# Patient Record
Sex: Female | Born: 1945 | Race: White | Hispanic: No | Marital: Single | Smoking: Current every day smoker
Health system: Southern US, Community
[De-identification: ages and names within clinical notes are randomized; demographics above are authoritative.]

## PROBLEM LIST (undated history)

## (undated) DIAGNOSIS — C801 Malignant (primary) neoplasm, unspecified: Secondary | ICD-10-CM

## (undated) DIAGNOSIS — M8580 Other specified disorders of bone density and structure, unspecified site: Secondary | ICD-10-CM

## (undated) DIAGNOSIS — R002 Palpitations: Secondary | ICD-10-CM

## (undated) DIAGNOSIS — R319 Hematuria, unspecified: Secondary | ICD-10-CM

## (undated) DIAGNOSIS — I10 Essential (primary) hypertension: Secondary | ICD-10-CM

## (undated) DIAGNOSIS — R51 Headache: Secondary | ICD-10-CM

## (undated) DIAGNOSIS — J4520 Mild intermittent asthma, uncomplicated: Secondary | ICD-10-CM

## (undated) DIAGNOSIS — K219 Gastro-esophageal reflux disease without esophagitis: Secondary | ICD-10-CM

## (undated) DIAGNOSIS — K589 Irritable bowel syndrome without diarrhea: Secondary | ICD-10-CM

## (undated) DIAGNOSIS — R159 Full incontinence of feces: Secondary | ICD-10-CM

## (undated) DIAGNOSIS — G43909 Migraine, unspecified, not intractable, without status migrainosus: Secondary | ICD-10-CM

## (undated) DIAGNOSIS — J381 Polyp of vocal cord and larynx: Secondary | ICD-10-CM

## (undated) DIAGNOSIS — M6289 Other specified disorders of muscle: Secondary | ICD-10-CM

## (undated) DIAGNOSIS — Z789 Other specified health status: Secondary | ICD-10-CM

## (undated) DIAGNOSIS — Z8679 Personal history of other diseases of the circulatory system: Secondary | ICD-10-CM

## (undated) DIAGNOSIS — E785 Hyperlipidemia, unspecified: Secondary | ICD-10-CM

## (undated) DIAGNOSIS — E039 Hypothyroidism, unspecified: Secondary | ICD-10-CM

## (undated) DIAGNOSIS — R29898 Other symptoms and signs involving the musculoskeletal system: Secondary | ICD-10-CM

## (undated) DIAGNOSIS — K2270 Barrett's esophagus without dysplasia: Secondary | ICD-10-CM

## (undated) DIAGNOSIS — Z860100 Personal history of colon polyps, unspecified: Secondary | ICD-10-CM

## (undated) DIAGNOSIS — H8120 Vestibular neuronitis, unspecified ear: Secondary | ICD-10-CM

## (undated) DIAGNOSIS — E7800 Pure hypercholesterolemia, unspecified: Secondary | ICD-10-CM

## (undated) DIAGNOSIS — R42 Dizziness and giddiness: Secondary | ICD-10-CM

## (undated) DIAGNOSIS — N281 Cyst of kidney, acquired: Secondary | ICD-10-CM

## (undated) DIAGNOSIS — I493 Ventricular premature depolarization: Secondary | ICD-10-CM

## (undated) DIAGNOSIS — G8929 Other chronic pain: Secondary | ICD-10-CM

## (undated) DIAGNOSIS — R519 Headache, unspecified: Secondary | ICD-10-CM

## (undated) HISTORY — DX: Hematuria, unspecified: R31.9

## (undated) HISTORY — DX: Hypothyroidism, unspecified: E03.9

## (undated) HISTORY — DX: Barrett's esophagus without dysplasia: K22.70

## (undated) HISTORY — DX: Polyp of vocal cord and larynx: J38.1

## (undated) HISTORY — DX: Other chronic pain: G89.29

## (undated) HISTORY — DX: Personal history of colonic polyps: Z86.010

## (undated) HISTORY — DX: Other symptoms and signs involving the musculoskeletal system: R29.898

## (undated) HISTORY — DX: Essential (primary) hypertension: I10

## (undated) HISTORY — DX: Cyst of kidney, acquired: N28.1

## (undated) HISTORY — DX: Other specified disorders of bone density and structure, unspecified site: M85.80

## (undated) HISTORY — DX: Malignant (primary) neoplasm, unspecified: C80.1

## (undated) HISTORY — DX: Personal history of other diseases of the circulatory system: Z86.79

## (undated) HISTORY — DX: Ventricular premature depolarization: I49.3

## (undated) HISTORY — DX: Full incontinence of feces: R15.9

## (undated) HISTORY — DX: Headache: R51

## (undated) HISTORY — DX: Other specified health status: Z78.9

## (undated) HISTORY — DX: Headache, unspecified: R51.9

## (undated) HISTORY — DX: Hyperlipidemia, unspecified: E78.5

## (undated) HISTORY — DX: Personal history of colon polyps, unspecified: Z86.0100

## (undated) HISTORY — PX: TUBAL LIGATION: SHX77

## (undated) HISTORY — DX: Migraine, unspecified, not intractable, without status migrainosus: G43.909

## (undated) HISTORY — DX: Pure hypercholesterolemia, unspecified: E78.00

## (undated) HISTORY — DX: Palpitations: R00.2

## (undated) HISTORY — DX: Irritable bowel syndrome, unspecified: K58.9

## (undated) HISTORY — DX: Vestibular neuronitis, unspecified ear: H81.20

## (undated) HISTORY — PX: OOPHORECTOMY: SHX86

## (undated) HISTORY — DX: Gastro-esophageal reflux disease without esophagitis: K21.9

## (undated) HISTORY — DX: Other specified disorders of muscle: M62.89

## (undated) HISTORY — DX: Dizziness and giddiness: R42

## (undated) HISTORY — DX: Mild intermittent asthma, uncomplicated: J45.20

---
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
## 2003-01-15 HISTORY — PX: BREAST LUMPECTOMY: SHX2

## 2008-06-28 ENCOUNTER — Other Ambulatory Visit: Admission: RE | Admit: 2008-06-28 | Discharge: 2008-06-28 | Payer: Self-pay | Admitting: Family Medicine

## 2008-07-20 ENCOUNTER — Encounter: Admission: RE | Admit: 2008-07-20 | Discharge: 2008-07-20 | Payer: Self-pay | Admitting: Family Medicine

## 2009-04-14 HISTORY — PX: COLONOSCOPY: SHX174

## 2009-07-21 ENCOUNTER — Encounter: Admission: RE | Admit: 2009-07-21 | Discharge: 2009-07-21 | Payer: Self-pay | Admitting: Family Medicine

## 2010-06-27 ENCOUNTER — Other Ambulatory Visit: Payer: Self-pay | Admitting: Family Medicine

## 2010-06-27 DIAGNOSIS — Z9889 Other specified postprocedural states: Secondary | ICD-10-CM

## 2010-07-10 ENCOUNTER — Other Ambulatory Visit: Payer: Self-pay | Admitting: Family Medicine

## 2010-07-10 DIAGNOSIS — Z780 Asymptomatic menopausal state: Secondary | ICD-10-CM

## 2010-07-23 ENCOUNTER — Other Ambulatory Visit: Payer: Self-pay

## 2010-07-26 ENCOUNTER — Ambulatory Visit
Admission: RE | Admit: 2010-07-26 | Discharge: 2010-07-26 | Disposition: A | Discharge status: Home / Self Care | Payer: Medicare Other | Source: Ambulatory Visit | Attending: Family Medicine | Admitting: Family Medicine

## 2010-07-26 DIAGNOSIS — Z9889 Other specified postprocedural states: Secondary | ICD-10-CM

## 2010-07-26 DIAGNOSIS — Z780 Asymptomatic menopausal state: Secondary | ICD-10-CM

## 2010-08-06 ENCOUNTER — Other Ambulatory Visit (HOSPITAL_COMMUNITY)
Admission: RE | Admit: 2010-08-06 | Discharge: 2010-08-06 | Disposition: A | Discharge status: Home / Self Care | Payer: Medicare Other | Source: Ambulatory Visit | Attending: Family Medicine | Admitting: Family Medicine

## 2010-08-06 ENCOUNTER — Other Ambulatory Visit: Payer: Self-pay | Admitting: Family Medicine

## 2010-08-06 DIAGNOSIS — Z124 Encounter for screening for malignant neoplasm of cervix: Secondary | ICD-10-CM | POA: Insufficient documentation

## 2011-03-13 DIAGNOSIS — M79609 Pain in unspecified limb: Secondary | ICD-10-CM | POA: Diagnosis not present

## 2011-03-13 DIAGNOSIS — M7150 Other bursitis, not elsewhere classified, unspecified site: Secondary | ICD-10-CM | POA: Diagnosis not present

## 2011-03-13 DIAGNOSIS — M7730 Calcaneal spur, unspecified foot: Secondary | ICD-10-CM | POA: Diagnosis not present

## 2011-03-13 DIAGNOSIS — M7660 Achilles tendinitis, unspecified leg: Secondary | ICD-10-CM | POA: Diagnosis not present

## 2011-03-20 DIAGNOSIS — M659 Synovitis and tenosynovitis, unspecified: Secondary | ICD-10-CM | POA: Diagnosis not present

## 2011-03-20 DIAGNOSIS — M7150 Other bursitis, not elsewhere classified, unspecified site: Secondary | ICD-10-CM | POA: Diagnosis not present

## 2011-03-20 DIAGNOSIS — M65979 Unspecified synovitis and tenosynovitis, unspecified ankle and foot: Secondary | ICD-10-CM | POA: Diagnosis not present

## 2011-03-20 DIAGNOSIS — G5750 Tarsal tunnel syndrome, unspecified lower limb: Secondary | ICD-10-CM | POA: Diagnosis not present

## 2011-03-29 DIAGNOSIS — M65979 Unspecified synovitis and tenosynovitis, unspecified ankle and foot: Secondary | ICD-10-CM | POA: Diagnosis not present

## 2011-03-29 DIAGNOSIS — IMO0002 Reserved for concepts with insufficient information to code with codable children: Secondary | ICD-10-CM | POA: Diagnosis not present

## 2011-03-29 DIAGNOSIS — M659 Synovitis and tenosynovitis, unspecified: Secondary | ICD-10-CM | POA: Diagnosis not present

## 2011-06-18 DIAGNOSIS — R002 Palpitations: Secondary | ICD-10-CM | POA: Diagnosis not present

## 2011-07-01 ENCOUNTER — Other Ambulatory Visit: Payer: Self-pay | Admitting: Family Medicine

## 2011-07-01 DIAGNOSIS — Z1231 Encounter for screening mammogram for malignant neoplasm of breast: Secondary | ICD-10-CM

## 2011-07-10 DIAGNOSIS — F17200 Nicotine dependence, unspecified, uncomplicated: Secondary | ICD-10-CM | POA: Diagnosis not present

## 2011-07-10 DIAGNOSIS — R030 Elevated blood-pressure reading, without diagnosis of hypertension: Secondary | ICD-10-CM | POA: Diagnosis not present

## 2011-07-10 DIAGNOSIS — R079 Chest pain, unspecified: Secondary | ICD-10-CM | POA: Diagnosis not present

## 2011-07-10 DIAGNOSIS — I059 Rheumatic mitral valve disease, unspecified: Secondary | ICD-10-CM | POA: Diagnosis not present

## 2011-07-10 DIAGNOSIS — R002 Palpitations: Secondary | ICD-10-CM | POA: Diagnosis not present

## 2011-07-11 DIAGNOSIS — G5610 Other lesions of median nerve, unspecified upper limb: Secondary | ICD-10-CM | POA: Diagnosis not present

## 2011-07-11 DIAGNOSIS — IMO0002 Reserved for concepts with insufficient information to code with codable children: Secondary | ICD-10-CM | POA: Diagnosis not present

## 2011-07-11 DIAGNOSIS — R634 Abnormal weight loss: Secondary | ICD-10-CM | POA: Diagnosis not present

## 2011-07-11 DIAGNOSIS — G43909 Migraine, unspecified, not intractable, without status migrainosus: Secondary | ICD-10-CM | POA: Diagnosis not present

## 2011-07-11 DIAGNOSIS — G609 Hereditary and idiopathic neuropathy, unspecified: Secondary | ICD-10-CM | POA: Diagnosis not present

## 2011-07-11 DIAGNOSIS — D539 Nutritional anemia, unspecified: Secondary | ICD-10-CM | POA: Diagnosis not present

## 2011-07-11 DIAGNOSIS — R209 Unspecified disturbances of skin sensation: Secondary | ICD-10-CM | POA: Diagnosis not present

## 2011-07-12 DIAGNOSIS — R002 Palpitations: Secondary | ICD-10-CM | POA: Diagnosis not present

## 2011-07-17 DIAGNOSIS — R030 Elevated blood-pressure reading, without diagnosis of hypertension: Secondary | ICD-10-CM | POA: Diagnosis not present

## 2011-07-17 DIAGNOSIS — R079 Chest pain, unspecified: Secondary | ICD-10-CM | POA: Diagnosis not present

## 2011-07-17 DIAGNOSIS — R0602 Shortness of breath: Secondary | ICD-10-CM | POA: Diagnosis not present

## 2011-07-17 DIAGNOSIS — R002 Palpitations: Secondary | ICD-10-CM | POA: Diagnosis not present

## 2011-07-17 DIAGNOSIS — I059 Rheumatic mitral valve disease, unspecified: Secondary | ICD-10-CM | POA: Diagnosis not present

## 2011-07-25 DIAGNOSIS — R002 Palpitations: Secondary | ICD-10-CM | POA: Diagnosis not present

## 2011-07-25 DIAGNOSIS — R030 Elevated blood-pressure reading, without diagnosis of hypertension: Secondary | ICD-10-CM | POA: Diagnosis not present

## 2011-07-29 ENCOUNTER — Ambulatory Visit: Payer: Medicare Other

## 2011-08-12 ENCOUNTER — Ambulatory Visit
Admission: RE | Admit: 2011-08-12 | Discharge: 2011-08-12 | Disposition: A | Discharge status: Home / Self Care | Payer: Medicare Other | Source: Ambulatory Visit | Attending: Family Medicine | Admitting: Family Medicine

## 2011-08-12 DIAGNOSIS — Z1231 Encounter for screening mammogram for malignant neoplasm of breast: Secondary | ICD-10-CM

## 2011-08-12 DIAGNOSIS — R209 Unspecified disturbances of skin sensation: Secondary | ICD-10-CM | POA: Diagnosis not present

## 2011-08-12 DIAGNOSIS — G609 Hereditary and idiopathic neuropathy, unspecified: Secondary | ICD-10-CM | POA: Diagnosis not present

## 2011-08-12 DIAGNOSIS — G43909 Migraine, unspecified, not intractable, without status migrainosus: Secondary | ICD-10-CM | POA: Diagnosis not present

## 2011-08-23 DIAGNOSIS — R002 Palpitations: Secondary | ICD-10-CM | POA: Diagnosis not present

## 2011-08-23 DIAGNOSIS — R05 Cough: Secondary | ICD-10-CM | POA: Diagnosis not present

## 2011-08-23 DIAGNOSIS — R030 Elevated blood-pressure reading, without diagnosis of hypertension: Secondary | ICD-10-CM | POA: Diagnosis not present

## 2011-08-26 DIAGNOSIS — L821 Other seborrheic keratosis: Secondary | ICD-10-CM | POA: Diagnosis not present

## 2011-08-26 DIAGNOSIS — C44711 Basal cell carcinoma of skin of unspecified lower limb, including hip: Secondary | ICD-10-CM | POA: Diagnosis not present

## 2011-08-26 DIAGNOSIS — Z85828 Personal history of other malignant neoplasm of skin: Secondary | ICD-10-CM | POA: Diagnosis not present

## 2011-08-26 DIAGNOSIS — D485 Neoplasm of uncertain behavior of skin: Secondary | ICD-10-CM | POA: Diagnosis not present

## 2011-09-18 ENCOUNTER — Ambulatory Visit (INDEPENDENT_AMBULATORY_CARE_PROVIDER_SITE_OTHER)
Admission: RE | Admit: 2011-09-18 | Discharge: 2011-09-18 | Disposition: A | Discharge status: Home / Self Care | Payer: Medicare Other | Source: Ambulatory Visit | Attending: Internal Medicine | Admitting: Internal Medicine

## 2011-09-18 ENCOUNTER — Ambulatory Visit (INDEPENDENT_AMBULATORY_CARE_PROVIDER_SITE_OTHER): Payer: Medicare Other | Admitting: Internal Medicine

## 2011-09-18 ENCOUNTER — Encounter: Payer: Self-pay | Admitting: Internal Medicine

## 2011-09-18 VITALS — BP 142/70 | HR 92 | Temp 98.1°F | Ht 66.5 in | Wt 174.8 lb

## 2011-09-18 DIAGNOSIS — R059 Cough, unspecified: Secondary | ICD-10-CM

## 2011-09-18 DIAGNOSIS — R05 Cough: Secondary | ICD-10-CM

## 2011-09-18 DIAGNOSIS — F17200 Nicotine dependence, unspecified, uncomplicated: Secondary | ICD-10-CM | POA: Diagnosis not present

## 2011-09-18 DIAGNOSIS — R079 Chest pain, unspecified: Secondary | ICD-10-CM | POA: Diagnosis not present

## 2011-09-18 NOTE — Patient Instructions (Addendum)
Please remember to go to the x-ray department downstairs for your tests - we will call you with the results when they are available.     Prilosec 20 mg (omeprazole) Take 30-60 min before first meal of the day and Pepcid 20 mg one bedtime  GERD (REFLUX)  is an extremely common cause of respiratory symptoms like your cough, many times with no significant heartburn at all.    It can be treated with medication, but also with lifestyle changes including avoidance of late meals, excessive alcohol, smoking cessation, and avoid fatty foods, chocolate, peppermint, colas, red wine, and acidic juices such as orange juice.  NO MINT OR MENTHOL PRODUCTS SO NO COUGH DROPS  USE SUGARLESS CANDY INSTEAD (jolley ranchers or Stover's)  NO OIL BASED VITAMINS - use powdered substitutes.  Mucinex dm 1200 mg twice daily as needed for cough suppression - try not to cough  Please schedule a follow up office visit in 4 weeks, sooner if needed

## 2011-09-18 NOTE — Progress Notes (Signed)
  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   09/18/2011 1st pulmonary cc cough since 2006 mostly at night at onset but x 3 years worst thing in am and all day long > occ plugs of clear mucus assoc with chest discomfort when severe cp that migrates around front / back / bilateral seems worse since Aug 2012 - occ specks of blood.  Other than when coughing she denies being limited by sob  Sleeping ok without nocturnal  or early am exacerbation  of respiratory  c/o's or need for noct saba. Also denies any obvious fluctuation of symptoms with weather or environmental changes or other aggravating or alleviating factors except as outlined above - never treated - has abn cxr dating back decades.     Review of Systems  Constitutional: Positive for diaphoresis. Negative for fever, chills, activity change, appetite change, fatigue and unexpected weight change.  HENT: Negative for nosebleeds, congestion, sore throat, rhinorrhea, sneezing, trouble swallowing, neck pain, postnasal drip, sinus pressure and tinnitus.   Eyes: Negative for visual disturbance.  Respiratory: Positive for cough, chest tightness and shortness of breath. Negative for apnea, choking, wheezing and stridor.   Cardiovascular: Positive for chest pain and leg swelling. Negative for palpitations.  Gastrointestinal: Negative for nausea, vomiting, abdominal pain, constipation and blood in stool.  Genitourinary: Negative for difficulty urinating.  Musculoskeletal: Negative for myalgias, back pain and gait problem.  Skin: Negative for rash.  Neurological: Negative for dizziness, tremors, seizures, syncope, speech difficulty, weakness, light-headedness, numbness and headaches.  Hematological: Does not bruise/bleed easily.  Psychiatric/Behavioral: Negative for confusion, disturbed wake/sleep  cycle and agitation. The patient is not nervous/anxious.        Objective:   Physical Exam  Wt Readings from Last 3 Encounters:  09/18/11 174 lb 12.8 oz (79.289 kg)    amb wf Patient failed to answer a single question asked in a straightforward manner, tending to go off on tangents or answer questions with ambiguous medical terms or diagnoses and seemed aggravated  when asked the same question more than once for clarification.  HEENT: nl dentition, turbinates, and orophanx. Nl external ear canals without cough reflex   NECK :  without JVD/Nodes/TM/ nl carotid upstrokes bilaterally   LUNGS: no acc muscle use, clear to A and P bilaterally without cough on insp or exp maneuvers   CV:  RRR  no s3 or murmur or increase in P2, no edema   ABD:  soft and nontender with nl excursion in the supine position. No bruits or organomegaly, bowel sounds nl  MS:  warm without deformities, calf tenderness, cyanosis or clubbing  SKIN: warm and dry without lesions    NEURO:  alert, approp, no deficits     CXR  09/18/2011 :    Findings: Trachea is midline. Heart size normal. Pulmonary arteries appear somewhat prominent. Added density in the right infrahilar region appears grossly stable when slight differences in technique are considered. Linear scarring at the left lung base. No pleural fluid.  IMPRESSION: No acute findings.      Assessment & Plan:

## 2011-09-19 DIAGNOSIS — F17200 Nicotine dependence, unspecified, uncomplicated: Secondary | ICD-10-CM | POA: Insufficient documentation

## 2011-09-19 NOTE — Assessment & Plan Note (Signed)
The most common causes of chronic cough in immunocompetent adults include the following: upper airway cough syndrome (UACS), previously referred to as postnasal drip syndrome (PNDS), which is caused by variety of rhinosinus conditions; (2) asthma; (3) GERD; (4) chronic bronchitis from cigarette smoking or other inhaled environmental irritants; (5) nonasthmatic eosinophilic bronchitis; and (6) bronchiectasis.   These conditions, singly or in combination, have accounted for up to 94% of the causes of chronic cough in prospective studies.   Other conditions have constituted no >6% of the causes in prospective studies These have included bronchogenic carcinoma, chronic interstitial pneumonia, sarcoidosis, left ventricular failure, ACEI-induced cough, and aspiration from a condition associated with pharyngeal dysfunction.   .Chronic cough is often simultaneously caused by more than one condition. A single cause has been found from 38 to 82% of the time, multiple causes from 18 to 62%. Multiply caused cough has been the result of three diseases up to 42% of the time.     Reviewed with pt: The standardized cough guidelines recently published in Chest by [place] in 2006  are a multiple step process (up to 12!) , not a single office visit,  and are intended  to address this problem logically,  with an alogrithm dependent on response to empiric treatment at  each progressive step  to determine a specific diagnosis with  minimal addtional testing needed. Therefore if compliance is an issue or can't be accurately verified then it's very unlikely the standard evaluation and treatment will be successful here.    Furthermore, response to therapy (other than acute cough suppression, which should only be used short term with avoidance of narcotic containing cough syrups if possible), can be a gradual process for which the patient may not receive immediate benefit.  Unlike going to an eye doctor where the right  rx is almost always the first one and is immediately effective, this is almost never the case in the management of chronic cough syndromes and the patient needs to commit up front to compliance with recommendations and have the patience to wait out a response for up to 6 weeks of therapy directed at the likely underlying problem(s).    For now max rx for gerd consistently along with diet and stop smoking then regroup in one month with pfts

## 2011-09-19 NOTE — Assessment & Plan Note (Signed)

## 2011-09-19 NOTE — Progress Notes (Signed)
Quick Note:  Spoke with pt and notified of results per Dr. Wert. Pt verbalized understanding and denied any questions.  ______ 

## 2011-09-20 DIAGNOSIS — R030 Elevated blood-pressure reading, without diagnosis of hypertension: Secondary | ICD-10-CM | POA: Diagnosis not present

## 2011-09-20 DIAGNOSIS — R002 Palpitations: Secondary | ICD-10-CM | POA: Diagnosis not present

## 2011-10-18 ENCOUNTER — Encounter: Payer: Medicare Other | Admitting: Internal Medicine

## 2011-10-18 NOTE — Progress Notes (Signed)
 This encounter was created in error - please disregard.

## 2011-10-29 ENCOUNTER — Encounter: Payer: Self-pay | Admitting: Internal Medicine

## 2011-10-29 ENCOUNTER — Ambulatory Visit (INDEPENDENT_AMBULATORY_CARE_PROVIDER_SITE_OTHER): Payer: Medicare Other | Admitting: Internal Medicine

## 2011-10-29 VITALS — BP 126/80 | HR 85 | Temp 97.8°F | Ht 66.5 in | Wt 169.8 lb

## 2011-10-29 DIAGNOSIS — R05 Cough: Secondary | ICD-10-CM | POA: Diagnosis not present

## 2011-10-29 DIAGNOSIS — F17200 Nicotine dependence, unspecified, uncomplicated: Secondary | ICD-10-CM | POA: Diagnosis not present

## 2011-10-29 NOTE — Progress Notes (Signed)
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   09/18/2011 1st pulmonary cc cough since 2006 mostly at night at onset but x 3 years worst thing in am and all day long > occ plugs of clear mucus assoc with chest discomfort when severe cp that migrates around front / back / bilateral seems worse since Aug 2012 - occ specks of blood.  Other than when coughing she denies being limited by sob  rec Prilosec 20 mg (omeprazole) Take 30-60 min before first meal of the day and Pepcid 20 mg one bedtime GERD  diet Mucinex dm 1200 mg twice daily as needed for cough suppression - try not to cough Please schedule a follow up office visit in 4 weeks, sooner if needed   10/18/2011 f/u ov/Wert cc missed appt  10/29/2011 f/u ov/Wert active smoker cc cough some better and now tolerable level and not using as much mucinex dm,  No excess mucus or obvious daytime variabilty or assoc   cp or chest tightness, subjective wheeze overt sinus or hb symptoms. No unusual exp hx   Sleeping ok without nocturnal  or early am exacerbation  of respiratory  c/o's or need for noct saba. Also denies any obvious fluctuation of symptoms with weather or environmental changes or other aggravating or alleviating factors except as outlined above - never treated - has abn cxr dating back decades.    ROS  The following are not active complaints unless bolded sore throat, dysphagia, dental problems, itching, sneezing,  nasal congestion or excess/ purulent secretions, ear ache,   fever, chills, sweats, unintended wt loss, pleuritic or exertional cp, hemoptysis,  orthopnea pnd or leg swelling, presyncope, palpitations, heartburn, abdominal pain, anorexia, nausea, vomiting, diarrhea  or change in bowel or urinary habits, change in stools or urine, dysuria,hematuria,  rash, arthralgias, visual  complaints, headache, numbness weakness or ataxia or problems with walking or coordination,  change in mood/affect or memory.             Objective:   Physical Exam Wt 10/29/2011 169 Wt Readings from Last 3 Encounters:  09/18/11 174 lb 12.8 oz (79.289 kg)    amb wf Patient failed to answer a single question asked in a straightforward manner, tending to go off on tangents or answer questions with ambiguous medical terms or diagnoses and seemed aggravated  when asked the same question more than once for clarification.  HEENT: nl dentition, turbinates, and orophanx. Nl external ear canals without cough reflex   NECK :  without JVD/Nodes/TM/ nl carotid upstrokes bilaterally   LUNGS: no acc muscle use, clear to A and P bilaterally without cough on insp or exp maneuvers   CV:  RRR  no s3 or murmur or increase in P2, no edema   ABD:  soft and nontender with nl excursion in the supine position. No bruits or organomegaly, bowel sounds nl  MS:  warm without deformities, calf tenderness, cyanosis or clubbing  SKIN: warm and dry without lesions    NEURO:  alert, approp, no deficits     CXR  09/18/2011 :    Findings: Trachea is midline. Heart size normal. Pulmonary arteries appear somewhat prominent. Added density in the right infrahilar region appears grossly stable when slight differences in technique are considered. Linear scarring at the left lung base. No pleural fluid.  IMPRESSION: No acute findings.      Assessment & Plan:

## 2011-10-29 NOTE — Assessment & Plan Note (Addendum)
Better with rx for gerd typical of upper airway cough syndrome- should continue rx and diet, reviewed.  See instructions for specific recommendations which were reviewed directly with the patient who was given a copy with highlighter outlining the key components.

## 2011-10-29 NOTE — Assessment & Plan Note (Signed)

## 2011-10-29 NOTE — Patient Instructions (Addendum)
No change recommendations except chlortrimeton 4mg  at bedtime to see if helps your nasal drip and helps you sleep  Continue prilosec 20mg  Take 30-60 min before first meal of the day and the pepcid 20 mg one at bedtime  GERD (REFLUX)  is an extremely common cause of respiratory symptoms, many times with no significant heartburn at all.    It can be treated with medication, but also with lifestyle changes including avoidance of late meals, excessive alcohol, smoking cessation, and avoid fatty foods, chocolate, peppermint, colas, red wine, and acidic juices such as orange juice.  NO MINT OR MENTHOL PRODUCTS SO NO COUGH DROPS  USE SUGARLESS CANDY INSTEAD (jolley ranchers or Stover's)  NO OIL BASED VITAMINS - use powdered substitutes.    Continue to use mucinex dm as needed with max dose of 1200 mg every 12 hours   Please schedule a follow up office visit in 6 weeks, call sooner if needed with pft's on return.

## 2011-11-08 DIAGNOSIS — E785 Hyperlipidemia, unspecified: Secondary | ICD-10-CM | POA: Diagnosis not present

## 2011-11-08 DIAGNOSIS — R002 Palpitations: Secondary | ICD-10-CM | POA: Diagnosis not present

## 2011-11-12 DIAGNOSIS — H2510 Age-related nuclear cataract, unspecified eye: Secondary | ICD-10-CM | POA: Diagnosis not present

## 2011-12-16 DIAGNOSIS — N281 Cyst of kidney, acquired: Secondary | ICD-10-CM | POA: Diagnosis not present

## 2011-12-16 DIAGNOSIS — R3129 Other microscopic hematuria: Secondary | ICD-10-CM | POA: Diagnosis not present

## 2011-12-18 ENCOUNTER — Ambulatory Visit: Payer: Medicare Other | Admitting: Internal Medicine

## 2011-12-20 DIAGNOSIS — C44711 Basal cell carcinoma of skin of unspecified lower limb, including hip: Secondary | ICD-10-CM | POA: Diagnosis not present

## 2011-12-20 DIAGNOSIS — C4491 Basal cell carcinoma of skin, unspecified: Secondary | ICD-10-CM | POA: Diagnosis not present

## 2011-12-23 DIAGNOSIS — E785 Hyperlipidemia, unspecified: Secondary | ICD-10-CM | POA: Diagnosis not present

## 2011-12-23 DIAGNOSIS — E039 Hypothyroidism, unspecified: Secondary | ICD-10-CM | POA: Diagnosis not present

## 2012-01-27 ENCOUNTER — Ambulatory Visit (INDEPENDENT_AMBULATORY_CARE_PROVIDER_SITE_OTHER): Payer: Medicare Other | Admitting: Internal Medicine

## 2012-01-27 ENCOUNTER — Encounter: Payer: Self-pay | Admitting: Internal Medicine

## 2012-01-27 VITALS — BP 110/70 | HR 79 | Temp 97.5°F | Ht 67.0 in | Wt 163.0 lb

## 2012-01-27 DIAGNOSIS — J439 Emphysema, unspecified: Secondary | ICD-10-CM | POA: Insufficient documentation

## 2012-01-27 DIAGNOSIS — J449 Chronic obstructive pulmonary disease, unspecified: Secondary | ICD-10-CM | POA: Diagnosis not present

## 2012-01-27 DIAGNOSIS — R05 Cough: Secondary | ICD-10-CM

## 2012-01-27 DIAGNOSIS — F17200 Nicotine dependence, unspecified, uncomplicated: Secondary | ICD-10-CM

## 2012-01-27 LAB — PULMONARY FUNCTION TEST

## 2012-01-27 MED ORDER — VARENICLINE TARTRATE 0.5 MG X 11 & 1 MG X 42 PO MISC: ORAL | Status: DC

## 2012-01-27 NOTE — Progress Notes (Signed)
PFT done today. 

## 2012-01-27 NOTE — Patient Instructions (Addendum)
Set a quit date,  Then start chantix a week before - it is not too late to quit!  Please see patient coordinator before you leave today  to schedule sinus ct  Next step is to see your ENT doctor and in meantime use mucinex dm 1200 mg every hours as you feel it helps  Pulmonary follow up is as needed

## 2012-01-27 NOTE — Assessment & Plan Note (Signed)
>  3 min discussion  I reviewed the Flethcher curve with patient that basically indicates  if you quit smoking when your best day FEV1 is still well preserved (which hers clearly is) it is highly unlikely you will progress to severe disease and informed the patient there was no medication on the market that has proven to change the curve or the likelihood of progression.  Therefore stopping smoking and maintaining abstinence is the most important aspect of care, not choice of inhalers or for that matter, doctors.

## 2012-01-27 NOTE — Assessment & Plan Note (Signed)
-   PFT's 01/27/2012 FEV1  2.03 (85%) ratio 68 no better p B2,  67%  GOLD I and no indication to rx x stop smoking, reviewed separately

## 2012-01-27 NOTE — Progress Notes (Signed)
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   09/18/2011 1st pulmonary cc cough since 2006 mostly at night at onset but x 3 years worst thing in am and all day long > occ plugs of clear mucus assoc with chest discomfort when severe cp that migrates around front / back / bilateral seems worse since Aug 2012 - occ specks of blood.  Other than when coughing she denies being limited by sob  rec Prilosec 20 mg (omeprazole) Take 30-60 min before first meal of the day and Pepcid 20 mg one bedtime GERD  diet Mucinex dm 1200 mg twice daily as needed for cough suppression - try not to cough Please schedule a follow up office visit in 4 weeks, sooner if needed   10/18/2011 f/u ov/Wert cc missed appt  10/29/2011 f/u ov/Wert active smoker cc cough some better and now tolerable level and not using as much mucinex dm,  No excess mucus or obvious daytime variabilty or assoc   cp or chest tightness, subjective wheeze overt sinus or hb symptoms. No unusual exp hx  rec No change recommendations except chlortrimeton 4mg  at bedtime to see if helps your nasal drip and helps you sleep Continue prilosec 20mg  Take 30-60 min before first meal of the day and the pepcid 20 mg one at bedtime GERD   Continue to use mucinex dm as needed with max dose of 1200 mg every 12 hours    01/27/2012 f/u ov/Wert still smoking, cough is worse in am but does not wake her up thick plug around tsp and then at night "croupy" before supper but no sob and sleeping ok without nocturnal exacerbation  of respiratory  c/o's or need for noct saba. Also denies any obvious fluctuation of symptoms with weather or environmental changes or other aggravating or alleviating factors except as outlined above - never treated      ROS  The following are not active complaints unless bolded sore throat,  dysphagia, dental problems, itching, sneezing,  nasal congestion or excess/ purulent secretions, ear ache,   fever, chills, sweats, unintended wt loss, pleuritic or exertional cp, hemoptysis,  orthopnea pnd or leg swelling, presyncope, palpitations, heartburn, abdominal pain, anorexia, nausea, vomiting, diarrhea  or change in bowel or urinary habits, change in stools or urine, dysuria,hematuria,  rash, arthralgias, visual complaints, headache, numbness weakness or ataxia or problems with walking or coordination,  change in mood/affect or memory.             Objective:   Physical Exam Wt 10/29/2011 169> 163 01/27/2012  Wt Readings from Last 3 Encounters:  09/18/11 174 lb 12.8 oz (79.289 kg)    amb wf Patient failed to answer a single question asked in a straightforward manner, tending to go off on tangents or answer questions with ambiguous medical terms or diagnoses and seemed aggravated  when asked the same question more than once for clarification.  HEENT: nl dentition, turbinates, and orophanx. Nl external ear canals without cough reflex   NECK :  without JVD/Nodes/TM/ nl carotid upstrokes bilaterally   LUNGS: no acc muscle use, clear to A and P bilaterally without cough on insp or exp maneuvers   CV:  RRR  no s3 or murmur or increase in P2, no edema   ABD:  soft and nontender with nl excursion in the  supine position. No bruits or organomegaly, bowel sounds nl  MS:  warm without deformities, calf tenderness, cyanosis or clubbing  SKIN: warm and dry without lesions    NEURO:  alert, approp, no deficits     CXR  09/18/2011 :    Findings: Trachea is midline. Heart size normal. Pulmonary arteries appear somewhat prominent. Added density in the right infrahilar region appears grossly stable when slight differences in technique are considered. Linear scarring at the left lung base. No pleural fluid.  IMPRESSION: No acute findings.      Assessment & Plan:

## 2012-01-27 NOTE — Assessment & Plan Note (Addendum)
The most common causes of chronic cough in immunocompetent adults include the following: upper airway cough syndrome (UACS), previously referred to as postnasal drip syndrome (PNDS), which is caused by variety of rhinosinus conditions; (2) asthma; (3) GERD; (4) chronic bronchitis from cigarette smoking or other inhaled environmental irritants; (5) nonasthmatic eosinophilic bronchitis; and (6) bronchiectasis.   These conditions, singly or in combination, have accounted for up to 94% of the causes of chronic cough in prospective studies.   Other conditions have constituted no >6% of the causes in prospective studies These have included bronchogenic carcinoma, chronic interstitial pneumonia, sarcoidosis, left ventricular failure, ACEI-induced cough, and aspiration from a condition associated with pharyngeal dysfunction.    Chronic cough is often simultaneously caused by more than one condition. A single cause has been found from 38 to 82% of the time, multiple causes from 18 to 62%. Multiply caused cough has been the result of three diseases up to 42% of the time.       Most likely this is  Classic Upper airway cough syndrome, so named because it's frequently impossible to sort out how much is  CR/sinusitis with freq throat clearing (which can be related to primary GERD)   vs  causing  secondary (" extra esophageal")  GERD from wide swings in gastric pressure that occur with throat clearing, often  promoting self use of mint and menthol lozenges that reduce the lower esophageal sphincter tone and exacerbate the problem further in a cyclical fashion.   These are the same pts (now being labeled as having "irritable larynx syndrome" by some cough centers) who not infrequently have a history of having failed to tolerate ace inhibitors,  dry powder inhalers or biphosphonates or report having atypical reflux symptoms that don't respond to standard doses of PPI , and are easily confused as having aecopd or asthma  flares by even experienced allergists/ pulmonologists.  No response to 1st gen H1 so next step is sinus ct and ent re-eval

## 2012-01-29 ENCOUNTER — Ambulatory Visit (INDEPENDENT_AMBULATORY_CARE_PROVIDER_SITE_OTHER)
Admission: RE | Admit: 2012-01-29 | Discharge: 2012-01-29 | Disposition: A | Discharge status: Home / Self Care | Payer: Medicare Other | Source: Ambulatory Visit | Attending: Internal Medicine | Admitting: Internal Medicine

## 2012-01-29 DIAGNOSIS — J322 Chronic ethmoidal sinusitis: Secondary | ICD-10-CM | POA: Diagnosis not present

## 2012-01-29 DIAGNOSIS — R05 Cough: Secondary | ICD-10-CM | POA: Diagnosis not present

## 2012-01-29 DIAGNOSIS — J320 Chronic maxillary sinusitis: Secondary | ICD-10-CM | POA: Diagnosis not present

## 2012-01-30 ENCOUNTER — Telehealth: Payer: Self-pay | Admitting: Internal Medicine

## 2012-01-30 ENCOUNTER — Encounter: Payer: Self-pay | Admitting: Internal Medicine

## 2012-01-30 NOTE — Telephone Encounter (Signed)
Pt is aware of results. 

## 2012-02-03 DIAGNOSIS — R490 Dysphonia: Secondary | ICD-10-CM | POA: Diagnosis not present

## 2012-02-03 DIAGNOSIS — H6120 Impacted cerumen, unspecified ear: Secondary | ICD-10-CM | POA: Diagnosis not present

## 2012-02-03 DIAGNOSIS — J322 Chronic ethmoidal sinusitis: Secondary | ICD-10-CM | POA: Diagnosis not present

## 2012-02-06 DIAGNOSIS — IMO0002 Reserved for concepts with insufficient information to code with codable children: Secondary | ICD-10-CM | POA: Diagnosis not present

## 2012-02-06 DIAGNOSIS — G609 Hereditary and idiopathic neuropathy, unspecified: Secondary | ICD-10-CM | POA: Diagnosis not present

## 2012-02-06 DIAGNOSIS — G43909 Migraine, unspecified, not intractable, without status migrainosus: Secondary | ICD-10-CM | POA: Diagnosis not present

## 2012-02-06 DIAGNOSIS — M531 Cervicobrachial syndrome: Secondary | ICD-10-CM | POA: Diagnosis not present

## 2012-02-13 DIAGNOSIS — R209 Unspecified disturbances of skin sensation: Secondary | ICD-10-CM | POA: Diagnosis not present

## 2012-02-13 DIAGNOSIS — G609 Hereditary and idiopathic neuropathy, unspecified: Secondary | ICD-10-CM | POA: Diagnosis not present

## 2012-02-17 ENCOUNTER — Encounter: Payer: Self-pay | Admitting: Internal Medicine

## 2012-02-21 DIAGNOSIS — IMO0002 Reserved for concepts with insufficient information to code with codable children: Secondary | ICD-10-CM | POA: Diagnosis not present

## 2012-02-21 DIAGNOSIS — M999 Biomechanical lesion, unspecified: Secondary | ICD-10-CM | POA: Diagnosis not present

## 2012-02-21 DIAGNOSIS — M5137 Other intervertebral disc degeneration, lumbosacral region: Secondary | ICD-10-CM | POA: Diagnosis not present

## 2012-03-23 DIAGNOSIS — L570 Actinic keratosis: Secondary | ICD-10-CM | POA: Diagnosis not present

## 2012-03-23 DIAGNOSIS — Z85828 Personal history of other malignant neoplasm of skin: Secondary | ICD-10-CM | POA: Diagnosis not present

## 2012-05-28 DIAGNOSIS — R002 Palpitations: Secondary | ICD-10-CM | POA: Diagnosis not present

## 2012-05-28 DIAGNOSIS — E785 Hyperlipidemia, unspecified: Secondary | ICD-10-CM | POA: Diagnosis not present

## 2012-05-28 DIAGNOSIS — R030 Elevated blood-pressure reading, without diagnosis of hypertension: Secondary | ICD-10-CM | POA: Diagnosis not present

## 2012-07-14 ENCOUNTER — Other Ambulatory Visit: Payer: Self-pay | Admitting: Family Medicine

## 2012-07-14 DIAGNOSIS — M899 Disorder of bone, unspecified: Secondary | ICD-10-CM

## 2012-07-15 ENCOUNTER — Other Ambulatory Visit: Payer: Self-pay | Admitting: Family Medicine

## 2012-07-15 ENCOUNTER — Other Ambulatory Visit: Payer: Self-pay

## 2012-07-15 DIAGNOSIS — Z1231 Encounter for screening mammogram for malignant neoplasm of breast: Secondary | ICD-10-CM

## 2012-08-06 DIAGNOSIS — Z85828 Personal history of other malignant neoplasm of skin: Secondary | ICD-10-CM | POA: Diagnosis not present

## 2012-08-06 DIAGNOSIS — C44319 Basal cell carcinoma of skin of other parts of face: Secondary | ICD-10-CM | POA: Diagnosis not present

## 2012-08-06 DIAGNOSIS — D485 Neoplasm of uncertain behavior of skin: Secondary | ICD-10-CM | POA: Diagnosis not present

## 2012-08-06 DIAGNOSIS — L821 Other seborrheic keratosis: Secondary | ICD-10-CM | POA: Diagnosis not present

## 2012-08-12 ENCOUNTER — Ambulatory Visit
Admission: RE | Admit: 2012-08-12 | Discharge: 2012-08-12 | Disposition: A | Discharge status: Home / Self Care | Payer: Medicare Other | Source: Ambulatory Visit | Attending: Family Medicine | Admitting: Family Medicine

## 2012-08-12 ENCOUNTER — Ambulatory Visit
Admission: RE | Admit: 2012-08-12 | Discharge: 2012-08-12 | Disposition: A | Discharge status: Home / Self Care | Payer: Medicare Other | Source: Ambulatory Visit

## 2012-08-12 DIAGNOSIS — Z1231 Encounter for screening mammogram for malignant neoplasm of breast: Secondary | ICD-10-CM | POA: Diagnosis not present

## 2012-08-12 DIAGNOSIS — M899 Disorder of bone, unspecified: Secondary | ICD-10-CM | POA: Diagnosis not present

## 2012-08-12 DIAGNOSIS — M810 Age-related osteoporosis without current pathological fracture: Secondary | ICD-10-CM | POA: Diagnosis not present

## 2012-08-12 DIAGNOSIS — M949 Disorder of cartilage, unspecified: Secondary | ICD-10-CM | POA: Diagnosis not present

## 2012-08-13 ENCOUNTER — Other Ambulatory Visit: Payer: Self-pay | Admitting: Family Medicine

## 2012-08-13 DIAGNOSIS — R928 Other abnormal and inconclusive findings on diagnostic imaging of breast: Secondary | ICD-10-CM

## 2012-08-14 HISTORY — PX: SKIN SURGERY: SHX2413

## 2012-08-25 DIAGNOSIS — Z0000 Encounter for general adult medical examination without abnormal findings: Secondary | ICD-10-CM | POA: Diagnosis not present

## 2012-08-25 DIAGNOSIS — E785 Hyperlipidemia, unspecified: Secondary | ICD-10-CM | POA: Diagnosis not present

## 2012-08-25 DIAGNOSIS — E039 Hypothyroidism, unspecified: Secondary | ICD-10-CM | POA: Diagnosis not present

## 2012-08-25 DIAGNOSIS — Z23 Encounter for immunization: Secondary | ICD-10-CM | POA: Diagnosis not present

## 2012-08-25 DIAGNOSIS — M810 Age-related osteoporosis without current pathological fracture: Secondary | ICD-10-CM | POA: Diagnosis not present

## 2012-08-27 ENCOUNTER — Ambulatory Visit
Admission: RE | Admit: 2012-08-27 | Discharge: 2012-08-27 | Disposition: A | Discharge status: Home / Self Care | Payer: Medicare Other | Source: Ambulatory Visit | Attending: Family Medicine | Admitting: Family Medicine

## 2012-08-27 DIAGNOSIS — R922 Inconclusive mammogram: Secondary | ICD-10-CM | POA: Diagnosis not present

## 2012-08-27 DIAGNOSIS — R928 Other abnormal and inconclusive findings on diagnostic imaging of breast: Secondary | ICD-10-CM

## 2012-09-24 DIAGNOSIS — C44319 Basal cell carcinoma of skin of other parts of face: Secondary | ICD-10-CM | POA: Diagnosis not present

## 2012-09-28 DIAGNOSIS — R159 Full incontinence of feces: Secondary | ICD-10-CM | POA: Diagnosis not present

## 2012-09-28 DIAGNOSIS — K219 Gastro-esophageal reflux disease without esophagitis: Secondary | ICD-10-CM | POA: Diagnosis not present

## 2012-10-01 DIAGNOSIS — M542 Cervicalgia: Secondary | ICD-10-CM | POA: Diagnosis not present

## 2012-10-01 DIAGNOSIS — M5030 Other cervical disc degeneration, unspecified cervical region: Secondary | ICD-10-CM | POA: Diagnosis not present

## 2012-10-01 DIAGNOSIS — M999 Biomechanical lesion, unspecified: Secondary | ICD-10-CM | POA: Diagnosis not present

## 2012-10-01 DIAGNOSIS — M9981 Other biomechanical lesions of cervical region: Secondary | ICD-10-CM | POA: Diagnosis not present

## 2012-10-02 DIAGNOSIS — M5030 Other cervical disc degeneration, unspecified cervical region: Secondary | ICD-10-CM | POA: Diagnosis not present

## 2012-10-02 DIAGNOSIS — M999 Biomechanical lesion, unspecified: Secondary | ICD-10-CM | POA: Diagnosis not present

## 2012-10-02 DIAGNOSIS — M9981 Other biomechanical lesions of cervical region: Secondary | ICD-10-CM | POA: Diagnosis not present

## 2012-10-02 DIAGNOSIS — M542 Cervicalgia: Secondary | ICD-10-CM | POA: Diagnosis not present

## 2012-10-03 DIAGNOSIS — M999 Biomechanical lesion, unspecified: Secondary | ICD-10-CM | POA: Diagnosis not present

## 2012-10-03 DIAGNOSIS — M5030 Other cervical disc degeneration, unspecified cervical region: Secondary | ICD-10-CM | POA: Diagnosis not present

## 2012-10-03 DIAGNOSIS — M542 Cervicalgia: Secondary | ICD-10-CM | POA: Diagnosis not present

## 2012-10-03 DIAGNOSIS — M9981 Other biomechanical lesions of cervical region: Secondary | ICD-10-CM | POA: Diagnosis not present

## 2012-10-05 DIAGNOSIS — M999 Biomechanical lesion, unspecified: Secondary | ICD-10-CM | POA: Diagnosis not present

## 2012-10-05 DIAGNOSIS — M542 Cervicalgia: Secondary | ICD-10-CM | POA: Diagnosis not present

## 2012-10-05 DIAGNOSIS — M9981 Other biomechanical lesions of cervical region: Secondary | ICD-10-CM | POA: Diagnosis not present

## 2012-10-05 DIAGNOSIS — M5030 Other cervical disc degeneration, unspecified cervical region: Secondary | ICD-10-CM | POA: Diagnosis not present

## 2012-10-07 DIAGNOSIS — M542 Cervicalgia: Secondary | ICD-10-CM | POA: Diagnosis not present

## 2012-10-07 DIAGNOSIS — M9981 Other biomechanical lesions of cervical region: Secondary | ICD-10-CM | POA: Diagnosis not present

## 2012-10-07 DIAGNOSIS — M6281 Muscle weakness (generalized): Secondary | ICD-10-CM | POA: Diagnosis not present

## 2012-10-07 DIAGNOSIS — M999 Biomechanical lesion, unspecified: Secondary | ICD-10-CM | POA: Diagnosis not present

## 2012-10-07 DIAGNOSIS — R159 Full incontinence of feces: Secondary | ICD-10-CM | POA: Diagnosis not present

## 2012-10-07 DIAGNOSIS — R279 Unspecified lack of coordination: Secondary | ICD-10-CM | POA: Diagnosis not present

## 2012-10-07 DIAGNOSIS — M5030 Other cervical disc degeneration, unspecified cervical region: Secondary | ICD-10-CM | POA: Diagnosis not present

## 2012-10-09 DIAGNOSIS — M9981 Other biomechanical lesions of cervical region: Secondary | ICD-10-CM | POA: Diagnosis not present

## 2012-10-09 DIAGNOSIS — M5030 Other cervical disc degeneration, unspecified cervical region: Secondary | ICD-10-CM | POA: Diagnosis not present

## 2012-10-09 DIAGNOSIS — M999 Biomechanical lesion, unspecified: Secondary | ICD-10-CM | POA: Diagnosis not present

## 2012-10-09 DIAGNOSIS — M542 Cervicalgia: Secondary | ICD-10-CM | POA: Diagnosis not present

## 2012-10-12 DIAGNOSIS — L570 Actinic keratosis: Secondary | ICD-10-CM | POA: Diagnosis not present

## 2012-10-12 DIAGNOSIS — L821 Other seborrheic keratosis: Secondary | ICD-10-CM | POA: Diagnosis not present

## 2012-10-12 DIAGNOSIS — Z85828 Personal history of other malignant neoplasm of skin: Secondary | ICD-10-CM | POA: Diagnosis not present

## 2012-10-12 DIAGNOSIS — L723 Sebaceous cyst: Secondary | ICD-10-CM | POA: Diagnosis not present

## 2012-10-12 DIAGNOSIS — D485 Neoplasm of uncertain behavior of skin: Secondary | ICD-10-CM | POA: Diagnosis not present

## 2012-10-14 DIAGNOSIS — M542 Cervicalgia: Secondary | ICD-10-CM | POA: Diagnosis not present

## 2012-10-14 DIAGNOSIS — M9981 Other biomechanical lesions of cervical region: Secondary | ICD-10-CM | POA: Diagnosis not present

## 2012-10-14 DIAGNOSIS — M5030 Other cervical disc degeneration, unspecified cervical region: Secondary | ICD-10-CM | POA: Diagnosis not present

## 2012-10-14 DIAGNOSIS — M999 Biomechanical lesion, unspecified: Secondary | ICD-10-CM | POA: Diagnosis not present

## 2012-10-19 DIAGNOSIS — M5030 Other cervical disc degeneration, unspecified cervical region: Secondary | ICD-10-CM | POA: Diagnosis not present

## 2012-10-19 DIAGNOSIS — M9981 Other biomechanical lesions of cervical region: Secondary | ICD-10-CM | POA: Diagnosis not present

## 2012-10-19 DIAGNOSIS — M999 Biomechanical lesion, unspecified: Secondary | ICD-10-CM | POA: Diagnosis not present

## 2012-10-19 DIAGNOSIS — M542 Cervicalgia: Secondary | ICD-10-CM | POA: Diagnosis not present

## 2012-11-18 DIAGNOSIS — R159 Full incontinence of feces: Secondary | ICD-10-CM | POA: Diagnosis not present

## 2012-11-18 DIAGNOSIS — K589 Irritable bowel syndrome without diarrhea: Secondary | ICD-10-CM | POA: Diagnosis not present

## 2012-11-29 ENCOUNTER — Encounter: Payer: Self-pay | Admitting: Cardiology

## 2012-12-01 ENCOUNTER — Encounter (INDEPENDENT_AMBULATORY_CARE_PROVIDER_SITE_OTHER): Payer: Self-pay

## 2012-12-01 ENCOUNTER — Encounter: Payer: Self-pay | Admitting: Cardiology

## 2012-12-01 ENCOUNTER — Ambulatory Visit (INDEPENDENT_AMBULATORY_CARE_PROVIDER_SITE_OTHER): Payer: Medicare Other | Admitting: Cardiology

## 2012-12-01 VITALS — BP 150/62 | HR 79 | Ht 67.0 in | Wt 153.0 lb

## 2012-12-01 DIAGNOSIS — I10 Essential (primary) hypertension: Secondary | ICD-10-CM | POA: Diagnosis not present

## 2012-12-01 DIAGNOSIS — R002 Palpitations: Secondary | ICD-10-CM

## 2012-12-01 DIAGNOSIS — E785 Hyperlipidemia, unspecified: Secondary | ICD-10-CM | POA: Diagnosis not present

## 2012-12-01 HISTORY — DX: Palpitations: R00.2

## 2012-12-01 NOTE — Patient Instructions (Signed)
Your physician recommends that you continue on your current medications as directed. Please refer to the Current Medication list given to you today.  Your physician wants you to follow-up in: 1 year with Dr. Skains. You will receive a reminder letter in the mail two months in advance. If you don't receive a letter, please call our office to schedule the follow-up appointment.  

## 2012-12-01 NOTE — Progress Notes (Signed)
[address] [place], [place]  [address] Phone: [phone] Fax:  [phone]  Date:  12/01/2012   ID:  Bridget Mcdonald, DOB December 26, 1945, MRN [id number]  PCP:  Beverley Fiedler, MD   History of Present Illness: Bridget Mcdonald is a 67 y.o. female with palpitations here for followup. Her cardiac workup includes nuclear stress test negative for ischemia, normal echocardiogram, Holter monitor showing frequent PVCs. She's been taking diltiazem 180 mg once a day. She occasionally will feel palpitations still but these are much improved.  In 2000 when walking to [place], everything stopped. Felt plugged. Had that same feeling recently and now feels better. Blood pressure overall has been good control. Slightly nervous today. Mildly elevated. She also describes Rushing sensation of her left ear that is intermittent. No associated weakness, no seizures, no headaches     Wt Readings from Last 3 Encounters:  12/01/12 153 lb (69.4 kg)  01/27/12 163 lb (73.936 kg)  10/29/11 169 lb 12.8 oz (77.021 kg)     Past Medical History  Diagnosis Date  . History of cardiac arrhythmia   . Hypertension   . High cholesterol   . Cancer     skin, breast (left)  . Chronic headaches   . Allergy history unknown   . Hypothyroidism   . Palpitations 12/01/2012    Echocardiogram - 7/13-normal EF, trace valvular lesions, mildly AI    Past Surgical History  Procedure Laterality Date  . Breast lumpectomy      left  . Tubal ligation    . Oophorectomy    . Tonsilectomy, adenoidectomy, bilateral myringotomy and tubes    . Appendectomy      Current Outpatient Prescriptions  Medication Sig Dispense Refill  . acyclovir (ZOVIRAX) 800 MG tablet Take 800 mg by mouth.       Marland Kitchen alendronate (FOSAMAX) 70 MG tablet Take 70 mg by mouth once a week. Take with a full glass of water on an empty stomach.      Marland Kitchen atorvastatin (LIPITOR) 10 MG tablet Take 10 mg by mouth daily.      .  Calcium-Magnesium-Vitamin D (CALCIUM MAGNESIUM PO) Take by mouth daily.      Marland Kitchen diltiazem (TIAZAC) 360 MG 24 hr capsule Take 360 mg by mouth daily.      Marland Kitchen levothyroxine (SYNTHROID, LEVOTHROID) 200 MCG tablet Take 200 mcg by mouth daily.       No current facility-administered medications for this visit.    Allergies:    Allergies  Allergen Reactions  . Demerol [Meperidine]     hallucinations  . Epinephrine     Super hyper  . Quinine Derivatives     deaf    Social History:  The patient  reports that she has been smoking Cigarettes.  She has a 75 pack-year smoking history. She has never used smokeless tobacco. She reports that she drinks alcohol. She reports that she does not use illicit drugs.   ROS:  Please see the history of present illness.   No CP, no SOB. Rushing in left ear at times.     PHYSICAL EXAM: VS:  BP 150/62  Pulse 79  Ht 5\' 7"  (1.702 m)  Wt 153 lb (69.4 kg)  BMI 23.96 kg/m2 Well nourished, well developed, in no acute distress HEENT: normal Neck: no JVD Cardiac:  normal S1, S2; RRR; no murmur Lungs:  clear to auscultation bilaterally, no wheezing, rhonchi or rales Abd: soft, nontender, no hepatomegaly Ext: no  edema Skin: warm and dry Neuro: no focal abnormalities noted  EKG:  NSR 67    ASSESSMENT AND PLAN:  1. Palpitations - much improved on diltiazem. We will continue. She may have had the sensation of PVC previously. Prior nuclear stress test in 2013 reassuring. Echocardiogram reassuring in 2013. 2. Hypertension-usually her blood pressure is well controlled. No changes made to her regimen today. She was slightly anxious. 3. Rushing sound in left ear-no bruit heard over left carotid. Doing well. Usually benign. No other associated symptoms. Can continue to address with Dr. Barbaraann Barthel. 4. Hyperlipidemia-we discussed fish oil. I'm fine with her taking this. This may help her skin condition is well. She does understand however that this supplement likely will not  affect mortality/MI reduction.  Signed, Donato Schultz, MD [facility]  12/01/2012 10:32 AM

## 2013-02-17 DIAGNOSIS — R3129 Other microscopic hematuria: Secondary | ICD-10-CM | POA: Diagnosis not present

## 2013-02-17 DIAGNOSIS — N281 Cyst of kidney, acquired: Secondary | ICD-10-CM | POA: Diagnosis not present

## 2013-03-08 DIAGNOSIS — K589 Irritable bowel syndrome without diarrhea: Secondary | ICD-10-CM | POA: Diagnosis not present

## 2013-03-08 DIAGNOSIS — R159 Full incontinence of feces: Secondary | ICD-10-CM | POA: Diagnosis not present

## 2013-03-19 ENCOUNTER — Other Ambulatory Visit: Payer: Self-pay

## 2013-03-19 MED ORDER — DILTIAZEM HCL ER BEADS 360 MG PO CP24: 360.0000 mg | ORAL_CAPSULE | Freq: Every day | ORAL | Status: DC

## 2013-03-29 ENCOUNTER — Other Ambulatory Visit: Payer: Self-pay

## 2013-03-29 MED ORDER — DILTIAZEM HCL ER COATED BEADS 240 MG PO CP24: 240.0000 mg | ORAL_CAPSULE | Freq: Every day | ORAL | Status: DC

## 2013-06-18 ENCOUNTER — Other Ambulatory Visit: Payer: Self-pay

## 2013-06-18 DIAGNOSIS — Z1231 Encounter for screening mammogram for malignant neoplasm of breast: Secondary | ICD-10-CM

## 2013-07-08 ENCOUNTER — Encounter: Payer: Self-pay | Admitting: *Deleted

## 2013-07-21 DIAGNOSIS — L723 Sebaceous cyst: Secondary | ICD-10-CM | POA: Diagnosis not present

## 2013-07-21 DIAGNOSIS — Z85828 Personal history of other malignant neoplasm of skin: Secondary | ICD-10-CM | POA: Diagnosis not present

## 2013-07-21 DIAGNOSIS — L570 Actinic keratosis: Secondary | ICD-10-CM | POA: Diagnosis not present

## 2013-08-13 ENCOUNTER — Ambulatory Visit
Admission: RE | Admit: 2013-08-13 | Discharge: 2013-08-13 | Disposition: A | Discharge status: Home / Self Care | Payer: Medicare Other | Source: Ambulatory Visit

## 2013-08-13 ENCOUNTER — Encounter (INDEPENDENT_AMBULATORY_CARE_PROVIDER_SITE_OTHER): Payer: Self-pay

## 2013-08-13 DIAGNOSIS — Z1231 Encounter for screening mammogram for malignant neoplasm of breast: Secondary | ICD-10-CM

## 2013-08-30 DIAGNOSIS — E785 Hyperlipidemia, unspecified: Secondary | ICD-10-CM | POA: Diagnosis not present

## 2013-08-30 DIAGNOSIS — K589 Irritable bowel syndrome without diarrhea: Secondary | ICD-10-CM | POA: Diagnosis not present

## 2013-08-30 DIAGNOSIS — Z23 Encounter for immunization: Secondary | ICD-10-CM | POA: Diagnosis not present

## 2013-08-30 DIAGNOSIS — E559 Vitamin D deficiency, unspecified: Secondary | ICD-10-CM | POA: Diagnosis not present

## 2013-08-30 DIAGNOSIS — Z0000 Encounter for general adult medical examination without abnormal findings: Secondary | ICD-10-CM | POA: Diagnosis not present

## 2013-08-30 DIAGNOSIS — IMO0001 Reserved for inherently not codable concepts without codable children: Secondary | ICD-10-CM | POA: Diagnosis not present

## 2013-08-30 DIAGNOSIS — M810 Age-related osteoporosis without current pathological fracture: Secondary | ICD-10-CM | POA: Diagnosis not present

## 2013-08-30 DIAGNOSIS — F17200 Nicotine dependence, unspecified, uncomplicated: Secondary | ICD-10-CM | POA: Diagnosis not present

## 2013-08-30 DIAGNOSIS — R209 Unspecified disturbances of skin sensation: Secondary | ICD-10-CM | POA: Diagnosis not present

## 2013-08-30 DIAGNOSIS — E039 Hypothyroidism, unspecified: Secondary | ICD-10-CM | POA: Diagnosis not present

## 2013-08-30 DIAGNOSIS — K2270 Barrett's esophagus without dysplasia: Secondary | ICD-10-CM | POA: Diagnosis not present

## 2013-12-01 ENCOUNTER — Ambulatory Visit (INDEPENDENT_AMBULATORY_CARE_PROVIDER_SITE_OTHER): Payer: Medicare Other | Admitting: Cardiology

## 2013-12-01 ENCOUNTER — Encounter: Payer: Self-pay | Admitting: Cardiology

## 2013-12-01 VITALS — BP 132/82 | HR 86 | Ht 67.0 in | Wt 143.0 lb

## 2013-12-01 DIAGNOSIS — R002 Palpitations: Secondary | ICD-10-CM

## 2013-12-01 DIAGNOSIS — H9319 Tinnitus, unspecified ear: Secondary | ICD-10-CM

## 2013-12-01 DIAGNOSIS — I10 Essential (primary) hypertension: Secondary | ICD-10-CM | POA: Insufficient documentation

## 2013-12-01 DIAGNOSIS — E0590 Thyrotoxicosis, unspecified without thyrotoxic crisis or storm: Secondary | ICD-10-CM | POA: Diagnosis not present

## 2013-12-01 DIAGNOSIS — R7309 Other abnormal glucose: Secondary | ICD-10-CM | POA: Diagnosis not present

## 2013-12-01 NOTE — Progress Notes (Signed)
[facility]. [address], [address] [place], [place]  [address] Phone: [phone] Fax:  [phone]  Date:  12/01/2013   ID:  Bridget Mcdonald, DOB 25-Jun-1945, MRN [id number]  PCP:  Milagros Evener, MD   History of Present Illness: Bridget Mcdonald is a 68 y.o. female with palpitations here for followup. Her cardiac workup includes nuclear stress test negative for ischemia, normal echocardiogram, Holter monitor showing frequent PVCs. She's been taking diltiazem 180 mg once a day. She occasionally will feel palpitations still but these are much improved. She did have 2 episodes. Bizarre. Went to reach down to pick something up and it started. Clicks and heavy in chest. Weird. When turning to right. Boom boom.   Blood pressure overall has been good control. Slightly nervous today. Rushing sensation of her left ear that is intermittent. No associated weakness, no seizures, no headaches.  She states that her legs get quite tired when walking up stairs. Her thighs will give out. She does have normal palpable distal pulses. Encouraged walking.    Wt Readings from Last 3 Encounters:  12/01/13 143 lb (64.864 kg)  12/01/12 153 lb (69.4 kg)  01/27/12 163 lb (73.936 kg)     Past Medical History  Diagnosis Date  . History of cardiac arrhythmia   . Hypertension   . High cholesterol   . Chronic headaches   . Allergy history unknown   . Hypothyroidism   . Palpitations 12/01/2012    Echocardiogram - 7/13-normal EF, trace valvular lesions, mildly AI  . Cancer     skin, breast (left)  . Barrett's esophagus     dx'd in [place]  . Acid reflux   . Asthma, mild intermittent   . Migraine headache   . PVC's (premature ventricular contractions)   . 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 Hyperlipidemia   . IBS (irritable bowel syndrome)   . Vestibular neuronitis     herpetic  . Osteopenia   . Cyst of left kidney     mult-locular Dr.Dahlstedt  . Hematuria     Dr. Zannie Cove  .  History of colon polyps     04/2009 [place] unknown histology  . Incontinence, feces     09/2012  . Hypotonia     [name] 09/2012    Past Surgical History  Procedure Laterality Date  . Breast lumpectomy      left  . Tubal ligation    . Oophorectomy    . Tonsilectomy, adenoidectomy, bilateral myringotomy and tubes    . Appendectomy    . Skin surgery  08/2012  . Colonoscopy  04/2009    Current Outpatient Prescriptions  Medication Sig Dispense Refill  . acyclovir (ZOVIRAX) 800 MG tablet Take 800 mg by mouth 5 (five) times daily.     Marland Kitchen alendronate (FOSAMAX) 70 MG tablet Take 70 mg by mouth once a week. Take with a full glass of water on an empty stomach.    Marland Kitchen atorvastatin (LIPITOR) 10 MG tablet Take 10 mg by mouth daily.    . Calcium-Magnesium-Vitamin D (CALCIUM MAGNESIUM PO) Take 200 mg by mouth daily.     . Coenzyme Q10 (CO Q 10 PO) Take by mouth.    . diltiazem (CARDIZEM CD) 240 MG 24 hr capsule Take 1 capsule (240 mg total) by mouth daily. 90 capsule 3  . hyoscyamine (NULEV) 0.125 MG TBDP disintergrating tablet Place 0.125 mg under the tongue every 4 (four) hours as needed.    Marland Kitchen  levothyroxine (SYNTHROID, LEVOTHROID) 175 MCG tablet Take 175 mcg by mouth daily.  0  . Multiple Vitamin (MULTIVITAMIN) tablet Take 1 tablet by mouth daily.    . vitamin E 1000 UNIT capsule Take 1,000 Units by mouth daily.     No current facility-administered medications for this visit.    Allergies:    Allergies  Allergen Reactions  . Demerol [Meperidine]     hallucinations  . Epinephrine     Super hyper  . Quinine Derivatives     deaf    Social History:  The patient  reports that she has been smoking Cigarettes.  She has a 75 pack-year smoking history. She has never used smokeless tobacco. She reports that she drinks alcohol. She reports that she does not use illicit drugs.   ROS:  Please see the history of present illness.   No CP, no SOB. Rushing in left ear at times.     PHYSICAL  EXAM: VS:  BP 132/82 mmHg  Pulse 86  Ht 5\' 7"  (1.702 m)  Wt 143 lb (64.864 kg)  BMI 22.39 kg/m2 Well nourished, well developed, in no acute distress HEENT: normal Neck: no JVD Cardiac:  normal S1, S2; RRR; no murmur Lungs:  clear to auscultation bilaterally, no wheezing, rhonchi or rales Abd: soft, nontender, no hepatomegaly Ext: no edema2+ PT pulses. Varicose veins. Skin: warm and dry Neuro: no focal abnormalities noted  EKG:  12/01/13-sinus rhythm 86 bpm with no other abnormalities prior NSR 67    ASSESSMENT AND PLAN:  1. Palpitations - much improved on diltiazem. We will continue. She may have had the sensation of PVC previously. Prior nuclear stress test in 2013 reassuring. Echocardiogram reassuring in 2013. 2. Hypertension-usually her blood pressure is well controlled. No changes made to her regimen today. She was slightly anxious. 3. Rushing sound in left ear-no bruit heard over left carotid. Doing well. Usually benign. No other associated symptoms.  4. 1 year follow up  Signed, Candee Furbish, MD [facility]  12/01/2013 3:34 PM

## 2013-12-01 NOTE — Patient Instructions (Signed)
The current medical regimen is effective;  continue present plan and medications.  Follow up in 1 year with Dr Skains.  You will receive a letter in the mail 2 months before you are due.  Please call us when you receive this letter to schedule your follow up appointment.  

## 2014-03-08 ENCOUNTER — Other Ambulatory Visit: Payer: Self-pay | Admitting: Family Medicine

## 2014-03-08 DIAGNOSIS — R519 Headache, unspecified: Secondary | ICD-10-CM

## 2014-03-08 DIAGNOSIS — R51 Headache: Principal | ICD-10-CM

## 2014-03-11 ENCOUNTER — Other Ambulatory Visit: Payer: Self-pay | Admitting: Family Medicine

## 2014-03-11 DIAGNOSIS — R51 Headache: Principal | ICD-10-CM

## 2014-03-11 DIAGNOSIS — R519 Headache, unspecified: Secondary | ICD-10-CM

## 2014-03-11 DIAGNOSIS — Z8679 Personal history of other diseases of the circulatory system: Secondary | ICD-10-CM

## 2014-03-14 ENCOUNTER — Ambulatory Visit
Admission: RE | Admit: 2014-03-14 | Discharge: 2014-03-14 | Disposition: A | Discharge status: Home / Self Care | Payer: Medicare Other | Source: Ambulatory Visit | Attending: Family Medicine | Admitting: Family Medicine

## 2014-03-14 DIAGNOSIS — R51 Headache: Secondary | ICD-10-CM | POA: Diagnosis not present

## 2014-03-14 DIAGNOSIS — R519 Headache, unspecified: Secondary | ICD-10-CM

## 2014-03-21 ENCOUNTER — Other Ambulatory Visit: Payer: Self-pay | Admitting: Cardiology

## 2014-04-20 DIAGNOSIS — N281 Cyst of kidney, acquired: Secondary | ICD-10-CM | POA: Diagnosis not present

## 2014-05-04 DIAGNOSIS — R51 Headache: Secondary | ICD-10-CM | POA: Diagnosis not present

## 2014-05-04 DIAGNOSIS — E039 Hypothyroidism, unspecified: Secondary | ICD-10-CM | POA: Diagnosis not present

## 2014-05-23 ENCOUNTER — Encounter: Payer: Self-pay | Admitting: Neurology

## 2014-05-23 ENCOUNTER — Telehealth: Payer: Self-pay | Admitting: *Deleted

## 2014-05-23 ENCOUNTER — Ambulatory Visit (INDEPENDENT_AMBULATORY_CARE_PROVIDER_SITE_OTHER): Payer: Medicare Other | Admitting: Neurology

## 2014-05-23 VITALS — BP 141/74 | HR 69 | Temp 98.0°F | Ht 67.0 in | Wt 143.0 lb

## 2014-05-23 DIAGNOSIS — M5412 Radiculopathy, cervical region: Secondary | ICD-10-CM

## 2014-05-23 DIAGNOSIS — M5481 Occipital neuralgia: Secondary | ICD-10-CM | POA: Diagnosis not present

## 2014-05-23 DIAGNOSIS — G609 Hereditary and idiopathic neuropathy, unspecified: Secondary | ICD-10-CM

## 2014-05-23 DIAGNOSIS — R29898 Other symptoms and signs involving the musculoskeletal system: Secondary | ICD-10-CM | POA: Insufficient documentation

## 2014-05-23 DIAGNOSIS — R05 Cough: Secondary | ICD-10-CM

## 2014-05-23 DIAGNOSIS — R059 Cough, unspecified: Secondary | ICD-10-CM

## 2014-05-23 DIAGNOSIS — M6289 Other specified disorders of muscle: Secondary | ICD-10-CM | POA: Diagnosis not present

## 2014-05-23 MED ORDER — LIDOCAINE 5 % EX OINT: 1.0000 "application " | TOPICAL_OINTMENT | CUTANEOUS | Status: DC | PRN

## 2014-05-23 NOTE — Progress Notes (Addendum)
[facility]    Provider:  Dr Jaynee Eagles Referring Provider: Radene Ou Bill Salinas, MD Primary Care Physician:  Milagros Evener, MD  CC:  headache  HPI:  Bridget Mcdonald is a 69 y.o. female here as a referral from Dr. Radene Ou for headaches. Past medical history of hypertension, high cholesterol, anxiety, depression. Started December 12th. She has a history of trigeminal neuralgia. She has tenderness in the left sided scalp. She has pressure in the head. She has lightning, severe, sharp in the left parietal and occipital areas on the left. Then turns into a pressure headache all over the scalp with soething sitting on on the left frontal/paritel lobe. Headaches are every other day, they last for 5-10 minutes or all day long. Also behind the left ear. She has dizziness. She feels a fluid wave in the brain. She has photopsia. She has bilat tinnitus but no hearing loss and has had hearing checked. She gets occipital headaches. Burning in the occipital area.  Also has numbness in the toes, worsening distally.   Reviewed notes, labs and imaging from outside physicians, which showed: Recently reviewed MRI of the brain and MRA of the head with patient including images. Some mild chronic microvascular ischemic changes nonspecific. Otherwise negative. MRA of the head without significant stenosis in the intracerebral arteries.  Review of Systems: Patient complains of symptoms per HPI as well as the following symptoms: Fatigue, palpitations, murmur, swelling in legs, shortness of breath, cough, wheezing, feeling hot, feeling cold, flushing, memory loss, headache, numbness, weakness, dizziness, tremor, insomnia, sleepiness, restless legs, ringing in ears, spinning sensation, diarrhea, joint pain, cramps, aching muscles, depression, anxiety, not enough sleep, decreased energy, didn't interest in activities, racing thoughts. Pertinent negatives per HPI. All others negative.   History   Social  History  . Marital Status: Single    Spouse Name: N/A  . Number of Children: 1  . Years of Education: Post Grad   Occupational History  . Retired       Secretary/administrator   Social History Main Topics  . Smoking status: Current Every Day Smoker -- 1.50 packs/day for 50 years    Types: Cigarettes  . Smokeless tobacco: Never Used  . Alcohol Use: Yes     Comment: rare  . Drug Use: No  . Sexual Activity: Not on file   Other Topics Concern  . Not on file   Social History Narrative   Lives at home with herself.   Caffeine use: 2 cups per day    Family History  Problem Relation Age of Onset  . Emphysema Mother   . Cancer Brother     prostate  . Cancer Father     prostate  . Heart disease Paternal Grandfather   . Bladder Cancer      Past Medical History  Diagnosis Date  . History of cardiac arrhythmia   . Hypertension   . High cholesterol   . Chronic headaches   . Allergy history unknown   . Hypothyroidism   . Palpitations 12/01/2012    Echocardiogram - 7/13-normal EF, trace valvular lesions, mildly AI  . Cancer     skin, breast (left)  . Barrett's esophagus     dx'd in [place]  . Acid reflux   . Asthma, mild intermittent   . Migraine headache   . PVC's (premature ventricular contractions)   . 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 Hyperlipidemia   . IBS (irritable bowel syndrome)   . Vestibular  neuronitis     herpetic  . Osteopenia   . Cyst of left kidney     mult-locular Dr.Dahlstedt  . Hematuria     Dr. Zannie Cove  . History of colon polyps     04/2009 [place] unknown histology  . Incontinence, feces     09/2012  . Hypotonia     [name] 09/2012    Past Surgical History  Procedure Laterality Date  . Breast lumpectomy  2005?     left  . Tubal ligation    . Oophorectomy  1968?  Marland Kitchen Tonsilectomy, adenoidectomy, bilateral myringotomy and tubes  1961  . Appendectomy  1964  . Skin surgery  08/2012  . Colonoscopy  04/2009    Current Outpatient  Prescriptions  Medication Sig Dispense Refill  . alendronate (FOSAMAX) 70 MG tablet Take 70 mg by mouth once a week. Take with a full glass of water on an empty stomach.    Marland Kitchen atorvastatin (LIPITOR) 10 MG tablet Take 10 mg by mouth daily.    . Coenzyme Q10 (CO Q 10 PO) Take by mouth.    . diltiazem (CARDIZEM CD) 240 MG 24 hr capsule TAKE ONE CAPSULE BY MOUTH EVERY DAY 90 capsule 2  . hyoscyamine (NULEV) 0.125 MG TBDP disintergrating tablet Place 0.125 mg under the tongue every 4 (four) hours as needed.    Marland Kitchen levothyroxine (SYNTHROID, LEVOTHROID) 175 MCG tablet Take 175 mcg by mouth daily.  0  . loperamide (IMODIUM) 1 MG/5ML solution Take 2 mg by mouth as needed for diarrhea or loose stools.    . magnesium oxide (MAG-OX) 400 MG tablet Take 400 mg by mouth daily.    . Multiple Vitamin (MULTIVITAMIN) tablet Take 1 tablet by mouth daily. Nature's way    . Omega-3 Fatty Acids (OMEGA-3 EPA FISH OIL PO) Take 1,400 mg by mouth daily.    . vitamin C (ASCORBIC ACID) 500 MG tablet Take 500 mg by mouth daily.    Marland Kitchen lidocaine (XYLOCAINE) 5 % ointment Apply 1 application topically as needed. 35.44 g 6  . vitamin E 1000 UNIT capsule Take 1,000 Units by mouth daily.     No current facility-administered medications for this visit.    Allergies as of 05/23/2014 - Review Complete 05/23/2014  Allergen Reaction Noted  . Demerol [meperidine]  09/18/2011  . Epinephrine  09/18/2011  . Quinine derivatives  09/18/2011    Vitals: BP 141/74 mmHg  Pulse 69  Temp(Src) 98 F (36.7 C)  Ht 5\' 7"  (1.702 m)  Wt 143 lb (64.864 kg)  BMI 22.39 kg/m2 Last Weight:  Wt Readings from Last 1 Encounters:  05/23/14 143 lb (64.864 kg)   Last Height:   Ht Readings from Last 1 Encounters:  05/23/14 5\' 7"  (1.702 m)   Physical exam: Exam: Gen: NAD, conversant, well nourised, obese, well groomed                     CV: RRR, no MRG. No Carotid Bruits. No peripheral edema, warm, nontender Eyes: Conjunctivae clear without  exudates or hemorrhage  Neuro: Detailed Neurologic Exam  Speech:    Speech is normal; fluent and spontaneous with normal comprehension.  Cognition:    The patient is oriented to person, place, and time;     recent and remote memory intact;     language fluent;     normal attention, concentration,     fund of knowledge Cranial Nerves:    The pupils are equal, round, and  reactive to light. The fundi are normal and spontaneous venous pulsations are present. Visual fields are full to finger confrontation. Extraocular movements are intact. Trigeminal sensation is intact and the muscles of mastication are normal. The face is symmetric. The palate elevates in the midline. Hearing intact. Voice is normal. Shoulder shrug is normal. The tongue has normal motion without fasciculations.   Coordination:    Normal finger to nose and heel to shin. Normal rapid alternating movements.   Gait:    Heel-toe and tandem gait are normal.   Motor Observation:    No asymmetry, no atrophy, and no involuntary movements noted. Tone:    Normal muscle tone.    Posture:    Posture is normal. normal erect    Strength:    Strength is V/V in the upper and lower limbs.      Sensation: intact to LT, pin prick, proprioception and vibration     Reflex Exam:  DTR's:    Deep tendon reflexes in the upper and lower extremities are normal bilaterally.   Toes:    The toes are downgoing bilaterally.   Clonus:    Clonus is absent.       Assessment/Plan:  69 year old female with left-sided headaches since December.  Neuro exam in non focal. Pain is left side and is located in the distribution of the greater, lesser and/or third occipital nerves, paroxysmal and brief, painful, sharp superimposed on a dull headache. Patient is not tender which is unusual. Differential includes pain in the upper cervical joints or disks, suboccipital or upper posterior neck muscles including the traps/scm, spinal and posterior cranial  fossa dura mater, vertebral arteries, structural and infiltrative lesions such as meningioma, schwannoma, myelitis, compressive disk disease and others. MRI and MRA of the brain and head were unremarkable. Will need MRI cervical spine. Provided information to patient on occipital neuralgia and suggested occipital nerve blocks. Also discussed the possibility of treating with medications. Patient will consider get back to me. As far as patient's reported neuropathy goes, sensory exam was intact. We'll request all the labs from [facility] and review.  Addendum: Labs collected November 2015 include hemoglobin A1c of 5.6, normal TSH,   Labs collected August 2015 include normal CBC with differential, normal CMP, sedimentation rate 6, B12 677, LDL [place], MD  [facility] [address] [place], [place] [address]  Phone [phone] Fax [phone]

## 2014-05-23 NOTE — Telephone Encounter (Signed)
Dr Jaynee Eagles requested labs from [facility] Dr Dahlia Bailiff,  faxed release 05-23-14.

## 2014-05-23 NOTE — Patient Instructions (Signed)
Overall you are doing fairly well but I do want to suggest a few things today:   Remember to drink plenty of fluid, eat healthy meals and do not skip any meals. Try to eat protein with a every meal and eat a healthy snack such as fruit or nuts in between meals. Try to keep a regular sleep-wake schedule and try to exercise daily, particularly in the form of walking, 20-30 minutes a day, if you can.   As far as diagnostic testing: MRI of the cervical spine  I would like to see you back in 3 months, sooner if we need to. Please call us with any interim questions, concerns, problems, updates or refill requests.   Please also call us for any test results so we can go over those with you on the phone.  My clinical assistant and will answer any of your questions and relay your messages to me and also relay most of my messages to you.   Our phone number is [phone]. We also have an after hours call service for urgent matters and there is a physician on-call for urgent questions. For any emergencies you know to call 911 or go to the nearest emergency room

## 2014-05-24 NOTE — Telephone Encounter (Signed)
Received lab results from [facility] [address] [place],Dr Jaynee Eagles requested 05/24/14.

## 2014-06-02 ENCOUNTER — Ambulatory Visit
Admission: RE | Admit: 2014-06-02 | Discharge: 2014-06-02 | Disposition: A | Discharge status: Home / Self Care | Payer: Medicare Other | Source: Ambulatory Visit | Attending: Neurology | Admitting: Neurology

## 2014-06-02 DIAGNOSIS — M5481 Occipital neuralgia: Secondary | ICD-10-CM

## 2014-06-02 DIAGNOSIS — M5412 Radiculopathy, cervical region: Secondary | ICD-10-CM | POA: Diagnosis not present

## 2014-06-02 DIAGNOSIS — R29898 Other symptoms and signs involving the musculoskeletal system: Secondary | ICD-10-CM

## 2014-06-02 DIAGNOSIS — M6289 Other specified disorders of muscle: Secondary | ICD-10-CM | POA: Diagnosis not present

## 2014-06-06 ENCOUNTER — Telehealth: Payer: Self-pay | Admitting: *Deleted

## 2014-06-06 NOTE — Telephone Encounter (Signed)
Left message for pt to call back regarding lab results. Gave [facility] phone number and office hours.

## 2014-06-07 ENCOUNTER — Telehealth: Payer: Self-pay | Admitting: Neurology

## 2014-06-07 NOTE — Telephone Encounter (Signed)
Patient called back. Please call and advise.

## 2014-06-07 NOTE — Telephone Encounter (Signed)
Spoke with pt about MRI cervical results to let her know that "although she has some arthritis in her neck, the good news is that there is no indication that she has any nerve pinching or any etiology for her occipital headaches". Pt verbalized understanding.  Also asked pt whether she wanted to try medication or if she wanted to try an occipital nerve block. Pt stated she did some research on the medications and nerve block and is not willing at this time to try these. She stated she has tried gabapentin in the past and this did not work for her. She stated she has previously had cortisone injections in her foot and this made things worse and is afraid the nerve block will do the same thing. Pt stated she is going to keep appt for 06/20/14 at 4:00pm and speak with Dr. Jaynee Eagles about her concerns then. I told her to call back if she needs anything else and I would let Dr. Jaynee Eagles know. Pt verbalized understanding.

## 2014-06-07 NOTE — Telephone Encounter (Signed)
Pt returned call. Please call and advise °

## 2014-06-07 NOTE — Telephone Encounter (Signed)
Left message for pt to return call about results. Gave [facility] phone number and told pt we are open until 5 pm.

## 2014-06-20 ENCOUNTER — Ambulatory Visit (INDEPENDENT_AMBULATORY_CARE_PROVIDER_SITE_OTHER): Payer: Medicare Other | Admitting: Neurology

## 2014-06-20 VITALS — BP 126/70 | HR 83 | Temp 98.0°F | Wt 141.8 lb

## 2014-06-20 DIAGNOSIS — R51 Headache: Secondary | ICD-10-CM

## 2014-06-20 DIAGNOSIS — M542 Cervicalgia: Secondary | ICD-10-CM | POA: Diagnosis not present

## 2014-06-20 DIAGNOSIS — R519 Headache, unspecified: Secondary | ICD-10-CM

## 2014-06-20 NOTE — Patient Instructions (Signed)
Overall you are doing fairly well but I do want to suggest a few things today:   Remember to drink plenty of fluid, eat healthy meals and do not skip any meals. Try to eat protein with a every meal and eat a healthy snack such as fruit or nuts in between meals. Try to keep a regular sleep-wake schedule and try to exercise daily, particularly in the form of walking, 20-30 minutes a day, if you can.   I would like to see you back as needed, sooner if we need to. Please call us with any interim questions, concerns, problems, updates or refill requests.   Please also call us for any test results so we can go over those with you on the phone.  My clinical assistant and will answer any of your questions and relay your messages to me and also relay most of my messages to you.   Our phone number is [phone]. We also have an after hours call service for urgent matters and there is a physician on-call for urgent questions. For any emergencies you know to call 911 or go to the nearest emergency room

## 2014-06-20 NOTE — Progress Notes (Signed)
[facility]    Provider:  Dr Jaynee Eagles Referring Provider: Radene Ou Bill Salinas, MD Primary Care Physician:  Milagros Evener, MD  CC:  headache  HPI:  Bridget Mcdonald is a 69 y.o. female here as a referral from Dr. Radene Ou for headaches. She has a stabbing headache in the left fronto-parietal area. Digging her nails into the skin helps. She is starting to get it on the right as well. Today she reports that she also has facial pain in the face (points to the trigeminal and supraorbital distribution). She has burning in the occipital area on the left. She has done a lot of research, she does not understand why she has these symptoms. She has had trigeminal neuralgia in the past and these symptoms are different. No rhinorrhea, lacrimation or injection with these symptoms. She does not want to try medication.   MRi of the cervical spine: This is an abnormal MRI of the cervical spine showing multilevel degenerative changes as detailed above. There are various combinations of uncovertebral spurring and disc bulging/protrusion at every cervical level. There is no severe foraminal narrowing and there does not appear to be any nerve root compression.   Addendum: Labs collected November 2015 include hemoglobin A1c of 5.6, normal TSH,   Labs collected August 2015 include normal CBC with differential, normal CMP, sedimentation rate 6, B12 677, LDL 87   Initial visit 05/23/2014 Bridget Mcdonald is a 69 y.o. female here as a referral from Dr. Radene Ou for headaches. Past medical history of hypertension, high cholesterol, anxiety, depression. Started December 12th. She has a history of trigeminal neuralgia. She has tenderness in the left sided scalp. She has pressure in the head. She has lightning, severe, sharp in the left parietal and occipital areas on the left. Then turns into a pressure headache all over the scalp with soething sitting on on the left frontal/paritel lobe. Headaches are  every other day, they last for 5-10 minutes or all day long. Also behind the left ear. She has dizziness. She feels a fluid wave in the brain. She has photopsia. She has bilat tinnitus but no hearing loss and has had hearing checked. She gets occipital headaches. Burning in the occipital area.  Also has numbness in the toes, worsening distally.   Reviewed notes, labs and imaging from outside physicians, which showed: Recently reviewed MRI of the brain and MRA of the head with patient including images. Some mild chronic microvascular ischemic changes nonspecific. Otherwise negative. MRA of the head without significant stenosis in the intracerebral arteries.  Review of Systems: Patient complains of symptoms per HPI as well as the following symptoms: no CP, no SOB. Pertinent negatives per HPI. All others negative.   History   Social History  . Marital Status: Single    Spouse Name: N/A  . Number of Children: 1  . Years of Education: Post Grad   Occupational History  . Retired       Secretary/administrator   Social History Main Topics  . Smoking status: Current Every Day Smoker -- 1.50 packs/day for 50 years    Types: Cigarettes  . Smokeless tobacco: Never Used  . Alcohol Use: Yes     Comment: rare  . Drug Use: No  . Sexual Activity: Not on file   Other Topics Concern  . Not on file   Social History Narrative   Lives at home with herself.   Caffeine use: 2 cups per day    Family History  Problem Relation Age of  Onset  . Emphysema Mother   . Cancer Brother     prostate  . Cancer Father     prostate  . Heart disease Paternal Grandfather   . Bladder Cancer      Past Medical History  Diagnosis Date  . History of cardiac arrhythmia   . Hypertension   . High cholesterol   . Chronic headaches   . Allergy history unknown   . Hypothyroidism   . Palpitations 12/01/2012    Echocardiogram - 7/13-normal EF, trace valvular lesions, mildly AI  . Cancer     skin, breast (left)  . Barrett's  esophagus     [place]'d [place]  . Acid reflux   . Asthma, mild intermittent   . Migraine headache   . PVC's (premature ventricular contractions)   . 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 Hyperlipidemia   . IBS (irritable bowel syndrome)   . Vestibular neuronitis     herpetic  . Osteopenia   . Cyst of left kidney     mult-locular Dr.Dahlstedt  . Hematuria     Dr. Zannie Cove  . History of colon polyps     04/2009 [place] unknown histology  . Incontinence, feces     09/2012  . Hypotonia     [name] 09/2012    Past Surgical History  Procedure Laterality Date  . Breast lumpectomy  2005?     left  . Tubal ligation    . Oophorectomy  1968?  Marland Kitchen Tonsilectomy, adenoidectomy, bilateral myringotomy and tubes  1961  . Appendectomy  1964  . Skin surgery  08/2012  . Colonoscopy  04/2009    Current Outpatient Prescriptions  Medication Sig Dispense Refill  . alendronate (FOSAMAX) 70 MG tablet Take 70 mg by mouth once a week. Take with a full glass of water on an empty stomach.    Marland Kitchen atorvastatin (LIPITOR) 10 MG tablet Take 10 mg by mouth daily.    Marland Kitchen diltiazem (CARDIZEM CD) 240 MG 24 hr capsule TAKE ONE CAPSULE BY MOUTH EVERY DAY 90 capsule 2  . hyoscyamine (NULEV) 0.125 MG TBDP disintergrating tablet Place 0.125 mg under the tongue every 4 (four) hours as needed.    Marland Kitchen levothyroxine (SYNTHROID, LEVOTHROID) 175 MCG tablet Take 175 mcg by mouth daily.  0  . loperamide (IMODIUM) 1 MG/5ML solution Take 2 mg by mouth as needed for diarrhea or loose stools.    . magnesium oxide (MAG-OX) 400 MG tablet Take 400 mg by mouth daily.    . Multiple Vitamin (MULTIVITAMIN) tablet Take 1 tablet by mouth daily. Nature's way    . Omega-3 Fatty Acids (OMEGA-3 EPA FISH OIL PO) Take 1,400 mg by mouth daily.    . vitamin C (ASCORBIC ACID) 500 MG tablet Take 500 mg by mouth daily.    Marland Kitchen lidocaine (XYLOCAINE) 5 % ointment Apply 1 application topically as needed. (Patient not taking: Reported on 06/20/2014)  35.44 g 6   No current facility-administered medications for this visit.    Allergies as of 06/20/2014 - Review Complete 05/23/2014  Allergen Reaction Noted  . Demerol [meperidine]  09/18/2011  . Epinephrine  09/18/2011  . Quinine derivatives  09/18/2011    Vitals: BP 126/70 mmHg  Pulse 83  Temp(Src) 98 F (36.7 C)  Wt 141 lb 12.8 oz (64.32 kg) Last Weight:  Wt Readings from Last 1 Encounters:  06/20/14 141 lb 12.8 oz (64.32 kg)   Last Height:   Ht Readings  from Last 1 Encounters:  05/23/14 5\' 7"  (1.702 m)   Physical exam: Exam: Gen: NAD, conversant, well nourised, obese, well groomed                     CV: RRR, no MRG. No Carotid Bruits. No peripheral edema, warm, nontender Eyes: Conjunctivae clear without exudates or hemorrhage  Neuro: Detailed Neurologic Exam  Speech:    Speech is normal; fluent and spontaneous with normal comprehension.  Cognition:    The patient is oriented to person, place, and time;     recent and remote memory intact;     language fluent;     normal attention, concentration,     fund of knowledge Cranial Nerves:    The pupils are equal, round, and reactive to light. Visual fields are full to finger confrontation. Extraocular movements are intact. Trigeminal sensation is intact and the muscles of mastication are normal. The face is symmetric. The palate elevates in the midline. Hearing intact. Voice is normal. Shoulder shrug is normal. The tongue has normal motion without fasciculations.      Assessment/Plan: 69 year old female with left-sided headaches since December. Neuro exam in non focal.She has a stabbing headache in the left fronto-parietal area. "Digging" her nails into the skin helps. She is starting to get it on the right as well. She also has facial pain in the trigeminal and supraorbital distribution. She has burning in the occipital area on the left in the distribution of the lesser and greater occipital nerves. MRI and MRA of  the brain and head were unremarkable. MRI of the cervical spine showed degenerative changes without any nerve root impingement. Patient has pain in multiple nerve distributions including the trigeminal, supraorbital and occipital. 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 Patient is also appears to have associated anxiety which may be contributory. She does not want to be treated for this condition. She can return on an as-needed basis or she changes her mind. She declines occipital and trigeminal nerve blocks.   Sarina Ill, MD  [facility] [address] [place], [place] [address]  Phone [phone] Fax [phone]  A total of 15 minutes was spent face-to-face with this patient. Over half this time was spent on counseling patient on the headache diagnosis and different diagnostic and therapeutic options available.

## 2014-06-25 ENCOUNTER — Encounter: Payer: Self-pay | Admitting: Neurology

## 2014-06-25 DIAGNOSIS — R519 Headache, unspecified: Secondary | ICD-10-CM | POA: Insufficient documentation

## 2014-06-25 DIAGNOSIS — R51 Headache: Secondary | ICD-10-CM

## 2014-07-06 ENCOUNTER — Ambulatory Visit: Payer: Medicare Other | Attending: Neurology

## 2014-07-06 DIAGNOSIS — G44229 Chronic tension-type headache, not intractable: Secondary | ICD-10-CM | POA: Insufficient documentation

## 2014-07-06 DIAGNOSIS — M436 Torticollis: Secondary | ICD-10-CM | POA: Diagnosis not present

## 2014-07-06 NOTE — Therapy (Signed)
[facility] [address] [place], [place], [address] Phone: [phone]   Fax:  [phone]  Physical Therapy Evaluation  Patient Details  Name: Bridget Mcdonald MRN: [id number] Date of Birth: 10/24/1945 Referring Provider:  Melvenia Beam, MD  Encounter Date: 07/06/2014      PT End of Session - 07/06/14 1610    Visit Number 1   Number of Visits 10  medicare   Date for PT Re-Evaluation 08/31/14   PT Start Time 1331   PT Stop Time 1410   PT Time Calculation (min) 39 min   Activity Tolerance Patient tolerated treatment well   Behavior During Therapy [facility] for tasks assessed/performed      Past Medical History  Diagnosis Date  . History of cardiac arrhythmia   . Hypertension   . High cholesterol   . Chronic headaches   . Allergy history unknown   . Hypothyroidism   . Palpitations 12/01/2012    Echocardiogram - 7/13-normal EF, trace valvular lesions, mildly AI  . Cancer     skin, breast (left)  . Barrett's esophagus     [place]'d [place]  . Acid reflux   . Asthma, mild intermittent   . Migraine headache   . PVC's (premature ventricular contractions)   . 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 Hyperlipidemia   . IBS (irritable bowel syndrome)   . Vestibular neuronitis     herpetic  . Osteopenia   . Cyst of left kidney     mult-locular Dr.Dahlstedt  . Hematuria     Dr. Zannie Cove  . History of colon polyps     04/2009 [place] unknown histology  . Incontinence, feces     09/2012  . Hypotonia     [name] 09/2012    Past Surgical History  Procedure Laterality Date  . Breast lumpectomy  2005?     left  . Tubal ligation    . Oophorectomy  1968?  Marland Kitchen Tonsilectomy, adenoidectomy, bilateral myringotomy and tubes  1961  . Appendectomy  1964  . Skin surgery  08/2012  . Colonoscopy  04/2009    There were no vitals filed for this visit.  Visit Diagnosis:  Chronic tension-type headache, not  intractable - Plan: PT plan of care cert/re-cert  Stiffness of neck - Plan: PT plan of care cert/re-cert      Subjective Assessment - 07/06/14 1540    Subjective Pt presents to PT with complaints of headaches that began 12/2013 without cause.  Pt had began to have pain while sitting at the computer.  Pt has headaches 50% of the time.     Diagnostic tests multilevel degenerative changes and narrowing in the cervical spine- see report in imaging   Patient Stated Goals reduce headaches   Currently in Pain? Yes   Pain Score 4    Pain Location Head   Pain Orientation Right;Left   Pain Descriptors / Indicators Aching   Pain Type Chronic pain   Pain Onset More than a month ago   Pain Frequency Intermittent   Aggravating Factors  sitting at the computer   Pain Relieving Factors medication, massage/trigger point release to head and neck muscles.     Effect of Pain on Daily Activities headaches that are nagging            [facility] PT Assessment - 07/06/14 0001    Assessment   Medical Diagnosis musculoskeletal neck pain   Onset Date/Surgical  Date 12/25/13   Hand Dominance Right   Precautions   Precautions Other (comment)  cancer- no ultrasound   Restrictions   Weight Bearing Restrictions No   Balance Screen   Has the patient fallen in the past 6 months No   Has the patient had a decrease in activity level because of a fear of falling?  No   Is the patient reluctant to leave their home because of a fear of falling?  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     PROM   Overall PROM  Deficits   Overall PROM Comments Cervical PROM limited by 20% with stiffness reported at end range   Strength   Overall Strength Deficits   Overall Strength Comments 4+/5 bilateral UE strength bilaterally   Palpation   Spinal mobility pain with PA glides with reduced mobility in cervical and upper thoracic spine     Palpation comment Tension and active trigger points in bilateral suboccipitals and Lt>Rt cervical paraspinals                           PT Education - 07/06/14 1609    Education provided Yes   Education Details HEP: cervical AROM, decompression position, posture education   Person(s) Educated Patient   Methods Explanation;Demonstration;Handout   Comprehension Verbalized understanding;Returned demonstration          PT Short Term Goals - 07/06/14 1635    PT SHORT TERM GOAL #1   Title be independent in initial HEP   Time 4   Period Weeks   Status New   PT SHORT TERM GOAL #2   Title report a 30% reduction in the frequency and intensity of headaches   Time 4   Period Weeks   Status New   PT SHORT TERM GOAL #3   Title demonstate netural seated posture and report compliance with computer use at home at least 50% of the time   Time 8   Period Weeks   Status New           PT Long Term Goals - 07/06/14 1534    PT LONG TERM GOAL #1   Title be independent in advanced HEP   Time 8   Period Weeks   Status New   PT LONG TERM GOAL #2   Title reduce FOTO to < or = to 33% limitation   Time 8   Period Weeks   Status New   PT LONG TERM GOAL #3   Title demonstrate neutral seated posture and report compliance at least 75% of the time with computer use   Time 8    Period Weeks   Status New   PT LONG TERM GOAL #4   Title reduce frequency and intensity  of headaches by 60%   Time 8   Period Weeks   Status New               Plan - 07-13-14 1620    Clinical Impression Statement Pt presents to PT with 6 month history of tension headaches involving neck and head musculature.  Pt demonstrates poor seated postural alignment and active trigger points in head and neck.  Pt will benefit from PT to address postural strength, alignment and manual//modalities to address muscle tension and pain.    Pt will benefit from skilled therapeutic intervention in order to improve on the following deficits Pain;Postural dysfunction;Other (comment);Impaired flexibility  muscle spasm   Rehab Potential Good   PT Frequency 2x / week   PT Duration 8 weeks   PT Treatment/Interventions ADLs/Self Care Home Management;Cryotherapy;Electrical Stimulation;Traction;Moist Heat;Therapeutic activities;Therapeutic exercise;Neuromuscular re-education;Patient/family education;Passive range of motion;Manual techniques;Dry needling   PT Next Visit Plan Suboccipital release, soft tissue elongation, postural/thoracic strength, neck flexibility   Consulted and Agree with Plan of Care Patient          G-Codes - 2014-07-13 1532    Functional Assessment Tool Used FOTO: 39% limitation   Functional Limitation Carrying, moving and handling objects   Carrying, Moving and Handling Objects Current Status (W9675) At least 20 percent but less than 40 percent impaired, limited or restricted   Carrying, Moving and Handling Objects Goal Status (F1638) At least 20 percent but less than 40 percent impaired, limited or restricted       Problem List Patient Active Problem List   Diagnosis Date Noted  . Craniofacial pain 06/25/2014  . Occipital neuralgia of left side 05/23/2014  . Weakness of left hand 05/23/2014  . Weakness of left arm 05/23/2014  . Radiculopathy of cervical region 05/23/2014   . Essential hypertension 12/01/2013  . Ear noise/buzzing 12/01/2013  . Palpitations 12/01/2012  . COPD GOLD I 01/27/2012  . Smoker 09/19/2011  . Cough 09/18/2011    Kyheem Bathgate, PT 07/13/2014, 4:42 PM  [facility] [address] [place], [place], [address] Phone: [phone]   Fax:  [phone]

## 2014-07-06 NOTE — Patient Instructions (Signed)
PERFORM ALL EXERCISES GENTLY AND WITH GOOD POSTURE.    20 SECOND HOLD, 3 REPS TO EACH SIDE. 4-5 TIMES EACH DAY.   AROM: Neck Rotation   Turn head slowly to look over one shoulder, then the other.   AROM: Neck Flexion   Bend head forward.   AROM: Lateral Neck Flexion   Slowly tilt head toward one shoulder, then the other.  Breathing (Towel Roll, Rice Bag)   Fold bath towel in half, then fold it lengthwise three times. Recline with towel lengthwise under lower back, torso, and head (may prop head with extra roll). Rest 10-lb rice bag on lower abdomen. Welcome the weight to support your exhale. Inhale with gentle but deliberate strength. Remain in position for ____ minutes.   Posture - Standing   Good posture is important. Avoid slouching and forward head thrust. Maintain curve in low back and align ears over shoulders, hips over ankles.  Pull your belly button in toward your back bone. Posture Tips DO: - stand tall and erect - keep chin tucked in - keep head and shoulders in alignment - check posture regularly in mirror or large window - pull head back against headrest in car seat;  Change your position often.  Sit with lumbar support. DON'T: - slouch or slump while watching TV or reading - sit, stand or lie in one position  for too long;  Sitting is especially hard on the spine so if you sit at a desk/use the computer, then stand up often! Copyright  VHI. All rights reserved.  Posture - Sitting  Sit upright, head facing forward. Try using a roll to support lower back. Keep shoulders relaxed, and avoid rounded back. Keep hips level with knees. Avoid crossing legs for long periods. Copyright  VHI. All rights reserved.  Chronic neck strain can develop because of poor posture and faulty work habits  Postural strain related to slumped sitting and forward head posture is a leading cause of headaches, neck and upper back pain  General strengthening and flexibility exercises are  helpful in the treatment of neck pain.  Most importantly, you should learn to correct the posture that may be contributing to chronic pain.   Change positions frequently  Change your work or home environment to improve posture and mechanics.  [place] [address], [place], [place] [address] Phone # [phone] Fax [phone]

## 2014-07-20 ENCOUNTER — Ambulatory Visit: Payer: Medicare Other | Attending: Neurology | Admitting: Physical Therapy

## 2014-07-20 ENCOUNTER — Encounter: Payer: Self-pay | Admitting: Physical Therapy

## 2014-07-20 DIAGNOSIS — M436 Torticollis: Secondary | ICD-10-CM | POA: Insufficient documentation

## 2014-07-20 DIAGNOSIS — G44229 Chronic tension-type headache, not intractable: Secondary | ICD-10-CM | POA: Insufficient documentation

## 2014-07-20 NOTE — Therapy (Signed)
[facility] [address], [place], [place], [address] Phone: [phone]   Fax:  [phone]  Physical Therapy Treatment  Patient Details  Name: Mechel Haggard MRN: [id number] Date of Birth: 01/06/1946 Referring Provider:  Aretta Nip, MD  Encounter Date: 07/20/2014      PT End of Session - 07/20/14 1337    Visit Number 2   Number of Visits 10   Date for PT Re-Evaluation 08/31/14   PT Start Time 9509   PT Stop Time 1246   PT Time Calculation (min) 59 min   Activity Tolerance Patient tolerated treatment well   Behavior During Therapy [facility] for tasks assessed/performed      Past Medical History  Diagnosis Date  . History of cardiac arrhythmia   . Hypertension   . High cholesterol   . Chronic headaches   . Allergy history unknown   . Hypothyroidism   . Palpitations 12/01/2012    Echocardiogram - 7/13-normal EF, trace valvular lesions, mildly AI  . Cancer     skin, breast (left)  . Barrett's esophagus     [place]'d [place]  . Acid reflux   . Asthma, mild intermittent   . Migraine headache   . PVC's (premature ventricular contractions)   . 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 Hyperlipidemia   . IBS (irritable bowel syndrome)   . Vestibular neuronitis     herpetic  . Osteopenia   . Cyst of left kidney     mult-locular Dr.Dahlstedt  . Hematuria     Dr. Zannie Cove  . History of colon polyps     04/2009 [place] unknown histology  . Incontinence, feces     09/2012  . Hypotonia     [name] 09/2012    Past Surgical History  Procedure Laterality Date  . Breast lumpectomy  2005?     left  . Tubal ligation    . Oophorectomy  1968?  Marland Kitchen Tonsilectomy, adenoidectomy, bilateral myringotomy and tubes  1961  . Appendectomy  1964  . Skin surgery  08/2012  . Colonoscopy  04/2009    There were no vitals filed for this visit.  Visit Diagnosis:  Chronic tension-type headache, not  intractable  Stiffness of neck      Subjective Assessment - 07/20/14 1155    Subjective Pt reports compliance with initial HEP and it seems to help, the headache was less intense and shorter duration   Currently in Pain? Yes   Pain Score 2    Pain Location Head   Pain Orientation Right;Left   Pain Descriptors / Indicators Aching   Pain Type Chronic pain   Pain Onset More than a month ago   Pain Frequency Intermittent   Multiple Pain Sites No                         [facility] Adult PT Treatment/Exercise - 07/20/14 0001    Exercises   Exercises Neck;Shoulder   Neck Exercises: Theraband   Scapula Retraction --   Shoulder External Rotation 20 reps;Other (comment)  yellow t-band, with 5 sec hold   Neck Exercises: Seated   Cervical Rotation Both;Other reps (comment)  3 reps, 20sec hold, opposite UE fixated on chair to incr str   Lateral Flexion Both;Other (comment)  3 reps,opposite UE fixated on chair to incr stretch   Other Seated Exercise neck flexion, 3 reps x 20 sec hold  Shoulder Exercises: Supine   Other Supine Exercises OA release over towel roll, chintucks 2x10 over towel roll   Modalities   Modalities Electrical Stimulation;Moist Heat   Moist Heat Therapy   Number Minutes Moist Heat 15 Minutes   Moist Heat Location Cervical   Electrical Stimulation   Electrical Stimulation Location Neck   Electrical Stimulation Action IFC   Electrical Stimulation Parameters 80-150Hz    Electrical Stimulation Goals Tone;Pain   Manual Therapy   Manual Therapy --  to bil cervical paraspinals and ant with PROM for elongation   Manual therapy comments palpaple tighness in SCM, scalenies, UT all bil                 PT Education - 07/20/14 1335    Education provided Yes   Education Details shoulder strenth with bil ER using yellow t-band   Person(s) Educated Patient   Methods Explanation;Demonstration;Handout  handout from file cabinet   Comprehension  Verbalized understanding;Returned demonstration;Tactile cues required          PT Short Term Goals - 07/20/14 1341    PT SHORT TERM GOAL #1   Title be independent in initial HEP   Time 4   Period Weeks   Status On-going   PT SHORT TERM GOAL #2   Title report a 30% reduction in the frequency and intensity of headaches   Time 4   Period Weeks   Status On-going   PT SHORT TERM GOAL #3   Title demonstate netural seated posture and report compliance with computer use at home at least 50% of the time   Time 8   Period Weeks   Status On-going           PT Long Term Goals - 07/20/14 1341    PT LONG TERM GOAL #1   Title be independent in advanced HEP   Time 8   Period Weeks   Status On-going   PT LONG TERM GOAL #2   Title reduce FOTO to < or = to 33% limitation   Time 8   Period Weeks   Status On-going   PT LONG TERM GOAL #3   Title demonstrate neutral seated posture and report compliance at least 75% of the time with computer use   Time 8   Period Weeks   Status On-going   PT LONG TERM GOAL #4   Title reduce frequency and intensity of headaches by 60%   Time 8   Period Weeks   Status On-going               Plan - 07/20/14 1338    Clinical Impression Statement Pt presents to PT with history of tension headaches involving neck and head musculatur (19month). Pt with poor posture and weak postural musculatur. Pt will benefit from PT to address postural strength, alignment and  mucle tension with manual/ and modalities   Pt will benefit from skilled therapeutic intervention in order to improve on the following deficits Pain;Postural dysfunction;Other (comment);Impaired flexibility   Rehab Potential Good   PT Frequency 2x / week   PT Duration 8 weeks   PT Treatment/Interventions ADLs/Self Care Home Management;Cryotherapy;Electrical Stimulation;Traction;Moist Heat;Therapeutic activities;Therapeutic exercise;Neuromuscular re-education;Patient/family education;Passive  range of motion;Manual techniques;Dry needling   PT Next Visit Plan Suboccipital release, soft tissue elongation, postural/thoracic strength, neck flexibility   Consulted and Agree with Plan of Care Patient        Problem List Patient Active Problem List   Diagnosis Date Noted  . Craniofacial  pain 06/25/2014  . Occipital neuralgia of left side 05/23/2014  . Weakness of left hand 05/23/2014  . Weakness of left arm 05/23/2014  . Radiculopathy of cervical region 05/23/2014  . Essential hypertension 12/01/2013  . Ear noise/buzzing 12/01/2013  . Palpitations 12/01/2012  . COPD GOLD I 01/27/2012  . Smoker 09/19/2011  . Cough 09/18/2011    NAUMANN-HOUEGNIFIO,Spirit Wernli PTA 07/20/2014, 1:51 PM  [facility] [address] [place], [place], [address] Phone: [phone]   Fax:  [phone]

## 2014-07-26 ENCOUNTER — Ambulatory Visit: Payer: Medicare Other | Admitting: Physical Therapy

## 2014-07-28 ENCOUNTER — Encounter: Payer: Self-pay | Admitting: Physical Therapy

## 2014-07-28 ENCOUNTER — Ambulatory Visit: Payer: Medicare Other | Admitting: Physical Therapy

## 2014-07-28 DIAGNOSIS — M436 Torticollis: Secondary | ICD-10-CM

## 2014-07-28 DIAGNOSIS — G44229 Chronic tension-type headache, not intractable: Secondary | ICD-10-CM | POA: Diagnosis not present

## 2014-07-28 DIAGNOSIS — L570 Actinic keratosis: Secondary | ICD-10-CM | POA: Diagnosis not present

## 2014-07-28 DIAGNOSIS — D229 Melanocytic nevi, unspecified: Secondary | ICD-10-CM | POA: Diagnosis not present

## 2014-07-28 DIAGNOSIS — L720 Epidermal cyst: Secondary | ICD-10-CM | POA: Diagnosis not present

## 2014-07-28 DIAGNOSIS — L821 Other seborrheic keratosis: Secondary | ICD-10-CM | POA: Diagnosis not present

## 2014-07-28 DIAGNOSIS — D485 Neoplasm of uncertain behavior of skin: Secondary | ICD-10-CM | POA: Diagnosis not present

## 2014-07-28 DIAGNOSIS — C44311 Basal cell carcinoma of skin of nose: Secondary | ICD-10-CM | POA: Diagnosis not present

## 2014-07-28 DIAGNOSIS — Z85828 Personal history of other malignant neoplasm of skin: Secondary | ICD-10-CM | POA: Diagnosis not present

## 2014-07-28 NOTE — Therapy (Signed)
[facility] [address], [place], [place], [address] Phone: [phone]   Fax:  [phone]  Physical Therapy Treatment  Patient Details  Name: Bridget Mcdonald MRN: [id number] Date of Birth: [birth year] Referring Provider:  Aretta Nip, MD  Encounter Date: 07/28/2014      PT End of Session - 07/28/14 1457    Visit Number 3   Number of Visits 10   Date for PT Re-Evaluation 08/31/14   PT Start Time 6546   PT Stop Time 1543   PT Time Calculation (min) 58 min   Activity Tolerance Patient tolerated treatment well   Behavior During Therapy [facility] for tasks assessed/performed      Past Medical History  Diagnosis Date  . History of cardiac arrhythmia   . Hypertension   . High cholesterol   . Chronic headaches   . Allergy history unknown   . Hypothyroidism   . Palpitations 12/01/2012    Echocardiogram - 7/13-normal EF, trace valvular lesions, mildly AI  . Cancer     skin, breast (left)  . Barrett's esophagus     [place]'d [place]  . Acid reflux   . Asthma, mild intermittent   . Migraine headache   . PVC's (premature ventricular contractions)   . 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 Hyperlipidemia   . IBS (irritable bowel syndrome)   . Vestibular neuronitis     herpetic  . Osteopenia   . Cyst of left kidney     mult-locular Dr.Dahlstedt  . Hematuria     Dr. Zannie Cove  . History of colon polyps     04/2009 [place] unknown histology  . Incontinence, feces     09/2012  . Hypotonia     [name] 09/2012    Past Surgical History  Procedure Laterality Date  . Breast lumpectomy  2005?     left  . Tubal ligation    . Oophorectomy  1968?  Marland Kitchen Tonsilectomy, adenoidectomy, bilateral myringotomy and tubes  1961  . Appendectomy  1964  . Skin surgery  08/2012  . Colonoscopy  04/2009    There were no vitals filed for this visit.  Visit Diagnosis:  Chronic tension-type headache, not  intractable  Stiffness of neck      Subjective Assessment - 07/28/14 1453    Subjective Pt felt great after last PT session, but then late in the evening the muscles tightened up and the headache   Currently in Pain? Yes   Pain Score 2    Pain Location Head   Pain Orientation Right;Left   Pain Descriptors / Indicators Aching   Pain Type Chronic pain   Pain Onset More than a month ago   Pain Frequency Intermittent   Multiple Pain Sites No                         [facility] Adult PT Treatment/Exercise - 07/28/14 0001    Exercises   Exercises Neck;Shoulder   Neck Exercises: Supine   Cervical Rotation 5 reps;Both  20 sec hold with legs rotated to opposite side to incr stret   Shoulder ABduction 20 reps  with yellow t-band head supported on small towel   Shoulder Exercises: Supine   Other Supine Exercises OA release over towel roll, chintucks 2x10 over towel roll   Other Supine Exercises Melt method over foam roll flexion and unilat rotation Lt & Rt  2 x  10 each direction   Modalities   Modalities Electrical Stimulation;Moist Heat   Moist Heat Therapy   Number Minutes Moist Heat 10 Minutes  only 102min due pt has appointment at dermatologist   Moist Heat Location Cervical   Electrical Stimulation   Electrical Stimulation Location Neck   Electrical Stimulation Action IFC   Electrical Stimulation Parameters 80-150Hz    Electrical Stimulation Goals Tone;Pain   Manual Therapy   Manual Therapy Soft tissue mobilization   Manual therapy comments to bil cervical paraspinals and UT with PROM for elongation                  PT Short Term Goals - 07/28/14 1510    PT SHORT TERM GOAL #1   Title be independent in initial HEP   Time 4   Period Weeks   Status On-going   PT SHORT TERM GOAL #2   Title report a 30% reduction in the frequency and intensity of headaches   Time 4   Period Weeks   Status On-going   PT SHORT TERM GOAL #3   Title demonstate netural  seated posture and report compliance with computer use at home at least 50% of the time   Time 4   Period Weeks   Status On-going           PT Long Term Goals - 07/20/14 1341    PT LONG TERM GOAL #1   Title be independent in advanced HEP   Time 8   Period Weeks   Status On-going   PT LONG TERM GOAL #2   Title reduce FOTO to < or = to 33% limitation   Time 8   Period Weeks   Status On-going   PT LONG TERM GOAL #3   Title demonstrate neutral seated posture and report compliance at least 75% of the time with computer use   Time 8   Period Weeks   Status On-going   PT LONG TERM GOAL #4   Title reduce frequency and intensity of headaches by 60%   Time 8   Period Weeks   Status On-going               Plan - 07/28/14 1500    Clinical Impression Statement Pt presents with history of tension headaches involving neck and head musculatur (20month). Pt with poor posture and weak postural musculatur. Pt will benefit from skilled PT to address postural strength and alignment   PT Next Visit Plan Cervical ROM in supine, Melt metho, OA release, modalities PRN   Consulted and Agree with Plan of Care Patient        Problem List Patient Active Problem List   Diagnosis Date Noted  . Craniofacial pain 06/25/2014  . Occipital neuralgia of left side 05/23/2014  . Weakness of left hand 05/23/2014  . Weakness of left arm 05/23/2014  . Radiculopathy of cervical region 05/23/2014  . Essential hypertension 12/01/2013  . Ear noise/buzzing 12/01/2013  . Palpitations 12/01/2012  . COPD GOLD I 01/27/2012  . Smoker 09/19/2011  . Cough 09/18/2011    NAUMANN-HOUEGNIFIO,Nita Whitmire PTA 07/28/2014, 3:40 PM  [facility] [address], [place], [place], [address] Phone: [phone]   Fax:  [phone]

## 2014-08-02 ENCOUNTER — Ambulatory Visit: Payer: Medicare Other | Admitting: Physical Therapy

## 2014-08-03 ENCOUNTER — Encounter: Payer: Self-pay | Admitting: Physical Therapy

## 2014-08-03 ENCOUNTER — Ambulatory Visit: Payer: Medicare Other | Admitting: Physical Therapy

## 2014-08-03 DIAGNOSIS — M436 Torticollis: Secondary | ICD-10-CM

## 2014-08-03 DIAGNOSIS — G44229 Chronic tension-type headache, not intractable: Secondary | ICD-10-CM

## 2014-08-03 NOTE — Therapy (Signed)
[facility] [address] [place], [place], [address] Phone: [phone]   Fax:  [phone]  Physical Therapy Treatment  Patient Details  Name: Bridget Mcdonald MRN: [id number] Date of Birth: [birth year] Referring Provider:  Aretta Nip, MD  Encounter Date: 08/03/2014      PT End of Session - 08/03/14 0901    Visit Number 4   Number of Visits 10   Date for PT Re-Evaluation 08/31/14   PT Start Time 0845   PT Stop Time 0947   PT Time Calculation (min) 62 min   Activity Tolerance Patient tolerated treatment well   Behavior During Therapy [facility] for tasks assessed/performed      Past Medical History  Diagnosis Date  . History of cardiac arrhythmia   . Hypertension   . High cholesterol   . Chronic headaches   . Allergy history unknown   . Hypothyroidism   . Palpitations 12/01/2012    Echocardiogram - 7/13-normal EF, trace valvular lesions, mildly AI  . Cancer     skin, breast (left)  . Barrett's esophagus     [place]  . Acid reflux   . Asthma, mild intermittent   . Migraine headache   . PVC's (premature ventricular contractions)   . 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 Hyperlipidemia   . IBS (irritable bowel syndrome)   . Vestibular neuronitis     herpetic  . Osteopenia   . Cyst of left kidney     mult-locular Dr.Dahlstedt  . Hematuria     Dr. Zannie Cove  . History of colon polyps     04/2009 [place] unknown histology  . Incontinence, feces     09/2012  . Hypotonia     [name] 09/2012    Past Surgical History  Procedure Laterality Date  . Breast lumpectomy  2005?     left  . Tubal ligation    . Oophorectomy  1968?  Marland Kitchen Tonsilectomy, adenoidectomy, bilateral myringotomy and tubes  1961  . Appendectomy  1964  . Skin surgery  08/2012  . Colonoscopy  04/2009    There were no vitals filed for this visit.  Visit Diagnosis:  Chronic tension-type headache, not  intractable  Stiffness of neck      Subjective Assessment - 08/03/14 0859    Subjective Since Saturday pt with intense headache, night rest is disturbed and nothing seems to really help with the pain in the forehead and eyes.   Currently in Pain? Yes   Pain Score 5    Pain Location Head   Pain Orientation Right;Left   Pain Descriptors / Indicators Aching   Pain Type Chronic pain   Pain Onset More than a month ago   Pain Frequency Intermittent   Multiple Pain Sites No                         [facility] Adult PT Treatment/Exercise - 08/03/14 0001    Exercises   Exercises Neck;Shoulder   Neck Exercises: Supine   Cervical Rotation 5 reps;Both  OA over small towelroll   Other Supine Exercise Diaphragmatic Relaxing breathing  3 x 5   Other Supine Exercise eye movement up/down, side/side, lying 8 each direction    Shoulder Exercises: Supine   Other Supine Exercises OA release over towel roll, chintucks 2x10 over towel roll, Foam roll for elongation & decompression x73min   Other Supine Exercises Melt  method over foam roll flexion and unilat rotation Lt & Rt  2 x 10 each direction   Modalities   Modalities Electrical Stimulation;Moist Heat   Moist Heat Therapy   Number Minutes Moist Heat 20 Minutes   Moist Heat Location Cervical   Electrical Stimulation   Electrical Stimulation Location Neck   Electrical Stimulation Action IFC   Electrical Stimulation Parameters 80-150Hz    Electrical Stimulation Goals Tone;Pain   Manual Therapy   Manual Therapy Soft tissue mobilization   Manual therapy comments to bil cervical paraspinals and UT with PROM for elongation                PT Education - 08/03/14 0948    Education provided Yes   Education Details verbally instructed in performing eyemovements side/side, up/down, lying 8 each direction,  Pt educated on spinal model about biomechanics and importance of posture    Person(s) Educated Patient   Methods  Explanation;Demonstration   Comprehension Verbalized understanding;Returned demonstration          PT Short Term Goals - 08/03/14 0910    PT SHORT TERM GOAL #1   Title be independent in initial HEP   Period Weeks   Status On-going   PT SHORT TERM GOAL #2   Title report a 30% reduction in the frequency and intensity of headaches   Time 4   Period Weeks   Status On-going   PT SHORT TERM GOAL #3   Title demonstate netural seated posture and report compliance with computer use at home at least 50% of the time   Time 4   Period Weeks   Status On-going           PT Long Term Goals - 07/20/14 1341    PT LONG TERM GOAL #1   Title be independent in advanced HEP   Time 8   Period Weeks   Status On-going   PT LONG TERM GOAL #2   Title reduce FOTO to < or = to 33% limitation   Time 8   Period Weeks   Status On-going   PT LONG TERM GOAL #3   Title demonstrate neutral seated posture and report compliance at least 75% of the time with computer use   Time 8   Period Weeks   Status On-going   PT LONG TERM GOAL #4   Title reduce frequency and intensity of headaches by 60%   Time 8   Period Weeks   Status On-going               Plan - 08/03/14 0904    Clinical Impression Statement Pt presents with headache and slow, guarded movement of cervical and head positions due to headache. Pt with poor posture and weak postural musculatur. Pt will continue to benefit from skilled PT to manage pain and improve posture.   Pt will benefit from skilled therapeutic intervention in order to improve on the following deficits Pain;Postural dysfunction;Other (comment);Impaired flexibility   Rehab Potential Good   PT Frequency 2x / week   PT Duration 8 weeks   PT Treatment/Interventions ADLs/Self Care Home Management;Cryotherapy;Electrical Stimulation;Traction;Moist Heat;Therapeutic activities;Therapeutic exercise;Neuromuscular re-education;Patient/family education;Passive range of  motion;Manual techniques;Dry needling   PT Next Visit Plan continue with gentle flexibility cervical and postural strength    Consulted and Agree with Plan of Care Patient        Problem List Patient Active Problem List   Diagnosis Date Noted  . Craniofacial pain 06/25/2014  . Occipital neuralgia of  left side 05/23/2014  . Weakness of left hand 05/23/2014  . Weakness of left arm 05/23/2014  . Radiculopathy of cervical region 05/23/2014  . Essential hypertension 12/01/2013  . Ear noise/buzzing 12/01/2013  . Palpitations 12/01/2012  . COPD GOLD I 01/27/2012  . Smoker 09/19/2011  . Cough 09/18/2011    NAUMANN-HOUEGNIFIO,Orlinda Slomski PTA 08/03/2014, 9:51 AM  [facility] [address] [place], [place], [address] Phone: [phone]   Fax:  [phone]

## 2014-08-04 ENCOUNTER — Encounter: Payer: Medicare Other | Admitting: Physical Therapy

## 2014-08-05 ENCOUNTER — Other Ambulatory Visit: Payer: Self-pay

## 2014-08-05 DIAGNOSIS — Z9889 Other specified postprocedural states: Secondary | ICD-10-CM

## 2014-08-05 DIAGNOSIS — Z853 Personal history of malignant neoplasm of breast: Secondary | ICD-10-CM

## 2014-08-05 DIAGNOSIS — Z1231 Encounter for screening mammogram for malignant neoplasm of breast: Secondary | ICD-10-CM

## 2014-08-09 ENCOUNTER — Ambulatory Visit: Payer: Medicare Other

## 2014-08-09 DIAGNOSIS — G44229 Chronic tension-type headache, not intractable: Secondary | ICD-10-CM | POA: Diagnosis not present

## 2014-08-09 DIAGNOSIS — M436 Torticollis: Secondary | ICD-10-CM

## 2014-08-09 NOTE — Therapy (Signed)
[facility] [address] [place], [place], [address] Phone: [phone]   Fax:  [phone]  Physical Therapy Treatment  Patient Details  Name: Bridget Mcdonald MRN: [id number] Date of Birth: 11-30-1945 Referring Provider:  Melvenia Beam, MD  Encounter Date: 08/09/2014      PT End of Session - 08/09/14 0843    Visit Number 5   Number of Visits 10   Date for PT Re-Evaluation 08/31/14   PT Start Time 0806   PT Stop Time 0856   PT Time Calculation (min) 50 min   Activity Tolerance Patient tolerated treatment well   Behavior During Therapy [facility] for tasks assessed/performed      Past Medical History  Diagnosis Date  . History of cardiac arrhythmia   . Hypertension   . High cholesterol   . Chronic headaches   . Allergy history unknown   . Hypothyroidism   . Palpitations 12/01/2012    Echocardiogram - 7/13-normal EF, trace valvular lesions, mildly AI  . Cancer     skin, breast (left)  . Barrett's esophagus     [place]'d [place]  . Acid reflux   . Asthma, mild intermittent   . Migraine headache   . PVC's (premature ventricular contractions)   . 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 Hyperlipidemia   . IBS (irritable bowel syndrome)   . Vestibular neuronitis     herpetic  . Osteopenia   . Cyst of left kidney     mult-locular Dr.Dahlstedt  . Hematuria     Dr. Zannie Cove  . History of colon polyps     04/2009 [place] unknown histology  . Incontinence, feces     09/2012  . Hypotonia     [name] 09/2012    Past Surgical History  Procedure Laterality Date  . Breast lumpectomy  2005?     left  . Tubal ligation    . Oophorectomy  1968?  Marland Kitchen Tonsilectomy, adenoidectomy, bilateral myringotomy and tubes  1961  . Appendectomy  1964  . Skin surgery  08/2012  . Colonoscopy  04/2009    There were no vitals filed for this visit.  Visit Diagnosis:  Chronic tension-type headache, not  intractable  Stiffness of neck      Subjective Assessment - 08/09/14 0812    Subjective Didn't sleep last night.  Pt reports that she loves the foam roll.     Patient Stated Goals reduce headaches   Currently in Pain? Yes   Pain Score 3    Pain Location Head   Pain Orientation Right;Left   Pain Descriptors / Indicators Aching   Pain Type Chronic pain   Pain Onset More than a month ago   Pain Frequency Intermittent   Aggravating Factors  sitting at the computer   Pain Relieving Factors medication, trigger point release   Multiple Pain Sites No                         [facility] Adult PT Treatment/Exercise - 08/09/14 0001    Shoulder Exercises: Supine   Horizontal ABduction Strengthening;Both;20 reps   Theraband Level (Shoulder Horizontal ABduction) Level 1 (Yellow)  on foam roll   Other Supine Exercises OA release over towel roll, chintucks 2x10 over towel roll, Foam roll for elongation & decompression x55min   Other Supine Exercises Melt method over foam roll flexion and unilat rotation Lt & Rt  2  x 10 each direction   Moist Heat Therapy   Number Minutes Moist Heat 15 Minutes   Moist Heat Location Cervical   Electrical Stimulation   Electrical Stimulation Location neck   Electrical Stimulation Action IFC   Electrical Stimulation Parameters 15 minutes   Electrical Stimulation Goals Pain   Manual Therapy   Manual Therapy Soft tissue mobilization   Manual therapy comments to bil cervical paraspinals and UT with PROM for elongation                  PT Short Term Goals - 08/09/14 0816    PT SHORT TERM GOAL #1   Title be independent in initial HEP   Status Achieved   PT SHORT TERM GOAL #2   Title report a 30% reduction in the frequency and intensity of headaches   Status On-going  50% reduction in intensity, 20% frequency   PT SHORT TERM GOAL #3   Title demonstate netural seated posture and report compliance with computer use at home at least 50% of  the time   Status Achieved           PT Long Term Goals - 07/20/14 1341    PT LONG TERM GOAL #1   Title be independent in advanced HEP   Time 8   Period Weeks   Status On-going   PT LONG TERM GOAL #2   Title reduce FOTO to < or = to 33% limitation   Time 8   Period Weeks   Status On-going   PT LONG TERM GOAL #3   Title demonstrate neutral seated posture and report compliance at least 75% of the time with computer use   Time 8   Period Weeks   Status On-going   PT LONG TERM GOAL #4   Title reduce frequency and intensity of headaches by 60%   Time 8   Period Weeks   Status On-going               Plan - 08/09/14 2563    Clinical Impression Statement Pt with 50% reduction in intensity of headaches and 20% reduction in frequency.  Pt with continued significant muscle tension in neck and suboccipitals.  Pt will benefit from continued PT for posture, trigger point release and spinal decompression to reduce headaches.     Pt will benefit from skilled therapeutic intervention in order to improve on the following deficits Pain;Postural dysfunction;Other (comment);Impaired flexibility   Rehab Potential Good   PT Frequency 2x / week   PT Duration 8 weeks   PT Treatment/Interventions ADLs/Self Care Home Management;Cryotherapy;Electrical Stimulation;Traction;Moist Heat;Therapeutic activities;Therapeutic exercise;Neuromuscular re-education;Patient/family education;Passive range of motion;Manual techniques;Dry needling   PT Next Visit Plan continue with gentle flexibility cervical and postural strength.  Pain management.        Problem List Patient Active Problem List   Diagnosis Date Noted  . Craniofacial pain 06/25/2014  . Occipital neuralgia of left side 05/23/2014  . Weakness of left hand 05/23/2014  . Weakness of left arm 05/23/2014  . Radiculopathy of cervical region 05/23/2014  . Essential hypertension 12/01/2013  . Ear noise/buzzing 12/01/2013  . Palpitations  12/01/2012  . COPD GOLD I 01/27/2012  . Smoker 09/19/2011  . Cough 09/18/2011    Romyn Boswell, PT 08/09/2014, 8:44 AM  [facility] [address], [place], [place], [address] Phone: [phone]   Fax:  [phone]

## 2014-08-11 ENCOUNTER — Encounter: Payer: Medicare Other | Admitting: Physical Therapy

## 2014-08-15 ENCOUNTER — Ambulatory Visit: Payer: Medicare Other | Attending: Neurology

## 2014-08-15 DIAGNOSIS — G44229 Chronic tension-type headache, not intractable: Secondary | ICD-10-CM | POA: Insufficient documentation

## 2014-08-15 DIAGNOSIS — M436 Torticollis: Secondary | ICD-10-CM | POA: Diagnosis not present

## 2014-08-15 NOTE — Therapy (Signed)
[facility] [address], [place], [place], [address] Phone: [phone]   Fax:  [phone]  Physical Therapy Treatment  Patient Details  Name: Bridget Mcdonald MRN: [id number] Date of Birth: [birth year] Referring Provider:  Melvenia Beam, MD  Encounter Date: 08/15/2014      PT End of Session - 08/15/14 0832    Visit Number 6   Number of Visits 10  Medicare   Date for PT Re-Evaluation 08/31/14   PT Start Time 0758   PT Stop Time 0846   PT Time Calculation (min) 48 min   Activity Tolerance Patient tolerated treatment well   Behavior During Therapy [facility] for tasks assessed/performed      Past Medical History  Diagnosis Date  . History of cardiac arrhythmia   . Hypertension   . High cholesterol   . Chronic headaches   . Allergy history unknown   . Hypothyroidism   . Palpitations 12/01/2012    Echocardiogram - 7/13-normal EF, trace valvular lesions, mildly AI  . Cancer     skin, breast (left)  . Barrett's esophagus     dx'd [place]  . Acid reflux   . Asthma, mild intermittent   . Migraine headache   . PVC's (premature ventricular contractions)   . 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 Hyperlipidemia   . IBS (irritable bowel syndrome)   . Vestibular neuronitis     herpetic  . Osteopenia   . Cyst of left kidney     mult-locular Dr.Dahlstedt  . Hematuria     Dr. Zannie Cove  . History of colon polyps     04/2009 [place] unknown histology  . Incontinence, feces     09/2012  . Hypotonia     [name] 09/2012    Past Surgical History  Procedure Laterality Date  . Breast lumpectomy  2005?     left  . Tubal ligation    . Oophorectomy  1968?  Marland Kitchen Tonsilectomy, adenoidectomy, bilateral myringotomy and tubes  1961  . Appendectomy  1964  . Skin surgery  08/2012  . Colonoscopy  04/2009    There were no vitals filed for this visit.  Visit Diagnosis:  Stiffness of neck  Chronic  tension-type headache, not intractable      Subjective Assessment - 08/15/14 0754    Subjective I woke up with a terrible headache 4 days ago.  No known cause.     Currently in Pain? Yes   Pain Score 3   rated 9/10 over the weekend   Pain Location Head   Pain Orientation Left   Pain Descriptors / Indicators Aching;Shooting   Pain Type Chronic pain   Pain Onset More than a month ago   Pain Frequency Intermittent   Aggravating Factors  unknown regarding headache pain   Pain Relieving Factors laying down, medication            [facility] PT Assessment - 08/15/14 0001    Observation/Other Assessments   Focus on Therapeutic Outcomes (FOTO)  37% limitation                     [facility] Adult PT Treatment/Exercise - 08/15/14 0001    Neck Exercises: Supine   Other Supine Exercise supine on foam roll with decompression x 5 minutes  PT present to discuss progress   Shoulder Exercises: Supine   Horizontal ABduction Strengthening;Both;20 reps   Theraband Level (Shoulder Horizontal ABduction)  Level 1 (Yellow)  on foam roll   Moist Heat Therapy   Number Minutes Moist Heat 15 Minutes   Moist Heat Location Cervical   Electrical Stimulation   Electrical Stimulation Location neck   Electrical Stimulation Action IFC   Electrical Stimulation Parameters 15 min   Electrical Stimulation Goals Pain   Manual Therapy   Manual Therapy Soft tissue mobilization   Manual therapy comments to bil cervical paraspinals and UT with PROM for elongation                  PT Short Term Goals - 08/15/14 0803    PT SHORT TERM GOAL #2   Title report a 30% reduction in the frequency and intensity of headaches   Time 4   Period Weeks   Status On-going  flare-up over the weekend           PT Long Term Goals - 08/15/14 0803    PT LONG TERM GOAL #1   Title be independent in advanced HEP   Time 8   Period Weeks   Status On-going   PT LONG TERM GOAL #2   Title reduce FOTO to < or =  to 33% limitation   Time 8   Period Weeks   Status On-going  37% limitation               Plan - 08/15/14 0820    Clinical Impression Statement Pt had a flare-up of headache pain over the weekend without known cause.  FOTO score is 37% limitation vs 39% limitation at evaluation.  Pt reports that she is able to better manage pain once it begins.  See goals for status.  Pt  will benefit from continued PT for trigger point release, pain mangement and postural strength.    Pt will benefit from skilled therapeutic intervention in order to improve on the following deficits Pain;Postural dysfunction;Other (comment);Impaired flexibility   Rehab Potential Good   PT Frequency 2x / week   PT Duration 8 weeks   PT Treatment/Interventions ADLs/Self Care Home Management;Cryotherapy;Electrical Stimulation;Traction;Moist Heat;Therapeutic activities;Therapeutic exercise;Neuromuscular re-education;Patient/family education;Passive range of motion;Manual techniques;Dry needling   PT Next Visit Plan continue with gentle flexibility cervical and postural strength.  Pain management.        Problem List Patient Active Problem List   Diagnosis Date Noted  . Craniofacial pain 06/25/2014  . Occipital neuralgia of left side 05/23/2014  . Weakness of left hand 05/23/2014  . Weakness of left arm 05/23/2014  . Radiculopathy of cervical region 05/23/2014  . Essential hypertension 12/01/2013  . Ear noise/buzzing 12/01/2013  . Palpitations 12/01/2012  . COPD GOLD I 01/27/2012  . Smoker 09/19/2011  . Cough 09/18/2011    Charlii Yost, PT 08/15/2014, 8:35 AM  [facility] [address], [place], [place], [address] Phone: [phone]   Fax:  [phone]

## 2014-08-18 ENCOUNTER — Ambulatory Visit: Payer: Medicare Other

## 2014-08-18 DIAGNOSIS — G44229 Chronic tension-type headache, not intractable: Secondary | ICD-10-CM

## 2014-08-18 DIAGNOSIS — M436 Torticollis: Secondary | ICD-10-CM | POA: Diagnosis not present

## 2014-08-18 NOTE — Therapy (Signed)
[facility] [address] [place], [place], [address] Phone: [phone]   Fax:  [phone]  Physical Therapy Treatment  Patient Details  Name: Bridget Mcdonald MRN: [id number] Date of Birth: 13-Feb-1945 Referring Provider:  Melvenia Beam, MD  Encounter Date: 08/18/2014      PT End of Session - 08/18/14 1438    Visit Number 7   Number of Visits 10   Date for PT Re-Evaluation 08/31/14   PT Start Time 1401   PT Stop Time 1450   PT Time Calculation (min) 49 min   Activity Tolerance Patient tolerated treatment well   Behavior During Therapy [facility] for tasks assessed/performed      Past Medical History  Diagnosis Date  . History of cardiac arrhythmia   . Hypertension   . High cholesterol   . Chronic headaches   . Allergy history unknown   . Hypothyroidism   . Palpitations 12/01/2012    Echocardiogram - 7/13-normal EF, trace valvular lesions, mildly AI  . Cancer     skin, breast (left)  . Barrett's esophagus     dx'd in [place]  . Acid reflux   . Asthma, mild intermittent   . Migraine headache   . PVC's (premature ventricular contractions)   . 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 Hyperlipidemia   . IBS (irritable bowel syndrome)   . Vestibular neuronitis     herpetic  . Osteopenia   . Cyst of left kidney     mult-locular Dr.Dahlstedt  . Hematuria     Dr. Zannie Cove  . History of colon polyps     04/2009 [place] unknown histology  . Incontinence, feces     09/2012  . Hypotonia     [name] 09/2012    Past Surgical History  Procedure Laterality Date  . Breast lumpectomy  2005?     left  . Tubal ligation    . Oophorectomy  1968?  Marland Kitchen Tonsilectomy, adenoidectomy, bilateral myringotomy and tubes  1961  . Appendectomy  1964  . Skin surgery  08/2012  . Colonoscopy  04/2009    There were no vitals filed for this visit.  Visit Diagnosis:  Stiffness of neck  Chronic tension-type  headache, not intractable      Subjective Assessment - 08/18/14 1405    Subjective Pt reports that she is getting a "zinging" pain in the shoulder blade area with "certain movements"   Currently in Pain? Yes   Pain Score 3    Pain Location Head   Pain Orientation Left   Pain Descriptors / Indicators Aching   Pain Type Chronic pain                         [facility] Adult PT Treatment/Exercise - 08/18/14 0001    Neck Exercises: Supine   Other Supine Exercise supine on foam roll with decompression x 5 minutes  PT present to discuss progress   Shoulder Exercises: Supine   Horizontal ABduction Strengthening;Both;20 reps   Theraband Level (Shoulder Horizontal ABduction) Level 1 (Yellow)  on foam roll   Shoulder Exercises: Prone   Other Prone Exercises Child's pose and lateral prayer stretch 3x20 seconds   Moist Heat Therapy   Number Minutes Moist Heat 15 Minutes   Moist Heat Location Cervical   Electrical Stimulation   Electrical Stimulation Location neck   Electrical Stimulation Action IFC   Electrical Stimulation Parameters 15  Electrical Stimulation Goals Pain   Manual Therapy   Manual Therapy Soft tissue mobilization   Manual therapy comments to bil cervical paraspinals and UT with PROM for elongation                PT Education - 08/18/14 1417    Education provided Yes   Education Details HEP: prayer stretch and lateral prayer stretch   Person(s) Educated Patient   Methods Explanation;Demonstration   Comprehension Verbalized understanding;Returned demonstration          PT Short Term Goals - 08/15/14 0803    PT SHORT TERM GOAL #2   Title report a 30% reduction in the frequency and intensity of headaches   Time 4   Period Weeks   Status On-going  flare-up over the weekend           PT Long Term Goals - 08/15/14 0803    PT LONG TERM GOAL #1   Title be independent in advanced HEP   Time 8   Period Weeks   Status On-going   PT LONG  TERM GOAL #2   Title reduce FOTO to < or = to 33% limitation   Time 8   Period Weeks   Status On-going  37% limitation               Plan - 08/18/14 1438    Clinical Impression Statement Pt with continued neck and headache pain that is fairly constant.  Pt responds well to manual therapy and modalities.  FOTO score is 37% limitation.  Pt will benfit from continued PT for trigger point release, pain management and posutral strength.     Pt will benefit from skilled therapeutic intervention in order to improve on the following deficits Pain;Postural dysfunction;Other (comment);Impaired flexibility   Rehab Potential Good   PT Frequency 2x / week   PT Duration 8 weeks   PT Treatment/Interventions ADLs/Self Care Home Management;Cryotherapy;Electrical Stimulation;Traction;Moist Heat;Therapeutic activities;Therapeutic exercise;Neuromuscular re-education;Patient/family education;Passive range of motion;Manual techniques;Dry needling   PT Next Visit Plan continue with gentle flexibility cervical and postural strength.  Pain management.   Consulted and Agree with Plan of Care Patient        Problem List Patient Active Problem List   Diagnosis Date Noted  . Craniofacial pain 06/25/2014  . Occipital neuralgia of left side 05/23/2014  . Weakness of left hand 05/23/2014  . Weakness of left arm 05/23/2014  . Radiculopathy of cervical region 05/23/2014  . Essential hypertension 12/01/2013  . Ear noise/buzzing 12/01/2013  . Palpitations 12/01/2012  . COPD GOLD I 01/27/2012  . Smoker 09/19/2011  . Cough 09/18/2011    TAKACS,KELLY, PT 08/18/2014, 2:40 PM  [facility] [address] [place], [place], [address] Phone: [phone]   Fax:  [phone]

## 2014-08-18 NOTE — Patient Instructions (Signed)
  Child Pose   Sitting on knees, fold body over legs and relax head and arms on floor. Hold for _20___ breaths.  Do 3 reps.  1-2 times a day.  Copyright  VHI. All rights reserved.  Side Waist Stretch from Child's Pose  From child's pose, walk hands to left. Reach right hand out on diagonal. Reach hips back toward heels making a C with torso. Breathe into right side waist. Hold for _20___ breaths. Repeat _3___ times each side.  1-2 times a day.  Copyright  VHI. All rights reserved.   [place] [address], [place], [place] [address] Phone # [phone] Fax [phone]

## 2014-08-23 ENCOUNTER — Encounter: Payer: Self-pay | Admitting: Physical Therapy

## 2014-08-23 ENCOUNTER — Ambulatory Visit: Payer: Medicare Other | Admitting: Physical Therapy

## 2014-08-23 DIAGNOSIS — G44229 Chronic tension-type headache, not intractable: Secondary | ICD-10-CM | POA: Diagnosis not present

## 2014-08-23 DIAGNOSIS — M436 Torticollis: Secondary | ICD-10-CM | POA: Diagnosis not present

## 2014-08-23 NOTE — Therapy (Signed)
[facility] [address] [place], [place], [address] Phone: [phone]   Fax:  [phone]  Physical Therapy Treatment  Patient Details  Name: Bridget Mcdonald MRN: [id number] Date of Birth: 01-Jun-1945 Referring Provider:  Melvenia Beam, MD  Encounter Date: 08/23/2014      PT End of Session - 08/23/14 0909    Visit Number 8   Number of Visits 10   Date for PT Re-Evaluation 08/31/14   PT Start Time 3710   PT Stop Time 0946   PT Time Calculation (min) 62 min   Activity Tolerance Patient tolerated treatment well   Behavior During Therapy [facility] for tasks assessed/performed      Past Medical History  Diagnosis Date  . History of cardiac arrhythmia   . Hypertension   . High cholesterol   . Chronic headaches   . Allergy history unknown   . Hypothyroidism   . Palpitations 12/01/2012    Echocardiogram - 7/13-normal EF, trace valvular lesions, mildly AI  . Cancer     skin, breast (left)  . Barrett's esophagus     dx'd in [place]09  . Acid reflux   . Asthma, mild intermittent   . Migraine headache   . PVC's (premature ventricular contractions)   . 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 Hyperlipidemia   . IBS (irritable bowel syndrome)   . Vestibular neuronitis     herpetic  . Osteopenia   . Cyst of left kidney     mult-locular Dr.Dahlstedt  . Hematuria     Dr. Zannie Cove  . History of colon polyps     04/2009 [place] unknown histology  . Incontinence, feces     09/2012  . Hypotonia     [name] 09/2012    Past Surgical History  Procedure Laterality Date  . Breast lumpectomy  2005?     left  . Tubal ligation    . Oophorectomy  1968?  Marland Kitchen Tonsilectomy, adenoidectomy, bilateral myringotomy and tubes  1961  . Appendectomy  1964  . Skin surgery  08/2012  . Colonoscopy  04/2009    There were no vitals filed for this visit.  Visit Diagnosis:  Stiffness of neck  Chronic tension-type  headache, not intractable      Subjective Assessment - 08/23/14 0853    Subjective Pain is still in the shoulder blade and in the left forehead area headache   Currently in Pain? Yes   Pain Score 2    Pain Location Head   Pain Orientation Left   Pain Descriptors / Indicators Aching   Pain Type Chronic pain   Pain Onset More than a month ago   Multiple Pain Sites No                         [facility] Adult PT Treatment/Exercise - 08/23/14 0001    Neck Exercises: Supine   Other Supine Exercise supine on foam roll with decompression x 5 minutes  Pt present to discuss progress   Other Supine Exercise Trunk rotation with focus on cervical rotation x3 20sec  each side   Shoulder Exercises: Supine   Horizontal ABduction Strengthening;Both;20 reps   Theraband Level (Shoulder Horizontal ABduction) Level 1 (Yellow)  on foam roll   Shoulder Exercises: Prone   Other Prone Exercises Child's pose and lateral prayer stretch 3x20 seconds   Shoulder Exercises: Stretch   Corner Stretch 3 reps;20 seconds  x 2 with focus on neutral position of head   Moist Heat Therapy   Number Minutes Moist Heat 15 Minutes   Moist Heat Location Cervical   Electrical Stimulation   Electrical Stimulation Location neck   Electrical Stimulation Action IFC   Electrical Stimulation Parameters 15   Electrical Stimulation Goals Pain   Manual Therapy   Manual Therapy Soft tissue mobilization   Manual therapy comments to bil cervical paraspinals and UT with PROM for elongation                  PT Short Term Goals - 08/23/14 0911    PT SHORT TERM GOAL #1   Title be independent in initial HEP   Time 4   Period Weeks   Status Achieved   PT SHORT TERM GOAL #2   Title report a 30% reduction in the frequency and intensity of headaches   Time 4   Period Weeks   Status On-going   PT SHORT TERM GOAL #3   Title demonstate netural seated posture and report compliance with computer use at home  at least 50% of the time   Time 4   Period Weeks   Status Achieved           PT Long Term Goals - 08/23/14 0913    PT LONG TERM GOAL #4   Title --  50% less frequent, less intense               Plan - 08/23/14 0934    Clinical Impression Statement Pt with continued neck and headache. pt responds well to manual therapy and modalities. Pt will continue to benefit from skilled PT to address tightness in cervical and upper back   Pt will benefit from skilled therapeutic intervention in order to improve on the following deficits Pain;Postural dysfunction;Other (comment);Impaired flexibility   Rehab Potential Good   PT Frequency 2x / week   PT Duration 8 weeks   PT Treatment/Interventions ADLs/Self Care Home Management;Cryotherapy;Electrical Stimulation;Traction;Moist Heat;Therapeutic activities;Therapeutic exercise;Neuromuscular re-education;Patient/family education;Passive range of motion;Manual techniques;Dry needling   PT Next Visit Plan continue with gentle flexibility cervical and postural strength.  Pain management.   Consulted and Agree with Plan of Care Patient        Problem List Patient Active Problem List   Diagnosis Date Noted  . Craniofacial pain 06/25/2014  . Occipital neuralgia of left side 05/23/2014  . Weakness of left hand 05/23/2014  . Weakness of left arm 05/23/2014  . Radiculopathy of cervical region 05/23/2014  . Essential hypertension 12/01/2013  . Ear noise/buzzing 12/01/2013  . Palpitations 12/01/2012  . COPD GOLD I 01/27/2012  . Smoker 09/19/2011  . Cough 09/18/2011    NAUMANN-HOUEGNIFIO,Jennavie Martinek PTA 08/23/2014, 9:35 AM  [facility] [address], [place], [place], [address] Phone: [phone]   Fax:  [phone]

## 2014-08-25 ENCOUNTER — Encounter: Payer: Self-pay | Admitting: Physical Therapy

## 2014-08-25 ENCOUNTER — Ambulatory Visit: Payer: Medicare Other | Admitting: Physical Therapy

## 2014-08-25 DIAGNOSIS — M436 Torticollis: Secondary | ICD-10-CM | POA: Diagnosis not present

## 2014-08-25 DIAGNOSIS — G44229 Chronic tension-type headache, not intractable: Secondary | ICD-10-CM

## 2014-08-25 NOTE — Therapy (Signed)
[facility] [address], [place], [place], [address] Phone: [phone]   Fax:  [phone]  Physical Therapy Treatment  Patient Details  Name: Bridget Mcdonald MRN: [id number] Date of Birth: Nov 27, 1945 Referring Provider:  Melvenia Beam, MD  Encounter Date: 08/25/2014      PT End of Session - 08/25/14 0854    Visit Number 9   Number of Visits 10   Date for PT Re-Evaluation 08/31/14   PT Start Time 0845   PT Stop Time 0947   PT Time Calculation (min) 62 min   Activity Tolerance Patient tolerated treatment well   Behavior During Therapy [facility] for tasks assessed/performed      Past Medical History  Diagnosis Date  . History of cardiac arrhythmia   . Hypertension   . High cholesterol   . Chronic headaches   . Allergy history unknown   . Hypothyroidism   . Palpitations 12/01/2012    Echocardiogram - 7/13-normal EF, trace valvular lesions, mildly AI  . Cancer     skin, breast (left)  . Barrett's esophagus     [place]'d [place]  . Acid reflux   . Asthma, mild intermittent   . Migraine headache   . PVC's (premature ventricular contractions)   . 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 Hyperlipidemia   . IBS (irritable bowel syndrome)   . Vestibular neuronitis     herpetic  . Osteopenia   . Cyst of left kidney     mult-locular Dr.Dahlstedt  . Hematuria     Dr. Zannie Cove  . History of colon polyps     04/2009 [place] unknown histology  . Incontinence, feces     09/2012  . Hypotonia     [name] 09/2012    Past Surgical History  Procedure Laterality Date  . Breast lumpectomy  2005?     left  . Tubal ligation    . Oophorectomy  1968?  Marland Kitchen Tonsilectomy, adenoidectomy, bilateral myringotomy and tubes  1961  . Appendectomy  1964  . Skin surgery  08/2012  . Colonoscopy  04/2009    There were no vitals filed for this visit.  Visit Diagnosis:  Stiffness of neck  Chronic tension-type  headache, not intractable      Subjective Assessment - 08/25/14 0852    Subjective Since yesterday morning headache in left forehead, tried exercsises but nothing seems to help   Currently in Pain? Yes   Pain Score 5    Pain Location Head   Pain Orientation Left   Pain Descriptors / Indicators Aching   Pain Type Chronic pain   Pain Onset More than a month ago   Pain Frequency Constant   Multiple Pain Sites No                         [facility] Adult PT Treatment/Exercise - 08/25/14 0001    Neck Exercises: Supine   Capital Flexion 20 reps  with small towelroll under OA   Other Supine Exercise supine on foam roll with decompression x 5 minutes   Other Supine Exercise Trunk rotation with focus on cervical rotation x3 20sec   Shoulder Exercises: Supine   Other Supine Exercises Thoracic selfmob with towelroll x 3 min   Shoulder Exercises: Stretch   Corner Stretch 3 reps;20 seconds  3 reps x 2   Moist Heat Therapy   Number Minutes Moist Heat 15 Minutes  Moist Heat Location Cervical   Electrical Stimulation   Electrical Stimulation Location neck   Electrical Stimulation Action IFC   Electrical Stimulation Parameters 15   Electrical Stimulation Goals Pain   Manual Therapy   Manual Therapy Soft tissue mobilization   Manual therapy comments to bil cervical paraspinals and UT with PROM for elongation                  PT Short Term Goals - 08/23/14 0911    PT SHORT TERM GOAL #1   Title be independent in initial HEP   Time 4   Period Weeks   Status Achieved   PT SHORT TERM GOAL #2   Title report a 30% reduction in the frequency and intensity of headaches   Time 4   Period Weeks   Status On-going   PT SHORT TERM GOAL #3   Title demonstate netural seated posture and report compliance with computer use at home at least 50% of the time   Time 4   Period Weeks   Status Achieved           PT Long Term Goals - 08/23/14 0913    PT LONG TERM GOAL #4    Title --  50% less frequent, less intense               Plan - 08/25/14 3335    Clinical Impression Statement Pt with continued neck and headache, pt has relieve from manual therapy and modalities. Pt will continue to benefit from skilled PT to address tighness in cervical and upper back to relieve headaches   Pt will benefit from skilled therapeutic intervention in order to improve on the following deficits Pain;Postural dysfunction;Other (comment);Impaired flexibility   Rehab Potential Good   PT Frequency 2x / week   PT Duration 8 weeks   PT Treatment/Interventions ADLs/Self Care Home Management;Cryotherapy;Electrical Stimulation;Traction;Moist Heat;Therapeutic activities;Therapeutic exercise;Neuromuscular re-education;Patient/family education;Passive range of motion;Manual techniques;Dry needling   PT Next Visit Plan continue with gentle flexibility cervical and postural strength.  Pain management.   Consulted and Agree with Plan of Care Patient        Problem List Patient Active Problem List   Diagnosis Date Noted  . Craniofacial pain 06/25/2014  . Occipital neuralgia of left side 05/23/2014  . Weakness of left hand 05/23/2014  . Weakness of left arm 05/23/2014  . Radiculopathy of cervical region 05/23/2014  . Essential hypertension 12/01/2013  . Ear noise/buzzing 12/01/2013  . Palpitations 12/01/2012  . COPD GOLD I 01/27/2012  . Smoker 09/19/2011  . Cough 09/18/2011    NAUMANN-HOUEGNIFIO,Solash Tullo PTA 08/25/2014, 9:33 AM  [facility] [address] [place], [place], [address] Phone: [phone]   Fax:  [phone]

## 2014-08-29 ENCOUNTER — Ambulatory Visit: Payer: Medicare Other | Admitting: Physical Therapy

## 2014-08-29 ENCOUNTER — Encounter: Payer: Self-pay | Admitting: Physical Therapy

## 2014-08-29 DIAGNOSIS — M436 Torticollis: Secondary | ICD-10-CM | POA: Diagnosis not present

## 2014-08-29 DIAGNOSIS — G44229 Chronic tension-type headache, not intractable: Secondary | ICD-10-CM

## 2014-08-29 NOTE — Therapy (Addendum)
[facility] [address] [place], [place], [address] Phone: [phone]   Fax:  [phone]  Physical Therapy Treatment  Patient Details  Name: Bridget Mcdonald MRN: [id number] Date of Birth: [birth year] Referring Provider:  Melvenia Beam, MD  Encounter Date: 08/29/2014      PT End of Session - 08/29/14 0842    Visit Number 10   Number of Visits 10   Date for PT Re-Evaluation 08/31/14   PT Start Time 6962   PT Stop Time 0905   PT Time Calculation (min) 70 min   Activity Tolerance Patient tolerated treatment well   Behavior During Therapy [facility] for tasks assessed/performed      Past Medical History  Diagnosis Date  . History of cardiac arrhythmia   . Hypertension   . High cholesterol   . Chronic headaches   . Allergy history unknown   . Hypothyroidism   . Palpitations 12/01/2012    Echocardiogram - 7/13-normal EF, trace valvular lesions, mildly AI  . Cancer     skin, breast (left)  . Barrett's esophagus     dx'd [place]  . Acid reflux   . Asthma, mild intermittent   . Migraine headache   . PVC's (premature ventricular contractions)   . 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 Hyperlipidemia   . IBS (irritable bowel syndrome)   . Vestibular neuronitis     herpetic  . Osteopenia   . Cyst of left kidney     mult-locular Dr.Dahlstedt  . Hematuria     Dr. Zannie Cove  . History of colon polyps     04/2009 [place] unknown histology  . Incontinence, feces     09/2012  . Hypotonia     [name] 09/2012    Past Surgical History  Procedure Laterality Date  . Breast lumpectomy  2005?     left  . Tubal ligation    . Oophorectomy  1968?  Marland Kitchen Tonsilectomy, adenoidectomy, bilateral myringotomy and tubes  1961  . Appendectomy  1964  . Skin surgery  08/2012  . Colonoscopy  04/2009    There were no vitals filed for this visit.  Visit Diagnosis:  Chronic tension-type headache, not  intractable  Stiffness of neck      Subjective Assessment - 08/29/14 0812    Subjective Yesterday had "weirdo" left anterior lobe pain that shot into the ear. Had reported swellling in that area and a feeling of numbness. Seeing MD next Monday.   Currently in Pain? Yes  It is just there.   Pain Score 2    Pain Location Head   Pain Orientation Left   Pain Descriptors / Indicators Dull   Aggravating Factors  unknown   Pain Relieving Factors meds maybe   Multiple Pain Sites No            [facility] PT Assessment - 08/29/14 0001    Observation/Other Assessments   Focus on Therapeutic Outcomes (FOTO)  37% limited Cj all 3 categories   AROM   Overall AROM  --  Rotation RT WNL, LT 25% limited; Lateral bend 50% loss   Strength   Overall Strength Comments 4+/5 Bil UE                      [facility] Adult PT Treatment/Exercise - 08/29/14 0001    Neck Exercises: Supine   Other Supine Exercise MELT protocol on soft foam roll  Moist Heat Therapy   Number Minutes Moist Heat 15 Minutes   Moist Heat Location Cervical   Electrical Stimulation   Electrical Stimulation Location neck   Electrical Stimulation Action IFC   Electrical Stimulation Parameters 15   Electrical Stimulation Goals Pain   Manual Therapy   Manual Therapy Soft tissue mobilization   Manual therapy comments to bil cervical paraspinals and UT with PROM for elongation                  PT Short Term Goals - September 22, 2014 0841    PT SHORT TERM GOAL #2   Title report a 30% reduction in the frequency and intensity of headaches   Time 4   Status Achieved  50% with some inconsistent feelings throughout the LT lobe           PT Long Term Goals - Sep 22, 2014 0807    PT LONG TERM GOAL #1   Title be independent in advanced HEP   Time 8   Period Weeks   Status On-going               Plan - Sep 22, 2014 7544    Clinical Impression Statement All STGs met this week. ROM to the LT limited compared to RT.  Seeing MD next Monday. Improvement since eval with less pain, but still complains of LT lobe symptoms that change.    Pt will benefit from skilled therapeutic intervention in order to improve on the following deficits Pain;Postural dysfunction;Other (comment);Impaired flexibility   Rehab Potential Good   PT Frequency 2x / week   PT Duration 8 weeks   PT Treatment/Interventions ADLs/Self Care Home Management;Cryotherapy;Electrical Stimulation;Traction;Moist Heat;Therapeutic activities;Therapeutic exercise;Neuromuscular re-education;Patient/family education;Passive range of motion;Manual techniques;Dry needling   PT Next Visit Plan ERO next visit then to MD next Monday   Consulted and Agree with Plan of Care Patient          G-Codes - 09-22-2014 1315    Functional Assessment Tool Used FOTO score is 37% limitation   Functional Limitation Carrying, moving and handling objects   Carrying, Moving and Handling Objects Current Status (B2010) At least 20 percent but less than 40 percent impaired, limited or restricted   Carrying, Moving and Handling Objects Goal Status (O7121) At least 20 percent but less than 40 percent impaired, limited or restricted     D/C G-codes: CJ Problem List Patient Active Problem List   Diagnosis Date Noted  . Craniofacial pain 06/25/2014  . Occipital neuralgia of left side 05/23/2014  . Weakness of left hand 05/23/2014  . Weakness of left arm 05/23/2014  . Radiculopathy of cervical region 05/23/2014  . Essential hypertension 12/01/2013  . Ear noise/buzzing 12/01/2013  . Palpitations 12/01/2012  . COPD GOLD I 01/27/2012  . Smoker 09/19/2011  . Cough 09/18/2011  09/22/14, 1:18 PM  Myrene Galas, PTA 22-Sep-2014 1:18 PM   [facility] [address] [place], [place], [address] Phone: [phone]   Fax:  [phone]     Physical Therapy Progress Note  Dates of Reporting Period: 07/06/2014 to  Sep 22, 2014  Objective Reports of Subjective Statement: 30% reduction in headaches.   Objective Measurements: right cervical rotation full, Left cervical rotation decreased by 25%, Bilateral sidebending decreased by 50%.  Goal Update: All STG's are met. Patient is still learning exercises for HEP.   Plan: Continue physical therapy 2x/wk for 1 week for reassessment.  Reason Skilled Services are Required: Continue physical therapy to address tightness in  cervical and upper back to relieve headaches.   Earlie Counts, PT 08/29/2014 2:09 PM  PHYSICAL THERAPY DISCHARGE SUMMARY  Visits from Start of Care: 10  Current functional level related to goals / functional outcomes: See above   Remaining deficits: See above   Education / Equipment: Posture, HEP Plan: Patient agrees to discharge.  Patient goals were partially met. Patient is being discharged due to not returning since the last visit.  ?????   Sigurd Sos, PT 09/20/2014 7:43 AM

## 2014-08-31 ENCOUNTER — Ambulatory Visit: Payer: Medicare Other

## 2014-09-05 ENCOUNTER — Telehealth: Payer: Self-pay | Admitting: *Deleted

## 2014-09-05 ENCOUNTER — Ambulatory Visit: Payer: Medicare Other | Admitting: Neurology

## 2014-09-05 DIAGNOSIS — K2270 Barrett's esophagus without dysplasia: Secondary | ICD-10-CM | POA: Diagnosis not present

## 2014-09-05 DIAGNOSIS — M5481 Occipital neuralgia: Secondary | ICD-10-CM | POA: Diagnosis not present

## 2014-09-05 DIAGNOSIS — E785 Hyperlipidemia, unspecified: Secondary | ICD-10-CM | POA: Diagnosis not present

## 2014-09-05 DIAGNOSIS — E039 Hypothyroidism, unspecified: Secondary | ICD-10-CM | POA: Diagnosis not present

## 2014-09-05 DIAGNOSIS — Z0000 Encounter for general adult medical examination without abnormal findings: Secondary | ICD-10-CM | POA: Diagnosis not present

## 2014-09-05 DIAGNOSIS — K580 Irritable bowel syndrome with diarrhea: Secondary | ICD-10-CM | POA: Diagnosis not present

## 2014-09-05 NOTE — Telephone Encounter (Signed)
Pt no showed f/u appointment

## 2014-09-09 ENCOUNTER — Ambulatory Visit
Admission: RE | Admit: 2014-09-09 | Discharge: 2014-09-09 | Disposition: A | Discharge status: Home / Self Care | Payer: Medicare Other | Source: Ambulatory Visit

## 2014-09-09 DIAGNOSIS — Z1231 Encounter for screening mammogram for malignant neoplasm of breast: Secondary | ICD-10-CM

## 2014-09-09 DIAGNOSIS — Z853 Personal history of malignant neoplasm of breast: Secondary | ICD-10-CM

## 2014-09-09 DIAGNOSIS — Z9889 Other specified postprocedural states: Secondary | ICD-10-CM

## 2014-09-22 DIAGNOSIS — E559 Vitamin D deficiency, unspecified: Secondary | ICD-10-CM | POA: Diagnosis not present

## 2014-09-22 DIAGNOSIS — E039 Hypothyroidism, unspecified: Secondary | ICD-10-CM | POA: Diagnosis not present

## 2014-09-22 DIAGNOSIS — R7309 Other abnormal glucose: Secondary | ICD-10-CM | POA: Diagnosis not present

## 2014-09-22 DIAGNOSIS — E785 Hyperlipidemia, unspecified: Secondary | ICD-10-CM | POA: Diagnosis not present

## 2014-10-26 DIAGNOSIS — C44311 Basal cell carcinoma of skin of nose: Secondary | ICD-10-CM | POA: Diagnosis not present

## 2014-10-27 DIAGNOSIS — L720 Epidermal cyst: Secondary | ICD-10-CM | POA: Diagnosis not present

## 2014-10-27 DIAGNOSIS — L570 Actinic keratosis: Secondary | ICD-10-CM | POA: Diagnosis not present

## 2014-12-07 ENCOUNTER — Encounter: Payer: Self-pay | Admitting: *Deleted

## 2014-12-13 ENCOUNTER — Encounter: Payer: Self-pay | Admitting: Cardiology

## 2014-12-13 ENCOUNTER — Ambulatory Visit (INDEPENDENT_AMBULATORY_CARE_PROVIDER_SITE_OTHER): Payer: Medicare Other | Admitting: Cardiology

## 2014-12-13 VITALS — BP 158/58 | HR 86 | Ht 66.0 in | Wt 145.0 lb

## 2014-12-13 DIAGNOSIS — R002 Palpitations: Secondary | ICD-10-CM | POA: Diagnosis not present

## 2014-12-13 DIAGNOSIS — I10 Essential (primary) hypertension: Secondary | ICD-10-CM

## 2014-12-13 DIAGNOSIS — R51 Headache: Secondary | ICD-10-CM | POA: Diagnosis not present

## 2014-12-13 DIAGNOSIS — R519 Headache, unspecified: Secondary | ICD-10-CM

## 2014-12-13 MED ORDER — DILTIAZEM HCL 60 MG PO TABS: 60.0000 mg | ORAL_TABLET | Freq: Four times a day (QID) | ORAL | Status: DC | PRN

## 2014-12-13 MED ORDER — DILTIAZEM HCL ER COATED BEADS 240 MG PO CP24: 240.0000 mg | ORAL_CAPSULE | Freq: Every day | ORAL | Status: DC

## 2014-12-13 NOTE — Patient Instructions (Signed)
Your physician has recommended you make the following change in your medication:  1.) diltiazem 60 mg tablet--take one every 6 hours as needed for palpitations  Your physician wants you to follow-up in: 1 year with Dr. Marlou Porch.  You will receive a reminder letter in the mail two months in advance. If you don't receive a letter, please call our office to schedule the follow-up appointment.

## 2014-12-13 NOTE — Progress Notes (Signed)
[place]. [address], [place], [place]  [address] Phone: [phone] Fax:  [phone]  Date:  12/13/2014   ID:  Gracie Pano, DOB June 23, 1945, MRN [id number]  PCP:  Aretta Nip, MD   History of Present Illness: Bridget Mcdonald is a 69 y.o. female with palpitations here for followup. Her cardiac workup includes nuclear stress test negative for ischemia, normal echocardiogram, Holter monitor showing frequent PVCs. She's been taking diltiazem 240 mg once a day. She did have 2 episodes. Bizarre. Went to reach down to pick something up and it started.  Likely vagal mediated.  When turning to right. Boom boom.   Blood pressure overall has been good control. Slightly nervous today.  No associated weakness, no seizures.   she has been battling left-sided headaches. She has seen neurology. MRA/MRI. 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 She tried physical therapy.   She had another sensation of palpitations when reaching to put her luggage away. Once again likely vagally mediated.    Wt Readings from Last 3 Encounters:  12/13/14 145 lb (65.772 kg)  06/20/14 141 lb 12.8 oz (64.32 kg)  05/23/14 143 lb (64.864 kg)     Past Medical History  Diagnosis Date  . History of cardiac arrhythmia   . Hypertension   . High cholesterol   . Chronic headaches   . Allergy history unknown   . Hypothyroidism   . Palpitations 12/01/2012    Echocardiogram - 7/13-normal EF, trace valvular lesions, mildly AI  . Cancer ([place])     skin, breast (left)  . Barrett's esophagus     dx'd [place]  . Acid reflux   . Asthma, mild intermittent   . Migraine headache   . PVC's (premature ventricular contractions)   . 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 Hyperlipidemia   . IBS (irritable bowel syndrome)   . Vestibular neuronitis     herpetic  . Osteopenia   . Cyst of left kidney     mult-locular Dr.Dahlstedt  . Hematuria     Dr. Zannie Cove    . History of colon polyps     04/2009 [place] unknown histology  . Incontinence, feces     09/2012  . Hypotonia     [name] 09/2012    Past Surgical History  Procedure Laterality Date  . Breast lumpectomy  2005?     left  . Tubal ligation    . Oophorectomy  1968?  Marland Kitchen Tonsilectomy, adenoidectomy, bilateral myringotomy and tubes  1961  . Appendectomy  1964  . Skin surgery  08/2012  . Colonoscopy  04/2009    Current Outpatient Prescriptions  Medication Sig Dispense Refill  . alendronate (FOSAMAX) 70 MG tablet Take 70 mg by mouth once a week. Take with a full glass of water on an empty stomach.    Marland Kitchen atorvastatin (LIPITOR) 10 MG tablet Take 10 mg by mouth daily.    Marland Kitchen diltiazem (CARDIZEM CD) 240 MG 24 hr capsule TAKE ONE CAPSULE BY MOUTH EVERY DAY 90 capsule 2  . hyoscyamine (NULEV) 0.125 MG TBDP disintergrating tablet Place 0.125 mg under the tongue every 4 (four) hours as needed.    Marland Kitchen levothyroxine (SYNTHROID, LEVOTHROID) 175 MCG tablet Take 175 mcg by mouth daily.  0  . loperamide (IMODIUM) 1 MG/5ML solution Take 2 mg by mouth as needed for diarrhea or loose stools.    . Multiple  Vitamin (MULTIVITAMIN) tablet Take 1 tablet by mouth daily. Nature's way    . Omega-3 Fatty Acids (OMEGA-3 EPA FISH OIL PO) Take 1,400 mg by mouth daily.    . vitamin C (ASCORBIC ACID) 500 MG tablet Take 500 mg by mouth daily.     No current facility-administered medications for this visit.    Allergies:    Allergies  Allergen Reactions  . Demerol [Meperidine]     hallucinations  . Epinephrine     Super hyper  . Quinine Derivatives     deaf    Social History:  The patient  reports that she has been smoking Cigarettes.  She has a 75 pack-year smoking history. She has never used smokeless tobacco. She reports that she drinks alcohol. She reports that she does not use illicit drugs.   ROS:  Please see the history of present illness.   No CP, no SOB. Rushing in left ear at times.     PHYSICAL  EXAM: VS:  BP 158/58 mmHg  Pulse 86  Ht 5\' 6"  (1.676 m)  Wt 145 lb (65.772 kg)  BMI 23.41 kg/m2  SpO2 95% Well nourished, well developed, in no acute distress HEENT: normal Neck: no JVD Cardiac:  normal S1, S2; RRR; no murmur Lungs:  clear to auscultation bilaterally, no wheezing, rhonchi or rales Abd: soft, nontender, no hepatomegaly Ext: no edema2+ PT pulses. Varicose veins. Skin: warm and dry Neuro: no focal abnormalities noted  EKG:   Today 12/13/14-sinus rhythm, 86 , no other abnormalities. Personally viewed-prior11/18/15-sinus rhythm 86 bpm with no other abnormalities prior NSR 67    ASSESSMENT AND PLAN:  1. Palpitations - much improved on diltiazem. We will continue. She still occasionally will have a racing sensation that may be vagal mediated. I have given her diltiazem 60 mg every 6 hours when necessary. She may have had the sensation of PVC previously. Prior nuclear stress test in 2013 reassuring. Echocardiogram reassuring in 2013. 2. Hypertension-usually her blood pressure is well controlled. No changes made to her regimen today. She was slightly anxious. 3.  headaches- left-sided upper head pain. She is working with neurology on this.  4. 1 year follow up  Signed, Candee Furbish, MD [facility]  12/13/2014 3:11 PM

## 2014-12-21 DIAGNOSIS — L720 Epidermal cyst: Secondary | ICD-10-CM | POA: Diagnosis not present

## 2014-12-21 DIAGNOSIS — D485 Neoplasm of uncertain behavior of skin: Secondary | ICD-10-CM | POA: Diagnosis not present

## 2014-12-26 DIAGNOSIS — F418 Other specified anxiety disorders: Secondary | ICD-10-CM | POA: Diagnosis not present

## 2014-12-26 DIAGNOSIS — Z23 Encounter for immunization: Secondary | ICD-10-CM | POA: Diagnosis not present

## 2014-12-27 ENCOUNTER — Encounter: Payer: Self-pay | Admitting: Neurology

## 2014-12-27 ENCOUNTER — Ambulatory Visit (INDEPENDENT_AMBULATORY_CARE_PROVIDER_SITE_OTHER): Payer: Medicare Other | Admitting: Neurology

## 2014-12-27 VITALS — BP 131/69 | HR 91 | Ht 66.0 in | Wt 145.6 lb

## 2014-12-27 DIAGNOSIS — G4451 Hemicrania continua: Secondary | ICD-10-CM | POA: Diagnosis not present

## 2014-12-27 DIAGNOSIS — R51 Headache: Secondary | ICD-10-CM

## 2014-12-27 DIAGNOSIS — R519 Headache, unspecified: Secondary | ICD-10-CM

## 2014-12-27 MED ORDER — INDOMETHACIN 25 MG PO CAPS: ORAL_CAPSULE | ORAL | Status: DC

## 2014-12-27 NOTE — Progress Notes (Signed)
[facility]   Provider: Dr Jaynee Eagles Referring Provider: Aretta Nip, MD Primary Care Physician: Milagros Evener, MD  CC: headache  Interval update 12/27/2014: She had 2 weird episodes. She was in the doctor's office anmd she had a strange sensation that her eye ball was being pulled down and out and she didn't lose vision but it wasn't registering, she could see and it only lasted instantly and felt drained. And the artery on the left temporal lobe gets bulbous and throbbing and painful. She has had normal crp/esr in the past, she declines that testing today. She has tenderness there in the temple area and a feeling of swishing warm feeling across her head and a stabbing pain in the inner ear. One time only. Went away. She gets congestion with her headaches. The occipital headaches were helped with physical therapy. 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 She is also having multiple other complaints and memory problems. We reviewed the MRi of her brain and cervical spine and reviewed the images together.  HPI: Bridget Mcdonald is a 69 y.o. female here as a referral from Dr. Radene Ou for headaches. She has a stabbing headache in the left fronto-parietal area. Digging her nails into the skin helps. She is starting to get it on the right as well. Today she reports that she also has facial pain in the face (points to the trigeminal and supraorbital distribution). She has burning in the occipital area on the left. She has done a lot of research, she does not understand why she has these symptoms. She has had trigeminal neuralgia in the past and these symptoms are different. No rhinorrhea, lacrimation or injection with these symptoms. She does not want to try medication.   MRi of the cervical spine: This is an abnormal MRI of the cervical spine showing multilevel degenerative changes as detailed above. There are various  combinations of uncovertebral spurring and disc bulging/protrusion at every cervical level. There is no severe foraminal narrowing and there does not appear to be any nerve root compression.   Addendum: Labs collected November 2015 include hemoglobin A1c of 5.6, normal TSH,   Labs collected August 2015 include normal CBC with differential, normal CMP, sedimentation rate 6, B12 677, LDL 87   Initial visit 05/23/2014 Bridget Mcdonald is a 69 y.o. female here as a referral from Dr. Radene Ou for headaches. Past medical history of hypertension, high cholesterol, anxiety, depression. Started December 12th. She has a history of trigeminal neuralgia. She has tenderness in the left sided scalp. She has pressure in the head. She has lightning, severe, sharp in the left parietal and occipital areas on the left. Then turns into a pressure headache all over the scalp with soething sitting on on the left frontal/paritel lobe. Headaches are every other day, they last for 5-10 minutes or all day long. Also behind the left ear. She has dizziness. She feels a fluid wave in the brain. She has photopsia. She has bilat tinnitus but no hearing loss and has had hearing checked. She gets occipital headaches. Burning in the occipital area.  Also has numbness in the toes, worsening distally.   Reviewed notes, labs and imaging from outside physicians, which showed: Recently reviewed MRI of the brain and MRA of the head with patient including images. Some mild chronic microvascular ischemic changes nonspecific. Otherwise negative. MRA of the head without significant stenosis in the intracerebral arteries.  Review of Systems: Patient complains of symptoms per HPI as  well as the following symptoms:chills, heat intolerance, mausea, insomnia, palpitations, ear pain, ringing in ears, muscle cramps, speech difficulty, tremors, depression, nervous. Pertinent negatives per HPI. All others negative. .   Social History   Social  History  . Marital Status: Single    Spouse Name: N/A  . Number of Children: 1  . Years of Education: Post Grad   Occupational History  . Retired       Secretary/administrator   Social History Main Topics  . Smoking status: Current Every Day Smoker -- 1.50 packs/day for 50 years    Types: Cigarettes  . Smokeless tobacco: Never Used  . Alcohol Use: Yes     Comment: rare  . Drug Use: No  . Sexual Activity: Not on file   Other Topics Concern  . Not on file   Social History Narrative   Lives at home with herself.   Caffeine use: 2 cups per day    Family History  Problem Relation Age of Onset  . Emphysema Mother   . Cancer Brother     prostate  . Cancer Father     prostate  . Heart disease Paternal Grandfather   . Bladder Cancer      Past Medical History  Diagnosis Date  . History of cardiac arrhythmia   . Hypertension   . High cholesterol   . Chronic headaches   . Allergy history unknown   . Hypothyroidism   . Palpitations 12/01/2012    Echocardiogram - 7/13-normal EF, trace valvular lesions, mildly AI  . Cancer ([place])     skin, breast (left)  . Barrett's esophagus     [place]'d in [place]  . Acid reflux   . Asthma, mild intermittent   . Migraine headache   . PVC's (premature ventricular contractions)   . 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 Hyperlipidemia   . IBS (irritable bowel syndrome)   . Vestibular neuronitis     herpetic  . Osteopenia   . Cyst of left kidney     mult-locular Dr.Dahlstedt  . Hematuria     Dr. Zannie Cove  . History of colon polyps     04/2009 [place] unknown histology  . Incontinence, feces     09/2012  . Hypotonia     [name] 09/2012    Past Surgical History  Procedure Laterality Date  . Breast lumpectomy  2005?     left  . Tubal ligation    . Oophorectomy  1968?  Marland Kitchen Tonsilectomy, adenoidectomy, bilateral myringotomy and tubes  1961  . Appendectomy  1964  . Skin surgery  08/2012  . Colonoscopy  04/2009    Current Outpatient  Prescriptions  Medication Sig Dispense Refill  . alendronate (FOSAMAX) 70 MG tablet Take 70 mg by mouth once a week. Take with a full glass of water on an empty stomach.    Marland Kitchen atorvastatin (LIPITOR) 10 MG tablet Take 10 mg by mouth daily.    Marland Kitchen diltiazem (CARDIZEM CD) 240 MG 24 hr capsule Take 1 capsule (240 mg total) by mouth daily. 90 capsule 3  . diltiazem (CARDIZEM) 60 MG tablet Take 1 tablet (60 mg total) by mouth every 6 (six) hours as needed. 90 tablet 3  . hyoscyamine (NULEV) 0.125 MG TBDP disintergrating tablet Place 0.125 mg under the tongue every 4 (four) hours as needed.    Marland Kitchen levothyroxine (SYNTHROID, LEVOTHROID) 175 MCG tablet Take 175 mcg by mouth daily.  0  . loperamide (  IMODIUM) 1 MG/5ML solution Take 2 mg by mouth as needed for diarrhea or loose stools.    . Multiple Vitamin (MULTIVITAMIN) tablet Take 1 tablet by mouth daily. Nature's way    . Omega-3 Fatty Acids (OMEGA-3 EPA FISH OIL PO) Take 1,400 mg by mouth daily.    . vitamin C (ASCORBIC ACID) 500 MG tablet Take 500 mg by mouth daily.     No current facility-administered medications for this visit.    Allergies as of 12/27/2014 - Review Complete 12/27/2014  Allergen Reaction Noted  . Demerol [meperidine]  09/18/2011  . Epinephrine  09/18/2011  . Quinine derivatives  09/18/2011    Vitals: BP 131/69 mmHg  Pulse 91  Ht 5' 6"  (1.676 m)  Wt 145 lb 9.6 oz (66.044 kg)  BMI 23.51 kg/m2 Last Weight:  Wt Readings from Last 1 Encounters:  12/27/14 145 lb 9.6 oz (66.044 kg)   Last Height:   Ht Readings from Last 1 Encounters:  12/27/14 5' 6"  (1.676 m)    Neuro: Detailed Neurologic Exam  Speech:  Speech is normal; fluent and spontaneous with normal comprehension.  Cognition:  The patient is oriented to person, place, and time;   recent and remote memory intact;   language fluent;   normal attention, concentration,   fund of knowledge Cranial Nerves:  The pupils are equal, round, and reactive  to light. Visual fields are full to finger confrontation. Extraocular movements are intact. Trigeminal sensation is intact and the muscles of mastication are normal. The face is symmetric. The palate elevates in the midline. Hearing intact. Voice is normal. Shoulder shrug is normal. The tongue has normal motion without fasciculations.     Assessment/Plan: 69 year old female with left-sided headaches since December. Neuro exam in non focal.She has a stabbing headache in the left fronto-parietal area. "Digging" her nails into the skin helps. She is starting to get it on the right as well. She also has facial pain in the trigeminal and supraorbital distribution. The headache is continuous with superimposed increased pain. She has burning in the occipital area on the left in the distribution of the lesser and greater occipital nerves that improved with cervical physical therapy. MRI and MRA of the brain and head were unremarkable. MRI of the cervical spine showed degenerative changes without any nerve root impingement. She reports congestion with the headaches. 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   Sarina Ill, MD  [facility] [address] [place], [place] [address]  Phone [phone] Fax [phone]  A total of 40 minutes was spent face-to-face with this patient. Over half this time was spent on counseling patient on the hemicrania continua diagnosis and different diagnostic and therapeutic options available.

## 2014-12-27 NOTE — Patient Instructions (Signed)
Remember to drink plenty of fluid, eat healthy meals and do not skip any meals. Try to eat protein with a every meal and eat a healthy snack such as fruit or nuts in between meals. Try to keep a regular sleep-wake schedule and try to exercise daily, particularly in the form of walking, 20-30 minutes a day, if you can.   As far as your medications are concerned, I would like to suggest: Indomethacin for Hemicrania Continue  As far as diagnostic testing: labwork  I would like to see you back as needed, sooner if we need to. Please call us with any interim questions, concerns, problems, updates or refill requests.   Please also call us for any test results so we can go over those with you on the phone.  My clinical assistant and will answer any of your questions and relay your messages to me and also relay most of my messages to you.   Our phone number is [phone]. We also have an after hours call service for urgent matters and there is a physician on-call for urgent questions. For any emergencies you know to call 911 or go to the nearest emergency room

## 2015-01-02 ENCOUNTER — Telehealth: Payer: Self-pay | Admitting: Neurology

## 2015-01-02 DIAGNOSIS — G4451 Hemicrania continua: Secondary | ICD-10-CM

## 2015-01-02 DIAGNOSIS — R51 Headache: Principal | ICD-10-CM

## 2015-01-02 DIAGNOSIS — R519 Headache, unspecified: Secondary | ICD-10-CM

## 2015-01-02 NOTE — Telephone Encounter (Signed)
Called pt back. She was getting thyroid rechecked. Talked at last OV about what labs to get while checking thyroid. Per Dr Jaynee Eagles, ESR and CRP were the two Dr Jaynee Eagles had advised her to also have checked. Pt verbalized understanding and said she will call back over to [facility] to let them know.

## 2015-01-02 NOTE — Telephone Encounter (Signed)
Patient called regarding labs to get done at [facility], Doctor at [facility] doesn't understand those labs, please call to give clarification.

## 2015-01-04 NOTE — Telephone Encounter (Signed)
Okay per Dr Jaynee Eagles to place lab orders for ESR and CRP. Placed for future.

## 2015-01-04 NOTE — Telephone Encounter (Addendum)
Pt called and would like to know what lab we use. She is requesting a call back [phone], may leave a message. She will need orders sent.

## 2015-01-04 NOTE — Addendum Note (Signed)
Addended by: Rossie Muskrat L on: 01/04/2015 10:16 AM   Modules accepted: Orders

## 2015-01-04 NOTE — Telephone Encounter (Signed)
Called pt back. She stated her PCP told her she will have to get labs done at our office. Advised we could fax orders to her office so she could get labs done all at same time. She stated she was told she cannot do this. She is going on 12/27 to PCP to get labs done and will stop by our office same day to do other labs Dr Jaynee Eagles mentioned. Gave her lab hours. Told her to call w/ any further questions.

## 2015-01-10 ENCOUNTER — Other Ambulatory Visit (INDEPENDENT_AMBULATORY_CARE_PROVIDER_SITE_OTHER): Payer: Self-pay

## 2015-01-10 DIAGNOSIS — R519 Headache, unspecified: Secondary | ICD-10-CM

## 2015-01-10 DIAGNOSIS — E785 Hyperlipidemia, unspecified: Secondary | ICD-10-CM | POA: Diagnosis not present

## 2015-01-10 DIAGNOSIS — Z0289 Encounter for other administrative examinations: Secondary | ICD-10-CM

## 2015-01-10 DIAGNOSIS — R51 Headache: Secondary | ICD-10-CM | POA: Diagnosis not present

## 2015-01-10 DIAGNOSIS — E039 Hypothyroidism, unspecified: Secondary | ICD-10-CM | POA: Diagnosis not present

## 2015-01-10 DIAGNOSIS — G4451 Hemicrania continua: Secondary | ICD-10-CM | POA: Diagnosis not present

## 2015-01-10 DIAGNOSIS — F418 Other specified anxiety disorders: Secondary | ICD-10-CM | POA: Diagnosis not present

## 2015-01-11 LAB — C-REACTIVE PROTEIN: CRP: 0.7 mg/L (ref 0.0–4.9)

## 2015-01-11 LAB — SEDIMENTATION RATE: Sed Rate: 2 mm/hr (ref 0–40)

## 2015-01-12 ENCOUNTER — Telehealth: Payer: Self-pay | Admitting: *Deleted

## 2015-01-12 NOTE — Telephone Encounter (Signed)
-----   Message from Melvenia Beam, MD sent at 01/11/2015  2:35 PM EST ----- Let patient know that labs are normal thank you!

## 2015-01-12 NOTE — Telephone Encounter (Signed)
Pt called, message relayed. Pt understood.

## 2015-01-12 NOTE — Telephone Encounter (Signed)
LVM for pt to call about results. Ok to inform pt labs normal per Dr Jaynee Eagles.

## 2015-01-25 ENCOUNTER — Ambulatory Visit (INDEPENDENT_AMBULATORY_CARE_PROVIDER_SITE_OTHER): Payer: Medicare Other | Admitting: Psychology

## 2015-01-25 DIAGNOSIS — F4310 Post-traumatic stress disorder, unspecified: Secondary | ICD-10-CM

## 2015-01-25 DIAGNOSIS — F411 Generalized anxiety disorder: Secondary | ICD-10-CM | POA: Diagnosis not present

## 2015-01-31 ENCOUNTER — Ambulatory Visit (INDEPENDENT_AMBULATORY_CARE_PROVIDER_SITE_OTHER): Payer: Medicare Other | Admitting: Neurology

## 2015-01-31 ENCOUNTER — Encounter: Payer: Self-pay | Admitting: Neurology

## 2015-01-31 VITALS — BP 141/77 | HR 86 | Ht 66.0 in | Wt 145.6 lb

## 2015-01-31 DIAGNOSIS — F919 Conduct disorder, unspecified: Secondary | ICD-10-CM | POA: Diagnosis not present

## 2015-01-31 DIAGNOSIS — R4189 Other symptoms and signs involving cognitive functions and awareness: Secondary | ICD-10-CM

## 2015-01-31 DIAGNOSIS — R4689 Other symptoms and signs involving appearance and behavior: Principal | ICD-10-CM

## 2015-01-31 DIAGNOSIS — E538 Deficiency of other specified B group vitamins: Secondary | ICD-10-CM | POA: Diagnosis not present

## 2015-01-31 NOTE — Progress Notes (Addendum)
[facility]    Provider: Dr Jaynee Eagles Referring Provider: Aretta Nip, MD Primary Care Physician: Milagros Evener, MD  CC: headache  Interval Update 01/30/2014; She is here for a new problem. She has memory issues. She can't remember names of people or places, reads word incorrectly, she has a hard time with people's names, she prefers to write as she needs time to think, worse with being nervous, she forgets what she is thinking about just walking from one room to another, she has a problem expressing herself, she is misplacing things, forgets things if she doesn't write them down  She types incorrectly and sometimes seem way slower moving mouse when playing game. She loses her train of thought. Slowly progressive. She has a hard time rmembering specifics from the past as well. She lives independently.    MRI HEAD FINDINGS 02/2014  No evidence for acute infarction, hemorrhage, mass lesion, hydrocephalus, or extra-axial fluid. Normal cerebral volume. Mild subcortical and periventricular T2 and FLAIR hyperintensities, likely chronic microvascular ischemic change. Pituitary, pineal, and cerebellar tonsils unremarkable. No upper cervical lesions. Flow voids are maintained throughout the carotid, basilar, and vertebral arteries. There are no areas of chronic hemorrhage. Chronically opacified RIGHT maxillary sinus. Suspect right-sided infundibular narrowing. No expansion to suggest mucocele. RIGHT mastoid fluid, likely effusion. No nasopharyngeal mass. Negative orbits. Negative calvarium and scalp soft tissues.  MRA HEAD FINDINGS 02/2014  The internal carotid arteries are widely patent. The basilar artery is widely patent with both vertebrals contributing, greater on the RIGHT. There is no flow-limiting stenosis or intracranial aneurysm.  IMPRESSION: No acute intracranial abnormality. Mild small vessel disease.  Chronic RIGHT maxillary sinusitis.  Incidental RIGHT  mastoid effusion of uncertain significance.  Negative MRA intracranial circulation.   Interval update 12/27/2014: She had 2 weird episodes. She was in the doctor's office and she had a strange sensation that her eye ball was being pulled down and out and she didn't lose vision but it wasn't registering, she could see and it only lasted instantly and felt drained. And the artery on the left temporal lobe gets bulbous and throbbing and painful. She has had normal crp/esr in the past, she declines that testing today. She has tenderness there in the temple area and a feeling of swishing warm feeling across her head and a stabbing pain in the inner ear. One time only. Went away. She gets congestion with her headaches. The occipital headaches were helped with physical therapy. 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 She is also having multiple other complaints and memory problems. We reviewed the MRi of her brain and cervical spine and reviewed the images together.  HPI: Bridget Mcdonald is a 70 y.o. female here as a referral from Dr. Radene Ou for headaches. She has a stabbing headache in the left fronto-parietal area. Digging her nails into the skin helps. She is starting to get it on the right as well. Today she reports that she also has facial pain in the face (points to the trigeminal and supraorbital distribution). She has burning in the occipital area on the left. She has done a lot of research, she does not understand why she has these symptoms. She has had trigeminal neuralgia in the past and these symptoms are different. No rhinorrhea, lacrimation or injection with these symptoms. She does not want to try medication.   MRi of the cervical spine: This is an abnormal MRI of the cervical spine showing multilevel degenerative changes as detailed  above. There are various combinations of uncovertebral spurring and disc bulging/protrusion at every cervical  level. There is no severe foraminal narrowing and there does not appear to be any nerve root compression.   Addendum: Labs collected November 2015 include hemoglobin A1c of 5.6, normal TSH,   Labs collected August 2015 include normal CBC with differential, normal CMP, sedimentation rate 6, B12 677, LDL 87   Initial visit 05/23/2014 Bridget Mcdonald is a 70 y.o. female here as a referral from Dr. Radene Ou for headaches. Past medical history of hypertension, high cholesterol, anxiety, depression. Started December 12th. She has a history of trigeminal neuralgia. She has tenderness in the left sided scalp. She has pressure in the head. She has lightning, severe, sharp in the left parietal and occipital areas on the left. Then turns into a pressure headache all over the scalp with soething sitting on on the left frontal/paritel lobe. Headaches are every other day, they last for 5-10 minutes or all day long. Also behind the left ear. She has dizziness. She feels a fluid wave in the brain. She has photopsia. She has bilat tinnitus but no hearing loss and has had hearing checked. She gets occipital headaches. Burning in the occipital area.  Also has numbness in the toes, worsening distally.   Reviewed notes, labs and imaging from outside physicians, which showed: Recently reviewed MRI of the brain and MRA of the head with patient including images. Some mild chronic microvascular ischemic changes nonspecific. Otherwise negative. MRA of the head without significant stenosis in the intracerebral arteries.    Social History   Social History  . Marital Status: Single    Spouse Name: N/A  . Number of Children: 1  . Years of Education: Post Grad   Occupational History  . Retired       Secretary/administrator   Social History Main Topics  . Smoking status: Current Every Day Smoker -- 1.50 packs/day for 50 years    Types: Cigarettes  . Smokeless tobacco: Never Used  . Alcohol Use: Yes     Comment: rare  . Drug Use:  No  . Sexual Activity: Not on file   Other Topics Concern  . Not on file   Social History Narrative   Lives at home with herself.   Caffeine use: 2 cups per day    Family History  Problem Relation Age of Onset  . Emphysema Mother   . Cancer Brother     prostate  . Cancer Father     prostate  . Heart disease Paternal Grandfather   . Bladder Cancer      Past Medical History  Diagnosis Date  . History of cardiac arrhythmia   . Hypertension   . High cholesterol   . Chronic headaches   . Allergy history unknown   . Hypothyroidism   . Palpitations 12/01/2012    Echocardiogram - 7/13-normal EF, trace valvular lesions, mildly AI  . Cancer ([place])     skin, breast (left)  . Barrett's esophagus     dx'd in [place]  . Acid reflux   . Asthma, mild intermittent   . Migraine headache   . PVC's (premature ventricular contractions)   . 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 Hyperlipidemia   . IBS (irritable bowel syndrome)   . Vestibular neuronitis     herpetic  . Osteopenia   . Cyst of left kidney     mult-locular Dr.Dahlstedt  . Hematuria  Dr. Zannie Cove  . History of colon polyps     04/2009 [place] unknown histology  . Incontinence, feces     09/2012  . Hypotonia     [name] 09/2012    Past Surgical History  Procedure Laterality Date  . Breast lumpectomy  2005?     left  . Tubal ligation    . Oophorectomy  1968?  Marland Kitchen Tonsilectomy, adenoidectomy, bilateral myringotomy and tubes  1961  . Appendectomy  1964  . Skin surgery  08/2012  . Colonoscopy  04/2009    Current Outpatient Prescriptions  Medication Sig Dispense Refill  . alendronate (FOSAMAX) 70 MG tablet Take 70 mg by mouth once a week. Take with a full glass of water on an empty stomach.    Marland Kitchen atorvastatin (LIPITOR) 10 MG tablet Take 10 mg by mouth daily.    Marland Kitchen diltiazem (CARDIZEM CD) 240 MG 24 hr capsule Take 1 capsule (240 mg total) by mouth daily. 90 capsule 3  . diltiazem (CARDIZEM) 60 MG  tablet Take 1 tablet (60 mg total) by mouth every 6 (six) hours as needed. 90 tablet 3  . hyoscyamine (NULEV) 0.125 MG TBDP disintergrating tablet Place 0.125 mg under the tongue every 4 (four) hours as needed.    Marland Kitchen levothyroxine (SYNTHROID, LEVOTHROID) 175 MCG tablet Take 175 mcg by mouth daily.  0  . loperamide (IMODIUM) 1 MG/5ML solution Take 2 mg by mouth as needed for diarrhea or loose stools.    . Multiple Vitamin (MULTIVITAMIN) tablet Take 1 tablet by mouth daily. Nature's way    . Omega-3 Fatty Acids (OMEGA-3 EPA FISH OIL PO) Take 1,400 mg by mouth daily.    . vitamin C (ASCORBIC ACID) 500 MG tablet Take 500 mg by mouth daily.    . indomethacin (INDOCIN) 25 MG capsule Start with 1 capsule three times a day. Can increase to 2 capsules three times a day. (Patient not taking: Reported on 01/31/2015) 180 capsule 11   No current facility-administered medications for this visit.    Allergies as of 01/31/2015 - Review Complete 01/31/2015  Allergen Reaction Noted  . Demerol [meperidine]  09/18/2011  . Epinephrine  09/18/2011  . Quinine derivatives  09/18/2011    Vitals: BP 141/77 mmHg  Pulse 86  Ht _0  (1.676 m)  Wt 145 lb 9.6 oz (66.044 kg)  BMI 23.51 kg/m2 Last Weight:  Wt Readings from Last 1 Encounters:  01/31/15 145 lb 9.6 oz (66.044 kg)   Last Height:   Ht Readings from Last 1 Encounters:  01/31/15 _1  (1.676 m)   MMSE - Mini Mental State Exam 01/31/2015  Orientation to time 5  Orientation to Place 5  Registration 3  Attention/ Calculation 5  Recall 3  Language- name 2 objects 2  Language- repeat 1  Language- follow 3 step command 3  Language- read & follow direction 1  Write a sentence 1  Copy design 1  Total score 30    Neuro: Detailed Neurologic Exam  Speech:  Speech is normal; fluent and spontaneous with normal comprehension.  Cognition:  The patient is oriented to person, place, and time;   recent and remote memory intact;   language  fluent;   normal attention, concentration,   fund of knowledge   Cranial Nerves:  The pupils are equal, round, and reactive to light. Visual fields are full to finger confrontation. Extraocular movements are intact. Trigeminal sensation is intact and the muscles of mastication are normal. The face is  symmetric. The palate elevates in the midline. Hearing intact. Voice is normal. Shoulder shrug is normal. The tongue has normal motion without fasciculations.   MMSE - Mini Mental State Exam 01/31/2015  Orientation to time 5  Orientation to Place 5  Registration 3  Attention/ Calculation 5  Recall 3  Language- name 2 objects 2  Language- repeat 1  Language- follow 3 step command 3  Language- read & follow direction 1  Write a sentence 1  Copy design 1  Total score 30     Assessment/Plan: 70 year old female with left-sided headaches since December. Neuro exam in non focal.She has a stabbing headache in the left fronto-parietal area. "Digging" her nails into the skin helps. She is starting to get it on the right as well. She also has facial pain in the trigeminal and supraorbital distribution. The headache is continuous with superimposed increased pain. She has burning in the occipital area on the left in the distribution of the lesser and greater occipital nerves that improved with cervical physical therapy. MRI and MRA of the brain and head were unremarkable. MRI of the cervical spine showed degenerative changes without any nerve root impingement. She reports congestion with the headaches. 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   She is having cognitive complaints. Likely multifactorial, normal cognitive aging, depression and anxiety. But she would really like neurocognitive testing. Agree and will order neurocognitive testing.   Sarina Ill, MD  [facility] [address] [place], [place] [address]  Phone [phone] Fax [phone]  A total of 30 minutes was spent face-to-face with this patient. 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

## 2015-01-31 NOTE — Patient Instructions (Signed)
Overall you are doing fairly well but I do want to suggest a few things today:   Remember to drink plenty of fluid, eat healthy meals and do not skip any meals. Try to eat protein with a every meal and eat a healthy snack such as fruit or nuts in between meals. Try to keep a regular sleep-wake schedule and try to exercise daily, particularly in the form of walking, 20-30 minutes a day, if you can.   As far as your medications are concerned, I would like to suggest: Suggest Aricept/donepezil  As far as diagnostic testing: Neurocognitive testing  I would like to see you back after neurocognitive testing, sooner if we need to. Please call us with any interim questions, concerns, problems, updates or refill requests.   Our phone number is [phone]. We also have an after hours call service for urgent matters and there is a physician on-call for urgent questions. For any emergencies you know to call 911 or go to the nearest emergency room

## 2015-02-08 ENCOUNTER — Ambulatory Visit (INDEPENDENT_AMBULATORY_CARE_PROVIDER_SITE_OTHER): Payer: Medicare Other | Admitting: Psychology

## 2015-02-08 DIAGNOSIS — F4310 Post-traumatic stress disorder, unspecified: Secondary | ICD-10-CM

## 2015-02-08 DIAGNOSIS — F411 Generalized anxiety disorder: Secondary | ICD-10-CM

## 2015-02-22 ENCOUNTER — Ambulatory Visit: Payer: Medicare Other | Admitting: Psychology

## 2015-03-15 DIAGNOSIS — Z86010 Personal history of colonic polyps: Secondary | ICD-10-CM | POA: Diagnosis not present

## 2015-03-15 DIAGNOSIS — R109 Unspecified abdominal pain: Secondary | ICD-10-CM | POA: Diagnosis not present

## 2015-03-15 DIAGNOSIS — K625 Hemorrhage of anus and rectum: Secondary | ICD-10-CM | POA: Diagnosis not present

## 2015-03-20 ENCOUNTER — Ambulatory Visit (INDEPENDENT_AMBULATORY_CARE_PROVIDER_SITE_OTHER): Payer: Medicare Other | Admitting: Psychology

## 2015-03-20 DIAGNOSIS — F4310 Post-traumatic stress disorder, unspecified: Secondary | ICD-10-CM

## 2015-04-03 ENCOUNTER — Ambulatory Visit (INDEPENDENT_AMBULATORY_CARE_PROVIDER_SITE_OTHER): Payer: Medicare Other | Admitting: Psychology

## 2015-04-03 DIAGNOSIS — F4310 Post-traumatic stress disorder, unspecified: Secondary | ICD-10-CM

## 2015-04-19 ENCOUNTER — Ambulatory Visit (INDEPENDENT_AMBULATORY_CARE_PROVIDER_SITE_OTHER): Payer: Medicare Other | Admitting: Psychology

## 2015-04-19 DIAGNOSIS — F4310 Post-traumatic stress disorder, unspecified: Secondary | ICD-10-CM

## 2015-04-19 DIAGNOSIS — F411 Generalized anxiety disorder: Secondary | ICD-10-CM

## 2015-04-21 ENCOUNTER — Other Ambulatory Visit: Payer: Self-pay | Admitting: Gastroenterology

## 2015-04-21 DIAGNOSIS — D120 Benign neoplasm of cecum: Secondary | ICD-10-CM | POA: Diagnosis not present

## 2015-04-21 DIAGNOSIS — D125 Benign neoplasm of sigmoid colon: Secondary | ICD-10-CM | POA: Diagnosis not present

## 2015-04-21 DIAGNOSIS — D126 Benign neoplasm of colon, unspecified: Secondary | ICD-10-CM | POA: Diagnosis not present

## 2015-04-21 DIAGNOSIS — D122 Benign neoplasm of ascending colon: Secondary | ICD-10-CM | POA: Diagnosis not present

## 2015-04-21 DIAGNOSIS — D127 Benign neoplasm of rectosigmoid junction: Secondary | ICD-10-CM | POA: Diagnosis not present

## 2015-04-21 DIAGNOSIS — K625 Hemorrhage of anus and rectum: Secondary | ICD-10-CM | POA: Diagnosis not present

## 2015-04-21 DIAGNOSIS — D123 Benign neoplasm of transverse colon: Secondary | ICD-10-CM | POA: Diagnosis not present

## 2015-04-25 ENCOUNTER — Emergency Department (HOSPITAL_COMMUNITY): Payer: Medicare Other

## 2015-04-25 ENCOUNTER — Emergency Department (HOSPITAL_COMMUNITY)
Admission: EM | Admit: 2015-04-25 | Discharge: 2015-04-25 | Disposition: A | Discharge status: Home / Self Care | Payer: Medicare Other | Attending: Emergency Medicine | Admitting: Emergency Medicine

## 2015-04-25 ENCOUNTER — Encounter (HOSPITAL_COMMUNITY): Payer: Self-pay | Admitting: Emergency Medicine

## 2015-04-25 DIAGNOSIS — E7800 Pure hypercholesterolemia, unspecified: Secondary | ICD-10-CM | POA: Insufficient documentation

## 2015-04-25 DIAGNOSIS — Z853 Personal history of malignant neoplasm of breast: Secondary | ICD-10-CM | POA: Insufficient documentation

## 2015-04-25 DIAGNOSIS — Z8739 Personal history of other diseases of the musculoskeletal system and connective tissue: Secondary | ICD-10-CM | POA: Insufficient documentation

## 2015-04-25 DIAGNOSIS — R109 Unspecified abdominal pain: Secondary | ICD-10-CM

## 2015-04-25 DIAGNOSIS — Z8719 Personal history of other diseases of the digestive system: Secondary | ICD-10-CM | POA: Diagnosis not present

## 2015-04-25 DIAGNOSIS — Y9389 Activity, other specified: Secondary | ICD-10-CM | POA: Diagnosis not present

## 2015-04-25 DIAGNOSIS — X58XXXA Exposure to other specified factors, initial encounter: Secondary | ICD-10-CM | POA: Diagnosis not present

## 2015-04-25 DIAGNOSIS — F17210 Nicotine dependence, cigarettes, uncomplicated: Secondary | ICD-10-CM | POA: Diagnosis not present

## 2015-04-25 DIAGNOSIS — E039 Hypothyroidism, unspecified: Secondary | ICD-10-CM | POA: Diagnosis not present

## 2015-04-25 DIAGNOSIS — Z9889 Other specified postprocedural states: Secondary | ICD-10-CM | POA: Insufficient documentation

## 2015-04-25 DIAGNOSIS — Y9289 Other specified places as the place of occurrence of the external cause: Secondary | ICD-10-CM | POA: Insufficient documentation

## 2015-04-25 DIAGNOSIS — S36039A Unspecified laceration of spleen, initial encounter: Secondary | ICD-10-CM | POA: Insufficient documentation

## 2015-04-25 DIAGNOSIS — Z86010 Personal history of colonic polyps: Secondary | ICD-10-CM | POA: Insufficient documentation

## 2015-04-25 DIAGNOSIS — Y998 Other external cause status: Secondary | ICD-10-CM | POA: Insufficient documentation

## 2015-04-25 DIAGNOSIS — J4520 Mild intermittent asthma, uncomplicated: Secondary | ICD-10-CM | POA: Insufficient documentation

## 2015-04-25 DIAGNOSIS — Z85828 Personal history of other malignant neoplasm of skin: Secondary | ICD-10-CM | POA: Diagnosis not present

## 2015-04-25 DIAGNOSIS — Z87448 Personal history of other diseases of urinary system: Secondary | ICD-10-CM | POA: Insufficient documentation

## 2015-04-25 DIAGNOSIS — I10 Essential (primary) hypertension: Secondary | ICD-10-CM | POA: Diagnosis not present

## 2015-04-25 DIAGNOSIS — Z79899 Other long term (current) drug therapy: Secondary | ICD-10-CM | POA: Diagnosis not present

## 2015-04-25 DIAGNOSIS — G8929 Other chronic pain: Secondary | ICD-10-CM | POA: Insufficient documentation

## 2015-04-25 DIAGNOSIS — S36030A Superficial (capsular) laceration of spleen, initial encounter: Secondary | ICD-10-CM | POA: Diagnosis not present

## 2015-04-25 DIAGNOSIS — E785 Hyperlipidemia, unspecified: Secondary | ICD-10-CM | POA: Insufficient documentation

## 2015-04-25 DIAGNOSIS — R1084 Generalized abdominal pain: Secondary | ICD-10-CM | POA: Diagnosis not present

## 2015-04-25 LAB — COMPREHENSIVE METABOLIC PANEL
ALT: 15 U/L (ref 14–54)
AST: 15 U/L (ref 15–41)
Albumin: 3.7 g/dL (ref 3.5–5.0)
Alkaline Phosphatase: 69 U/L (ref 38–126)
Anion gap: 7 (ref 5–15)
BUN: 10 mg/dL (ref 6–20)
CO2: 27 mmol/L (ref 22–32)
Calcium: 8.6 mg/dL — ABNORMAL LOW (ref 8.9–10.3)
Chloride: 104 mmol/L (ref 101–111)
Creatinine, Ser: 0.73 mg/dL (ref 0.44–1.00)
GFR calc Af Amer: >60 mL/min (ref >60)
GFR calc non Af Amer: >60 mL/min (ref >60)
Glucose, Bld: 129 mg/dL — ABNORMAL HIGH (ref 65–99)
POTASSIUM: 3.6 mmol/L (ref 3.5–5.1)
Sodium: 138 mmol/L (ref 135–145)
Total Bilirubin: 0.6 mg/dL (ref 0.3–1.2)
Total Protein: 6.8 g/dL (ref 6.5–8.1)

## 2015-04-25 LAB — CBC
HEMATOCRIT: 32.6 % — AB (ref 36.0–46.0)
Hemoglobin: 11.6 g/dL — ABNORMAL LOW (ref 12.0–15.0)
MCH: 33.1 pg (ref 26.0–34.0)
MCHC: 35.6 g/dL (ref 30.0–36.0)
MCV: 93.1 fL (ref 78.0–100.0)
Platelets: 296 10*3/uL (ref 150–400)
RBC: 3.5 MIL/uL — ABNORMAL LOW (ref 3.87–5.11)
RDW: 12.4 % (ref 11.5–15.5)
WBC: 11.4 10*3/uL — ABNORMAL HIGH (ref 4.0–10.5)

## 2015-04-25 LAB — URINE MICROSCOPIC-ADD ON

## 2015-04-25 LAB — URINALYSIS, ROUTINE W REFLEX MICROSCOPIC
GLUCOSE, UA: NEGATIVE mg/dL
Ketones, ur: NEGATIVE mg/dL
Nitrite: NEGATIVE
PH: 7 (ref 5.0–8.0)
Protein, ur: 30 mg/dL — AB
Specific Gravity, Urine: 1.023 (ref 1.005–1.030)

## 2015-04-25 LAB — LIPASE, BLOOD: LIPASE: 19 U/L (ref 11–51)

## 2015-04-25 MED ORDER — IOHEXOL 300 MG/ML  SOLN
25.0000 mL | Freq: Once | INTRAMUSCULAR | Status: AC | PRN
  Administered 2015-04-25: 25 mL via ORAL

## 2015-04-25 MED ORDER — SODIUM CHLORIDE 0.9 % IV BOLUS (SEPSIS)
500.0000 mL | Freq: Once | INTRAVENOUS | Status: AC
  Administered 2015-04-25: 500 mL via INTRAVENOUS

## 2015-04-25 MED ORDER — IOPAMIDOL (ISOVUE-300) INJECTION 61%
100.0000 mL | Freq: Once | INTRAVENOUS | Status: AC | PRN
  Administered 2015-04-25: 100 mL via INTRAVENOUS

## 2015-04-25 NOTE — ED Notes (Addendum)
Surgeon at bedside.  

## 2015-04-25 NOTE — Consult Note (Signed)
Reason for Consult:Splenic injury  Referring Physician: Nat Christen EDP  Bridget Mcdonald is an 70 y.o. female.  HPI: She is a 70 year old female who 4 days ago underwen ta colonoscopy and polypectomy.She had what she describes as immediate abdominal pain following the procedure which was somewhat generalized but located more in the upper abdomen.Since being at home she felt this likely was gas and is take laxatives and has passed gas and had bowel movements but no improvement in her pain. The pain has become more localized to the left upper quadrant.Worse with deep inspiration. This radiates up into the left shoulder. She had nausea and vomiting the first day after her colonoscopy but none since. Due to persistent pain she called the GI office today and was advised to come to the ED for evaluation and likely CT scan.  No lightheadedness or syncope.  She is not on any anticoagulant medication.  Past Medical History  Diagnosis Date  . History of cardiac arrhythmia   . Hypertension   . High cholesterol   . Chronic headaches   . Allergy history unknown   . Hypothyroidism   . Palpitations 12/01/2012    Echocardiogram - 7/13-normal EF, trace valvular lesions, mildly AI  . Cancer ([place])     skin, breast (left)  . Barrett's esophagus     dx'd in [place]09  . Acid reflux   . Asthma, mild intermittent   . Migraine headache   . PVC's (premature ventricular contractions)   . 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 Hyperlipidemia   . IBS (irritable bowel syndrome)   . Vestibular neuronitis     herpetic  . Osteopenia   . Cyst of left kidney     mult-locular Dr.Dahlstedt  . Hematuria     Dr. Zannie Cove  . History of colon polyps     04/2009 [place] unknown histology  . Incontinence, feces     09/2012  . Hypotonia     [name] 09/2012    Past Surgical History  Procedure Laterality Date  . Breast lumpectomy  2005?     left  . Tubal ligation    . Oophorectomy  1968?  Marland Kitchen  Tonsilectomy, adenoidectomy, bilateral myringotomy and tubes  1961  . Appendectomy  1964  . Skin surgery  08/2012  . Colonoscopy  04/2009    Family History  Problem Relation Age of Onset  . Emphysema Mother   . Cancer Brother     prostate  . Cancer Father     prostate  . Heart disease Paternal Grandfather   . Bladder Cancer      Social History:  reports that she has been smoking Cigarettes.  She has a 75 pack-year smoking history. She has never used smokeless tobacco. She reports that she drinks alcohol. She reports that she does not use illicit drugs.  Allergies:  Allergies  Allergen Reactions  . Demerol [Meperidine]     hallucinations  . Epinephrine     Super hyper  . Gabapentin Diarrhea  . Quinine Derivatives     deaf    No current facility-administered medications for this encounter.   Current Outpatient Prescriptions  Medication Sig Dispense Refill  . alendronate (FOSAMAX) 70 MG tablet Take 70 mg by mouth once a week. Take with a full glass of water on an empty stomach.    Marland Kitchen atorvastatin (LIPITOR) 10 MG tablet Take 10 mg by mouth daily.    Marland Kitchen CALCIUM-VITAMIN D PO Take 1  tablet by mouth daily.    Marland Kitchen diltiazem (CARDIZEM CD) 240 MG 24 hr capsule Take 1 capsule (240 mg total) by mouth daily. 90 capsule 3  . diltiazem (CARDIZEM) 60 MG tablet Take 1 tablet (60 mg total) by mouth every 6 (six) hours as needed. 90 tablet 3  . hyoscyamine (NULEV) 0.125 MG TBDP disintergrating tablet Place 0.125 mg under the tongue every 4 (four) hours as needed for bladder spasms.     Marland Kitchen levothyroxine (SYNTHROID, LEVOTHROID) 175 MCG tablet Take 175 mcg by mouth daily.  0  . Multiple Vitamin (MULTIVITAMIN) tablet Take 1 tablet by mouth daily. Nature's way    . vitamin C (ASCORBIC ACID) 500 MG tablet Take 500 mg by mouth daily.    . indomethacin (INDOCIN) 25 MG capsule Start with 1 capsule three times a day. Can increase to 2 capsules three times a day. (Patient not taking: Reported on 01/31/2015) 180  capsule 11     Results for orders placed or performed during the hospital encounter of 04/25/15 (from the past 48 hour(s))  Lipase, blood     Status: None   Collection Time: 04/25/15  2:12 PM  Result Value Ref Range   Lipase 19 11 - 51 U/L  Comprehensive metabolic panel     Status: Abnormal   Collection Time: 04/25/15  2:12 PM  Result Value Ref Range   Sodium 138 135 - 145 mmol/L   Potassium 3.6 3.5 - 5.1 mmol/L   Chloride 104 101 - 111 mmol/L   CO2 27 22 - 32 mmol/L   Glucose, Bld 129 (H) 65 - 99 mg/dL   BUN 10 6 - 20 mg/dL   Creatinine, Ser 0.73 0.44 - 1.00 mg/dL   Calcium 8.6 (L) 8.9 - 10.3 mg/dL   Total Protein 6.8 6.5 - 8.1 g/dL   Albumin 3.7 3.5 - 5.0 g/dL   AST 15 15 - 41 U/L   ALT 15 14 - 54 U/L   Alkaline Phosphatase 69 38 - 126 U/L   Total Bilirubin 0.6 0.3 - 1.2 mg/dL   GFR calc non Af Amer >60 >60 mL/min   GFR calc Af Amer >60 >60 mL/min    Comment: (NOTE) The eGFR has been calculated using the CKD EPI equation. This calculation has not been validated in all clinical situations. eGFR's persistently <60 mL/min signify possible Chronic Kidney Disease.    Anion gap 7 5 - 15  CBC     Status: Abnormal   Collection Time: 04/25/15  2:12 PM  Result Value Ref Range   WBC 11.4 (H) 4.0 - 10.5 K/uL   RBC 3.50 (L) 3.87 - 5.11 MIL/uL   Hemoglobin 11.6 (L) 12.0 - 15.0 g/dL   HCT 32.6 (L) 36.0 - 46.0 %   MCV 93.1 78.0 - 100.0 fL   MCH 33.1 26.0 - 34.0 pg   MCHC 35.6 30.0 - 36.0 g/dL   RDW 12.4 11.5 - 15.5 %   Platelets 296 150 - 400 K/uL  Urinalysis, Routine w reflex microscopic (not at [facility])     Status: Abnormal   Collection Time: 04/25/15  3:49 PM  Result Value Ref Range   Color, Urine AMBER (A) YELLOW    Comment: BIOCHEMICALS MAY BE AFFECTED BY COLOR   APPearance CLOUDY (A) CLEAR   Specific Gravity, Urine 1.023 1.005 - 1.030   pH 7.0 5.0 - 8.0   Glucose, UA NEGATIVE NEGATIVE mg/dL   Hgb urine dipstick MODERATE (A) NEGATIVE   Bilirubin Urine  SMALL (A) NEGATIVE    Ketones, ur NEGATIVE NEGATIVE mg/dL   Protein, ur 30 (A) NEGATIVE mg/dL   Nitrite NEGATIVE NEGATIVE   Leukocytes, UA SMALL (A) NEGATIVE  Urine microscopic-add on     Status: Abnormal   Collection Time: 04/25/15  3:49 PM  Result Value Ref Range   Squamous Epithelial / LPF 0-5 (A) NONE SEEN   WBC, UA 0-5 0 - 5 WBC/hpf   RBC / HPF TOO NUMEROUS TO COUNT 0 - 5 RBC/hpf   Bacteria, UA RARE (A) NONE SEEN   Urine-Other MUCOUS PRESENT     Ct Abdomen Pelvis W Contrast  04/25/2015  CLINICAL DATA:  Abdominal pain following endoscopy EXAM: CT ABDOMEN AND PELVIS WITH CONTRAST TECHNIQUE: Multidetector CT imaging of the abdomen and pelvis was performed using the standard protocol following bolus administration of intravenous contrast. CONTRAST:  9m OMNIPAQUE IOHEXOL 300 MG/ML SOLN, 1046mISOVUE-300 IOPAMIDOL (ISOVUE-300) INJECTION 61% COMPARISON:  09/15/2009 FINDINGS: Lung bases demonstrate a small left-sided pleural effusion and left lower lobe atelectasis. The liver, gallbladder, adrenal glands and pancreas are within normal limits. The right kidney is within normal limits. A large cystic lesion is noted in the upper pole of the left kidney stable from a prior ultrasound examination. It measures approximately 8 cm in greatest dimension. The spleen demonstrates evidence of a subcapsular hematoma with some mottled increased density consistent with more recent hemorrhage. Additionally a small perisplenic component of hematoma is noted as well. The hematoma measures at least 6.8 x 5.3 cm in transverse and craniocaudad projection. Mass-effect upon the adjacent spleen is seen. Hypodensity is noted within the substance of the spleen consistent with a laceration. No splenic infarct is noted. The appendix is not visualized consistent with a prior surgical history. Dense free fluid is noted within the pelvis consistent with blood related to the splenic injury. The bladder is well distended. No pelvic mass lesion is seen.  The bony structures are within normal limits. IMPRESSION: Changes consistent with splenic laceration with both subcapsular and perisplenic hematoma as described. Free fluid is noted within the pelvis consistent with blood. No definitive active extravasation is identified. Stable left renal cystic lesion Small left-sided pleural effusion and compensatory atelectasis. Critical Value/emergent results were called by telephone at the time of interpretation on 04/25/2015 at 6:46 pm to Dr. BRNat Christen who verbally acknowledged these results. Electronically Signed   By: MaInez Catalina.D.   On: 04/25/2015 18:50    Review of Systems  Constitutional: Negative for fever and chills.  Respiratory: Negative for shortness of breath.   Cardiovascular: Negative for chest pain and palpitations.  Gastrointestinal: Positive for nausea, vomiting and abdominal pain. Negative for diarrhea, constipation and blood in stool.   Blood pressure 130/58, pulse 86, temperature 98.9 F (37.2 C), temperature source Oral, resp. rate 18, SpO2 92 %. Physical Exam General: Alert, well-developed Caucasian female, in no distress Skin: Warm and dry without rash or infection. HEENT: No palpable masses or thyromegaly. Sclera nonicteric. Pupils equal round and reactive.Lymph nodes: No cervical, supraclavicular, or inguinal nodes palpable. Lungs: Breath sounds clear and equal without increased work of breathing Cardiovascular: Regular rate and rhythm without murmur. No JVD or edema.  Abdomen: Nondistended. Soft with localized tenderness in the left upper quadrant and epigastrium and left mid abdomen but no guarding.  No peritoneal signs. No masses palpable. No organomegaly. No palpable hernias. Extremities: No edema or joint swelling or deformity. No chronic venous stasis changes. Neurologic: Alert and fully oriented. Affect  normal.No focal weakness.   Assessment/Plan: Splenic injury postcolonoscopy with grade 1 laceration and perisplenic  hematoma.She appears very stable. Probably some moderate drop in hemoglobin.Vital signs all very stable. I discussed the diagnosis with the patient and recommended at least a short period of observation in the hospital with serial hemoglobins.. However  She is adamant that she is going home even AGAINST MEDICAL ADVICE as she has an elderly cat at home and no one to care for him.I think she is unlikely to have major bleeding or require surgery as we are already over 4 days out.  As she will not stay in the hospital arrangements are being made to reevaluate tomorrow with a repeat hemoglobin  In the emergency department.  She is encouraged to return immediately for any increasing abdominal pain, lightheadedness or any other symptoms of concern.  Daris Harkins T 04/25/2015, 8:36 PM

## 2015-04-25 NOTE — ED Notes (Signed)
Per pt, states had endoscopy on Friday-states pain since having procedure-states gas pains in upper and lower abdomin

## 2015-04-25 NOTE — ED Provider Notes (Addendum)
CSN: [id number]     Arrival date & time 04/25/15  1348 History   First MD Initiated Contact with Patient 04/25/15 1532     Chief Complaint  Patient presents with  . Abdominal Pain     (Consider location/radiation/quality/duration/timing/severity/associated sxs/prior Treatment) HPI.Marland KitchenMarland KitchenMarland KitchenLevel 5 caveat for urgent need for intervention.  Status post colonoscopy this past Friday by local gastroenterologist. Now with severe generalized abdominal pain with radiation to left shoulder. She has been eating. No fever, sweats, chills. Severity of pain is moderate to severe.  Past Medical History  Diagnosis Date  . History of cardiac arrhythmia   . Hypertension   . High cholesterol   . Chronic headaches   . Allergy history unknown   . Hypothyroidism   . Palpitations 12/01/2012    Echocardiogram - 7/13-normal EF, trace valvular lesions, mildly AI  . Cancer ([place])     skin, breast (left)  . Barrett's esophagus     [place]  . Acid reflux   . Asthma, mild intermittent   . Migraine headache   . PVC's (premature ventricular contractions)   . 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 Hyperlipidemia   . IBS (irritable bowel syndrome)   . Vestibular neuronitis     herpetic  . Osteopenia   . Cyst of left kidney     mult-locular Dr.Dahlstedt  . Hematuria     Dr. Zannie Cove  . History of colon polyps     04/2009 [place] unknown histology  . Incontinence, feces     09/2012  . Hypotonia     [name] 09/2012   Past Surgical History  Procedure Laterality Date  . Breast lumpectomy  2005?     left  . Tubal ligation    . Oophorectomy  1968?  Marland Kitchen Tonsilectomy, adenoidectomy, bilateral myringotomy and tubes  1961  . Appendectomy  1964  . Skin surgery  08/2012  . Colonoscopy  04/2009   Family History  Problem Relation Age of Onset  . Emphysema Mother   . Cancer Brother     prostate  . Cancer Father     prostate  . Heart disease Paternal Grandfather   . Bladder Cancer      Social History  Substance Use Topics  . Smoking status: Current Every Day Smoker -- 1.50 packs/day for 50 years    Types: Cigarettes  . Smokeless tobacco: Never Used  . Alcohol Use: Yes     Comment: rare   OB History    No data available     Review of Systems  Reason unable to perform ROS: Urgent need for intervention.      Allergies  Demerol; Epinephrine; Gabapentin; and Quinine derivatives  Home Medications   Prior to Admission medications   Medication Sig Start Date End Date Taking? Authorizing Provider  alendronate (FOSAMAX) 70 MG tablet Take 70 mg by mouth once a week. Take with a full glass of water on an empty stomach.   Yes Historical Provider, MD  atorvastatin (LIPITOR) 10 MG tablet Take 10 mg by mouth daily.   Yes Historical Provider, MD  CALCIUM-VITAMIN D PO Take 1 tablet by mouth daily.   Yes Historical Provider, MD  diltiazem (CARDIZEM CD) 240 MG 24 hr capsule Take 1 capsule (240 mg total) by mouth daily. 12/13/14  Yes Jerline Pain, MD  diltiazem (CARDIZEM) 60 MG tablet Take 1 tablet (60 mg total) by mouth every 6 (six) hours as needed. 12/13/14  Yes  Jerline Pain, MD  hyoscyamine (NULEV) 0.125 MG TBDP disintergrating tablet Place 0.125 mg under the tongue every 4 (four) hours as needed for bladder spasms.    Yes Historical Provider, MD  levothyroxine (SYNTHROID, LEVOTHROID) 175 MCG tablet Take 175 mcg by mouth daily. 09/08/13  Yes Historical Provider, MD  Multiple Vitamin (MULTIVITAMIN) tablet Take 1 tablet by mouth daily. Nature's way   Yes Historical Provider, MD  vitamin C (ASCORBIC ACID) 500 MG tablet Take 500 mg by mouth daily.   Yes Historical Provider, MD  indomethacin (INDOCIN) 25 MG capsule Start with 1 capsule three times a day. Can increase to 2 capsules three times a day. Patient not taking: Reported on 01/31/2015 12/27/14   Melvenia Beam, MD   BP 130/58 mmHg  Pulse 86  Temp(Src) 98.9 F (37.2 C) (Oral)  Resp 18  SpO2 92% Physical Exam   Constitutional: She is oriented to person, place, and time. She appears well-developed and well-nourished.  HENT:  Head: Normocephalic and atraumatic.  Eyes: Conjunctivae and EOM are normal. Pupils are equal, round, and reactive to light.  Neck: Normal range of motion. Neck supple.  Cardiovascular: Normal rate and regular rhythm.   Pulmonary/Chest: Effort normal and breath sounds normal.  Abdominal: Soft. Bowel sounds are normal.  Generalized lower abdominal tenderness  Musculoskeletal: Normal range of motion.  Neurological: She is alert and oriented to person, place, and time.  Skin: Skin is warm and dry.  Psychiatric: She has a normal mood and affect. Her behavior is normal.  Nursing note and vitals reviewed.   ED Course  Procedures (including critical care time) Labs Review Labs Reviewed  COMPREHENSIVE METABOLIC PANEL - Abnormal; Notable for the following:    Glucose, Bld 129 (*)    Calcium 8.6 (*)    All other components within normal limits  CBC - Abnormal; Notable for the following:    WBC 11.4 (*)    RBC 3.50 (*)    Hemoglobin 11.6 (*)    HCT 32.6 (*)    All other components within normal limits  URINALYSIS, ROUTINE W REFLEX MICROSCOPIC (NOT AT [facility]) - Abnormal; Notable for the following:    Color, Urine AMBER (*)    APPearance CLOUDY (*)    Hgb urine dipstick MODERATE (*)    Bilirubin Urine SMALL (*)    Protein, ur 30 (*)    Leukocytes, UA SMALL (*)    All other components within normal limits  URINE MICROSCOPIC-ADD ON - Abnormal; Notable for the following:    Squamous Epithelial / LPF 0-5 (*)    Bacteria, UA RARE (*)    All other components within normal limits  LIPASE, BLOOD    Imaging Review Ct Abdomen Pelvis W Contrast  04/25/2015  CLINICAL DATA:  Abdominal pain following endoscopy EXAM: CT ABDOMEN AND PELVIS WITH CONTRAST TECHNIQUE: Multidetector CT imaging of the abdomen and pelvis was performed using the standard protocol following bolus administration  of intravenous contrast. CONTRAST:  16mL OMNIPAQUE IOHEXOL 300 MG/ML SOLN, 160mL ISOVUE-300 IOPAMIDOL (ISOVUE-300) INJECTION 61% COMPARISON:  09/15/2009 FINDINGS: Lung bases demonstrate a small left-sided pleural effusion and left lower lobe atelectasis. The liver, gallbladder, adrenal glands and pancreas are within normal limits. The right kidney is within normal limits. A large cystic lesion is noted in the upper pole of the left kidney stable from a prior ultrasound examination. It measures approximately 8 cm in greatest dimension. The spleen demonstrates evidence of a subcapsular hematoma with some mottled increased density consistent  with more recent hemorrhage. Additionally a small perisplenic component of hematoma is noted as well. The hematoma measures at least 6.8 x 5.3 cm in transverse and craniocaudad projection. Mass-effect upon the adjacent spleen is seen. Hypodensity is noted within the substance of the spleen consistent with a laceration. No splenic infarct is noted. The appendix is not visualized consistent with a prior surgical history. Dense free fluid is noted within the pelvis consistent with blood related to the splenic injury. The bladder is well distended. No pelvic mass lesion is seen. The bony structures are within normal limits. IMPRESSION: Changes consistent with splenic laceration with both subcapsular and perisplenic hematoma as described. Free fluid is noted within the pelvis consistent with blood. No definitive active extravasation is identified. Stable left renal cystic lesion Small left-sided pleural effusion and compensatory atelectasis. Critical Value/emergent results were called by telephone at the time of interpretation on 04/25/2015 at 6:46 pm to Dr. Nat Christen , who verbally acknowledged these results. Electronically Signed   By: Inez Catalina M.D.   On: 04/25/2015 18:50   I have personally reviewed and evaluated these images and lab results as part of my medical  decision-making.   EKG Interpretation None     CRITICAL CARE Performed by: Nat Christen Total critical care time: 30 minutes Critical care time was exclusive of separately billable procedures and treating other patients. Critical care was necessary to treat or prevent imminent or life-threatening deterioration. Critical care was time spent personally by me on the following activities: development of treatment plan with patient and/or surrogate as well as nursing, discussions with consultants, evaluation of patient's response to treatment, examination of patient, obtaining history from patient or surrogate, ordering and performing treatments and interventions, ordering and review of laboratory studies, ordering and review of radiographic studies, pulse oximetry and re-evaluation of patient's condition. MDM   Final diagnoses:  Abdominal pain, unspecified abdominal location  Splenic laceration, initial encounter    Vital signs are stable. CT  scan of abdomen/pelvis is consistent with a splenic laceration.  Free fluid is noted within the pelvis consistent with blood. Will consult general surgery and gastroenterology. Patient was rechecked multiple times. Update at 2045:  I strongly encouraged patient to be admitted to the hospital for observation. She has decided to not be admitted.  She understands the consequences including worsening bleeding and possibly death. Patient will return in the morning for a recheck of her hemoglobin and a physical exam.   Nat Christen, MD 04/25/15 1859  Nat Christen, MD 04/25/15 2048

## 2015-04-25 NOTE — Discharge Instructions (Signed)
Return here early tomorrow morning for a repeat evaluation including a recheck of your hemoglobin and a physical exam. You may return any time in the middle the night if you get concerned

## 2015-04-25 NOTE — Progress Notes (Signed)
Pt refuses to answer questions on nursing admission hx. She states "I hope surgeon gets here soon or I am bailing". Lucius Conn BSN, RN-BC Admissions RN 04/25/2015 7:45 PM

## 2015-04-25 NOTE — ED Notes (Signed)
Patient was alert, oriented and stable upon discharge. RN went over AVS and patient had no further questions.  

## 2015-04-25 NOTE — ED Notes (Signed)
Patient transported to CT 

## 2015-04-26 ENCOUNTER — Encounter (HOSPITAL_COMMUNITY): Payer: Self-pay | Admitting: Emergency Medicine

## 2015-04-26 ENCOUNTER — Emergency Department (HOSPITAL_COMMUNITY)
Admission: EM | Admit: 2015-04-26 | Discharge: 2015-04-26 | Disposition: A | Discharge status: Home / Self Care | Payer: Medicare Other | Attending: Emergency Medicine | Admitting: Emergency Medicine

## 2015-04-26 DIAGNOSIS — E785 Hyperlipidemia, unspecified: Secondary | ICD-10-CM | POA: Diagnosis not present

## 2015-04-26 DIAGNOSIS — Y9289 Other specified places as the place of occurrence of the external cause: Secondary | ICD-10-CM | POA: Insufficient documentation

## 2015-04-26 DIAGNOSIS — Z853 Personal history of malignant neoplasm of breast: Secondary | ICD-10-CM | POA: Diagnosis not present

## 2015-04-26 DIAGNOSIS — Z8739 Personal history of other diseases of the musculoskeletal system and connective tissue: Secondary | ICD-10-CM | POA: Diagnosis not present

## 2015-04-26 DIAGNOSIS — J4520 Mild intermittent asthma, uncomplicated: Secondary | ICD-10-CM | POA: Diagnosis not present

## 2015-04-26 DIAGNOSIS — Z8719 Personal history of other diseases of the digestive system: Secondary | ICD-10-CM | POA: Diagnosis not present

## 2015-04-26 DIAGNOSIS — Z86010 Personal history of colonic polyps: Secondary | ICD-10-CM | POA: Insufficient documentation

## 2015-04-26 DIAGNOSIS — Z79899 Other long term (current) drug therapy: Secondary | ICD-10-CM | POA: Diagnosis not present

## 2015-04-26 DIAGNOSIS — E7800 Pure hypercholesterolemia, unspecified: Secondary | ICD-10-CM | POA: Insufficient documentation

## 2015-04-26 DIAGNOSIS — I10 Essential (primary) hypertension: Secondary | ICD-10-CM | POA: Insufficient documentation

## 2015-04-26 DIAGNOSIS — Z85828 Personal history of other malignant neoplasm of skin: Secondary | ICD-10-CM | POA: Insufficient documentation

## 2015-04-26 DIAGNOSIS — R1012 Left upper quadrant pain: Secondary | ICD-10-CM

## 2015-04-26 DIAGNOSIS — Q6100 Congenital renal cyst, unspecified: Secondary | ICD-10-CM | POA: Insufficient documentation

## 2015-04-26 DIAGNOSIS — S35299A Unspecified injury of branches of celiac and mesenteric artery, initial encounter: Secondary | ICD-10-CM | POA: Diagnosis not present

## 2015-04-26 DIAGNOSIS — E039 Hypothyroidism, unspecified: Secondary | ICD-10-CM | POA: Diagnosis not present

## 2015-04-26 DIAGNOSIS — F17210 Nicotine dependence, cigarettes, uncomplicated: Secondary | ICD-10-CM | POA: Diagnosis not present

## 2015-04-26 DIAGNOSIS — X58XXXA Exposure to other specified factors, initial encounter: Secondary | ICD-10-CM | POA: Insufficient documentation

## 2015-04-26 DIAGNOSIS — Y998 Other external cause status: Secondary | ICD-10-CM | POA: Diagnosis not present

## 2015-04-26 DIAGNOSIS — Y9389 Activity, other specified: Secondary | ICD-10-CM | POA: Insufficient documentation

## 2015-04-26 DIAGNOSIS — G8929 Other chronic pain: Secondary | ICD-10-CM | POA: Insufficient documentation

## 2015-04-26 LAB — HEMOGLOBIN AND HEMATOCRIT, BLOOD
HCT: 31.6 % — ABNORMAL LOW (ref 36.0–46.0)
HEMOGLOBIN: 11.2 g/dL — AB (ref 12.0–15.0)

## 2015-04-26 NOTE — ED Notes (Signed)
Abdominal pain x 4 days. Seen yesterday and was supposed to go to surgery for a lacerated spleen but left to go take care of her cat yesterday.

## 2015-04-26 NOTE — ED Provider Notes (Signed)
CSN: [id number]     Arrival date & time 04/26/15  1109 History   First MD Initiated Contact with Patient 04/26/15 1140     Chief Complaint  Patient presents with  . Abdominal Pain     HPI Patient was seen and evaluated in the emergency department yesterday for abdominal pain for days post colonoscopy and was found to have a grade 1 splenic laceration.  It was recommended that time that she be admitted the hospital for serial hemoglobins and the patient chose to leave the hospital against medical ice.  She was asked to return the emergency department for repeat hemoglobin in the morning.  She presents today for her repeat hemoglobin.  She reports she continues to have some left upper quadrant pain but reports no worsening of her symptoms.  She denies lightheadedness or shortness of breath with exertion.  She presents for repeat hemoglobin.   Past Medical History  Diagnosis Date  . History of cardiac arrhythmia   . Hypertension   . High cholesterol   . Chronic headaches   . Allergy history unknown   . Hypothyroidism   . Palpitations 12/01/2012    Echocardiogram - 7/13-normal EF, trace valvular lesions, mildly AI  . Cancer ([place])     skin, breast (left)  . Barrett's esophagus     [place]  . Acid reflux   . Asthma, mild intermittent   . Migraine headache   . PVC's (premature ventricular contractions)   . 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 Hyperlipidemia   . IBS (irritable bowel syndrome)   . Vestibular neuronitis     herpetic  . Osteopenia   . Cyst of left kidney     mult-locular Dr.Dahlstedt  . Hematuria     Dr. Zannie Cove  . History of colon polyps     04/2009 [place] unknown histology  . Incontinence, feces     09/2012  . Hypotonia     [name] 09/2012   Past Surgical History  Procedure Laterality Date  . Breast lumpectomy  2005?     left  . Tubal ligation    . Oophorectomy  1968?  Marland Kitchen Tonsilectomy, adenoidectomy, bilateral myringotomy and tubes   1961  . Appendectomy  1964  . Skin surgery  08/2012  . Colonoscopy  04/2009   Family History  Problem Relation Age of Onset  . Emphysema Mother   . Cancer Brother     prostate  . Cancer Father     prostate  . Heart disease Paternal Grandfather   . Bladder Cancer     Social History  Substance Use Topics  . Smoking status: Current Every Day Smoker -- 1.50 packs/day for 50 years    Types: Cigarettes  . Smokeless tobacco: Never Used  . Alcohol Use: Yes     Comment: rare   OB History    No data available     Review of Systems  All other systems reviewed and are negative.     Allergies  Demerol; Epinephrine; Gabapentin; and Quinine derivatives  Home Medications   Prior to Admission medications   Medication Sig Start Date End Date Taking? Authorizing Provider  alendronate (FOSAMAX) 70 MG tablet Take 70 mg by mouth once a week. Take with a full glass of water on an empty stomach.   Yes Historical Provider, MD  atorvastatin (LIPITOR) 10 MG tablet Take 10 mg by mouth daily.   Yes Historical Provider, MD  CALCIUM-VITAMIN  D PO Take 1 tablet by mouth daily.   Yes Historical Provider, MD  diltiazem (CARDIZEM CD) 240 MG 24 hr capsule Take 1 capsule (240 mg total) by mouth daily. 12/13/14  Yes Jerline Pain, MD  diltiazem (CARDIZEM) 60 MG tablet Take 1 tablet (60 mg total) by mouth every 6 (six) hours as needed. 12/13/14  Yes Jerline Pain, MD  hyoscyamine (NULEV) 0.125 MG TBDP disintergrating tablet Place 0.125 mg under the tongue every 4 (four) hours as needed for bladder spasms.    Yes Historical Provider, MD  levothyroxine (SYNTHROID, LEVOTHROID) 175 MCG tablet Take 175 mcg by mouth daily. 09/08/13  Yes Historical Provider, MD  Multiple Vitamin (MULTIVITAMIN) tablet Take 1 tablet by mouth daily. Nature's way   Yes Historical Provider, MD  vitamin C (ASCORBIC ACID) 500 MG tablet Take 500 mg by mouth daily.   Yes Historical Provider, MD   BP 136/67 mmHg  Pulse 86  Temp(Src) 98.4 F  (36.9 C) (Oral)  Resp 17  SpO2 96% Physical Exam  Constitutional: She is oriented to person, place, and time. She appears well-developed and well-nourished.  HENT:  Head: Normocephalic.  Eyes: EOM are normal.  Neck: Normal range of motion.  Pulmonary/Chest: Effort normal.  Abdominal: She exhibits no distension.  Mild left upper quadrant tenderness without guarding or rebound.  No peritoneal signs.  Musculoskeletal: Normal range of motion.  Neurological: She is alert and oriented to person, place, and time.  Psychiatric: She has a normal mood and affect.  Nursing note and vitals reviewed.   ED Course  Procedures (including critical care time) Labs Review Labs Reviewed  HEMOGLOBIN AND HEMATOCRIT, BLOOD - Abnormal; Notable for the following:    Hemoglobin 11.2 (*)    HCT 31.6 (*)    All other components within normal limits   HEMOGLOBIN  Date Value Ref Range Status  04/26/2015 11.2* 12.0 - 15.0 g/dL Final  04/25/2015 11.6* 12.0 - 15.0 g/dL Final      Imaging Review Ct Abdomen Pelvis W Contrast  04/25/2015  CLINICAL DATA:  Abdominal pain following endoscopy EXAM: CT ABDOMEN AND PELVIS WITH CONTRAST TECHNIQUE: Multidetector CT imaging of the abdomen and pelvis was performed using the standard protocol following bolus administration of intravenous contrast. CONTRAST:  73mL OMNIPAQUE IOHEXOL 300 MG/ML SOLN, 177mL ISOVUE-300 IOPAMIDOL (ISOVUE-300) INJECTION 61% COMPARISON:  09/15/2009 FINDINGS: Lung bases demonstrate a small left-sided pleural effusion and left lower lobe atelectasis. The liver, gallbladder, adrenal glands and pancreas are within normal limits. The right kidney is within normal limits. A large cystic lesion is noted in the upper pole of the left kidney stable from a prior ultrasound examination. It measures approximately 8 cm in greatest dimension. The spleen demonstrates evidence of a subcapsular hematoma with some mottled increased density consistent with more recent  hemorrhage. Additionally a small perisplenic component of hematoma is noted as well. The hematoma measures at least 6.8 x 5.3 cm in transverse and craniocaudad projection. Mass-effect upon the adjacent spleen is seen. Hypodensity is noted within the substance of the spleen consistent with a laceration. No splenic infarct is noted. The appendix is not visualized consistent with a prior surgical history. Dense free fluid is noted within the pelvis consistent with blood related to the splenic injury. The bladder is well distended. No pelvic mass lesion is seen. The bony structures are within normal limits. IMPRESSION: Changes consistent with splenic laceration with both subcapsular and perisplenic hematoma as described. Free fluid is noted within the pelvis consistent  with blood. No definitive active extravasation is identified. Stable left renal cystic lesion Small left-sided pleural effusion and compensatory atelectasis. Critical Value/emergent results were called by telephone at the time of interpretation on 04/25/2015 at 6:46 pm to Dr. Nat Christen , who verbally acknowledged these results. Electronically Signed   By: Inez Catalina M.D.   On: 04/25/2015 18:50   I have personally reviewed and evaluated these images and lab results as part of my medical decision-making.   EKG Interpretation None      MDM   Final diagnoses:  Left upper quadrant pain  Splenic artery injury, initial encounter    Patient is overall well-appearing.  Her vital signs are stable.  Her hemoglobin is stable.  Discharge home in good condition.    Jola Schmidt, MD 04/26/15 [phone]

## 2015-05-03 DIAGNOSIS — K5900 Constipation, unspecified: Secondary | ICD-10-CM | POA: Diagnosis not present

## 2015-05-03 DIAGNOSIS — Z86010 Personal history of colonic polyps: Secondary | ICD-10-CM | POA: Diagnosis not present

## 2015-05-03 DIAGNOSIS — D62 Acute posthemorrhagic anemia: Secondary | ICD-10-CM | POA: Diagnosis not present

## 2015-05-03 DIAGNOSIS — S36039D Unspecified laceration of spleen, subsequent encounter: Secondary | ICD-10-CM | POA: Diagnosis not present

## 2015-05-04 ENCOUNTER — Ambulatory Visit (INDEPENDENT_AMBULATORY_CARE_PROVIDER_SITE_OTHER): Payer: Medicare Other | Admitting: Psychology

## 2015-05-04 DIAGNOSIS — F411 Generalized anxiety disorder: Secondary | ICD-10-CM | POA: Diagnosis not present

## 2015-05-04 DIAGNOSIS — F439 Reaction to severe stress, unspecified: Secondary | ICD-10-CM

## 2015-05-19 ENCOUNTER — Ambulatory Visit: Payer: Medicare Other | Admitting: Psychology

## 2015-05-31 ENCOUNTER — Ambulatory Visit (INDEPENDENT_AMBULATORY_CARE_PROVIDER_SITE_OTHER): Payer: Medicare Other | Admitting: Psychology

## 2015-05-31 DIAGNOSIS — F439 Reaction to severe stress, unspecified: Secondary | ICD-10-CM | POA: Diagnosis not present

## 2015-05-31 DIAGNOSIS — F411 Generalized anxiety disorder: Secondary | ICD-10-CM

## 2015-06-21 ENCOUNTER — Ambulatory Visit (INDEPENDENT_AMBULATORY_CARE_PROVIDER_SITE_OTHER): Payer: Medicare Other | Admitting: Psychology

## 2015-06-21 DIAGNOSIS — F411 Generalized anxiety disorder: Secondary | ICD-10-CM | POA: Diagnosis not present

## 2015-06-21 DIAGNOSIS — F439 Reaction to severe stress, unspecified: Secondary | ICD-10-CM

## 2015-07-05 ENCOUNTER — Other Ambulatory Visit: Payer: Self-pay | Admitting: Family Medicine

## 2015-07-05 DIAGNOSIS — R5381 Other malaise: Secondary | ICD-10-CM

## 2015-07-07 ENCOUNTER — Other Ambulatory Visit: Payer: Self-pay | Admitting: Family Medicine

## 2015-07-07 ENCOUNTER — Ambulatory Visit (INDEPENDENT_AMBULATORY_CARE_PROVIDER_SITE_OTHER): Payer: Medicare Other | Admitting: Psychology

## 2015-07-07 DIAGNOSIS — F439 Reaction to severe stress, unspecified: Secondary | ICD-10-CM

## 2015-07-07 DIAGNOSIS — F411 Generalized anxiety disorder: Secondary | ICD-10-CM | POA: Diagnosis not present

## 2015-07-07 DIAGNOSIS — E2839 Other primary ovarian failure: Secondary | ICD-10-CM

## 2015-07-10 ENCOUNTER — Other Ambulatory Visit: Payer: Self-pay | Admitting: Family Medicine

## 2015-07-10 DIAGNOSIS — Z1231 Encounter for screening mammogram for malignant neoplasm of breast: Secondary | ICD-10-CM

## 2015-07-19 ENCOUNTER — Ambulatory Visit: Payer: Medicare Other | Admitting: Psychology

## 2015-07-31 ENCOUNTER — Ambulatory Visit (INDEPENDENT_AMBULATORY_CARE_PROVIDER_SITE_OTHER): Payer: Medicare Other | Admitting: Psychology

## 2015-07-31 DIAGNOSIS — F411 Generalized anxiety disorder: Secondary | ICD-10-CM

## 2015-07-31 DIAGNOSIS — F439 Reaction to severe stress, unspecified: Secondary | ICD-10-CM | POA: Diagnosis not present

## 2015-08-01 ENCOUNTER — Ambulatory Visit: Payer: Medicare Other | Admitting: Psychology

## 2015-08-14 DIAGNOSIS — L820 Inflamed seborrheic keratosis: Secondary | ICD-10-CM | POA: Diagnosis not present

## 2015-08-14 DIAGNOSIS — L570 Actinic keratosis: Secondary | ICD-10-CM | POA: Diagnosis not present

## 2015-08-14 DIAGNOSIS — D239 Other benign neoplasm of skin, unspecified: Secondary | ICD-10-CM | POA: Diagnosis not present

## 2015-08-21 ENCOUNTER — Ambulatory Visit (INDEPENDENT_AMBULATORY_CARE_PROVIDER_SITE_OTHER): Payer: Medicare Other | Admitting: Psychology

## 2015-08-21 DIAGNOSIS — F411 Generalized anxiety disorder: Secondary | ICD-10-CM | POA: Diagnosis not present

## 2015-08-21 DIAGNOSIS — F439 Reaction to severe stress, unspecified: Secondary | ICD-10-CM

## 2015-09-04 ENCOUNTER — Ambulatory Visit: Payer: Medicare Other | Admitting: Psychology

## 2015-09-11 ENCOUNTER — Ambulatory Visit
Admission: RE | Admit: 2015-09-11 | Discharge: 2015-09-11 | Disposition: A | Discharge status: Home / Self Care | Payer: Medicare Other | Source: Ambulatory Visit | Attending: Family Medicine | Admitting: Family Medicine

## 2015-09-11 DIAGNOSIS — Z780 Asymptomatic menopausal state: Secondary | ICD-10-CM | POA: Diagnosis not present

## 2015-09-11 DIAGNOSIS — Z1231 Encounter for screening mammogram for malignant neoplasm of breast: Secondary | ICD-10-CM | POA: Diagnosis not present

## 2015-09-11 DIAGNOSIS — E2839 Other primary ovarian failure: Secondary | ICD-10-CM

## 2015-09-11 DIAGNOSIS — M810 Age-related osteoporosis without current pathological fracture: Secondary | ICD-10-CM | POA: Diagnosis not present

## 2015-09-14 ENCOUNTER — Ambulatory Visit (INDEPENDENT_AMBULATORY_CARE_PROVIDER_SITE_OTHER): Payer: Medicare Other | Admitting: Psychology

## 2015-09-14 DIAGNOSIS — F439 Reaction to severe stress, unspecified: Secondary | ICD-10-CM | POA: Diagnosis not present

## 2015-10-02 DIAGNOSIS — M810 Age-related osteoporosis without current pathological fracture: Secondary | ICD-10-CM | POA: Diagnosis not present

## 2015-10-02 DIAGNOSIS — R7301 Impaired fasting glucose: Secondary | ICD-10-CM | POA: Diagnosis not present

## 2015-10-02 DIAGNOSIS — E039 Hypothyroidism, unspecified: Secondary | ICD-10-CM | POA: Diagnosis not present

## 2015-10-02 DIAGNOSIS — R350 Frequency of micturition: Secondary | ICD-10-CM | POA: Diagnosis not present

## 2015-10-02 DIAGNOSIS — K589 Irritable bowel syndrome without diarrhea: Secondary | ICD-10-CM | POA: Diagnosis not present

## 2015-10-02 DIAGNOSIS — I493 Ventricular premature depolarization: Secondary | ICD-10-CM | POA: Diagnosis not present

## 2015-10-02 DIAGNOSIS — Z1159 Encounter for screening for other viral diseases: Secondary | ICD-10-CM | POA: Diagnosis not present

## 2015-10-02 DIAGNOSIS — E7800 Pure hypercholesterolemia, unspecified: Secondary | ICD-10-CM | POA: Diagnosis not present

## 2015-10-02 DIAGNOSIS — Z0000 Encounter for general adult medical examination without abnormal findings: Secondary | ICD-10-CM | POA: Diagnosis not present

## 2015-10-05 ENCOUNTER — Ambulatory Visit (INDEPENDENT_AMBULATORY_CARE_PROVIDER_SITE_OTHER): Payer: Medicare Other | Admitting: Psychology

## 2015-10-05 DIAGNOSIS — F439 Reaction to severe stress, unspecified: Secondary | ICD-10-CM | POA: Diagnosis not present

## 2015-10-05 DIAGNOSIS — F411 Generalized anxiety disorder: Secondary | ICD-10-CM | POA: Diagnosis not present

## 2015-10-16 ENCOUNTER — Ambulatory Visit: Payer: Medicare Other | Admitting: Psychology

## 2015-10-16 DIAGNOSIS — L820 Inflamed seborrheic keratosis: Secondary | ICD-10-CM | POA: Diagnosis not present

## 2015-10-16 DIAGNOSIS — L859 Epidermal thickening, unspecified: Secondary | ICD-10-CM | POA: Diagnosis not present

## 2015-11-03 ENCOUNTER — Ambulatory Visit (INDEPENDENT_AMBULATORY_CARE_PROVIDER_SITE_OTHER): Payer: Medicare Other | Admitting: Psychology

## 2015-11-03 DIAGNOSIS — F439 Reaction to severe stress, unspecified: Secondary | ICD-10-CM | POA: Diagnosis not present

## 2015-11-03 DIAGNOSIS — F411 Generalized anxiety disorder: Secondary | ICD-10-CM | POA: Diagnosis not present

## 2015-11-09 ENCOUNTER — Ambulatory Visit (INDEPENDENT_AMBULATORY_CARE_PROVIDER_SITE_OTHER): Payer: Medicare Other | Admitting: Psychology

## 2015-11-09 DIAGNOSIS — F439 Reaction to severe stress, unspecified: Secondary | ICD-10-CM | POA: Diagnosis not present

## 2015-11-09 DIAGNOSIS — F411 Generalized anxiety disorder: Secondary | ICD-10-CM | POA: Diagnosis not present

## 2015-11-09 DIAGNOSIS — Z23 Encounter for immunization: Secondary | ICD-10-CM | POA: Diagnosis not present

## 2015-11-10 IMAGING — MG MM SCREENING BREAST TOMO BILATERAL
5 series · 6 of 13 positions shown · non-contrast
Comparison: Previous exam(s).

CLINICAL DATA: Screening.

EXAM:
DIGITAL SCREENING BILATERAL MAMMOGRAM WITH 3D TOMO WITH CAD

[L MLO]
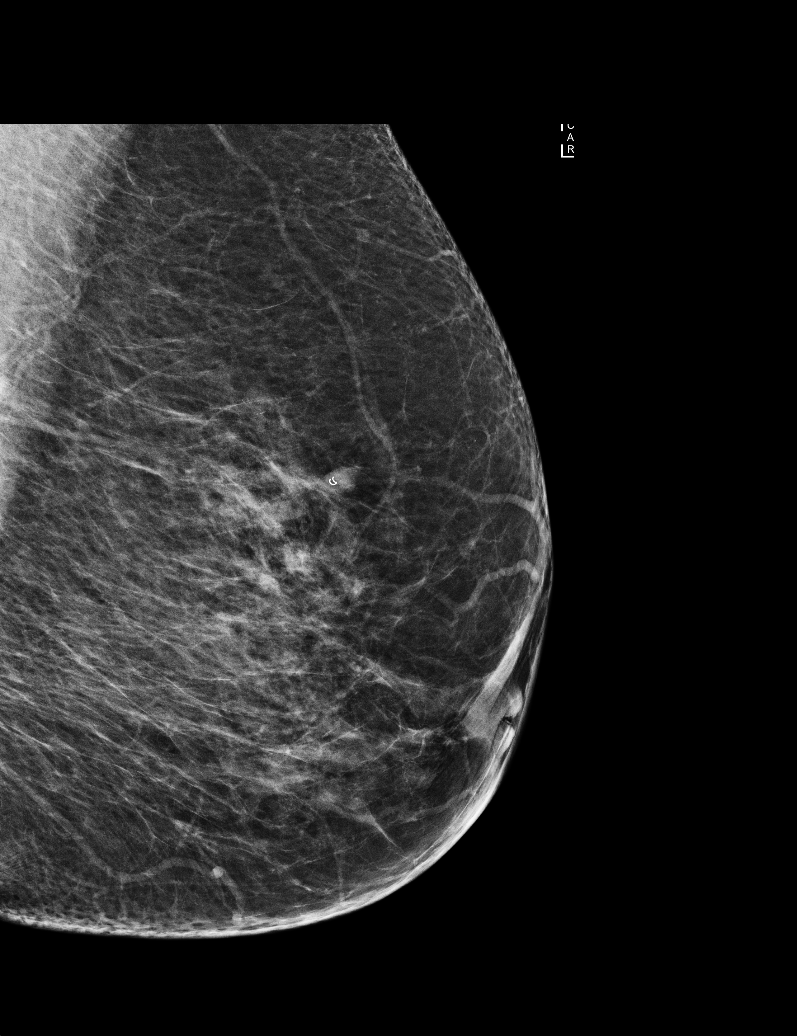

[R CC]
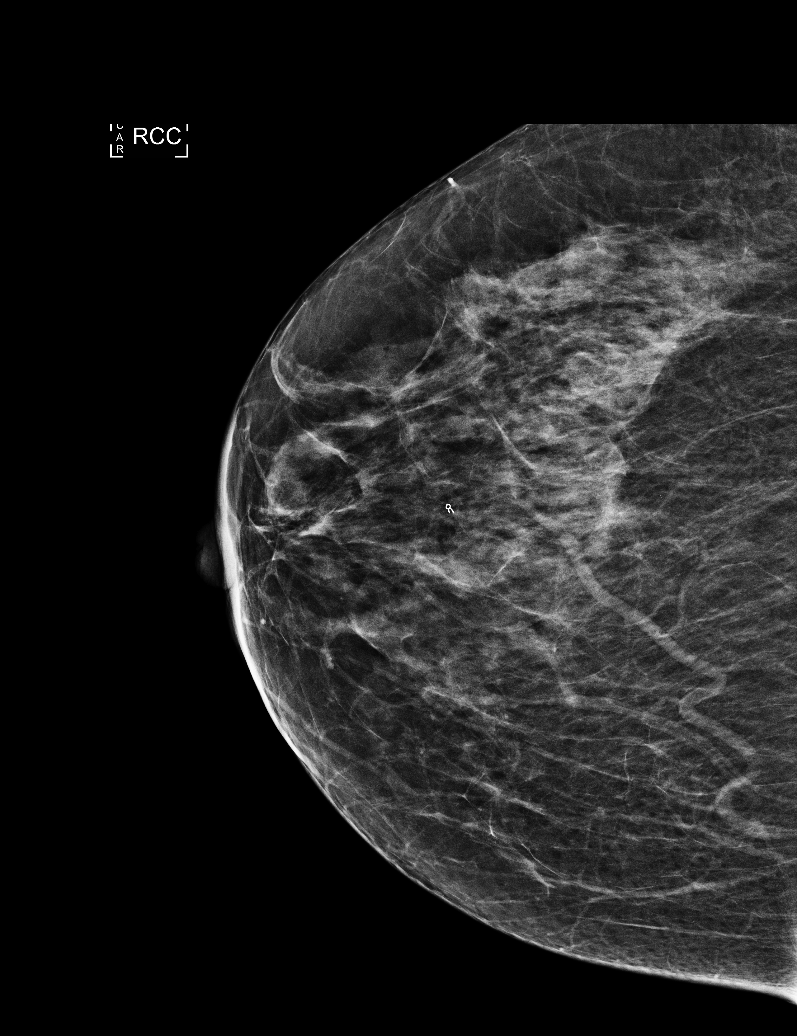

[R MLO]
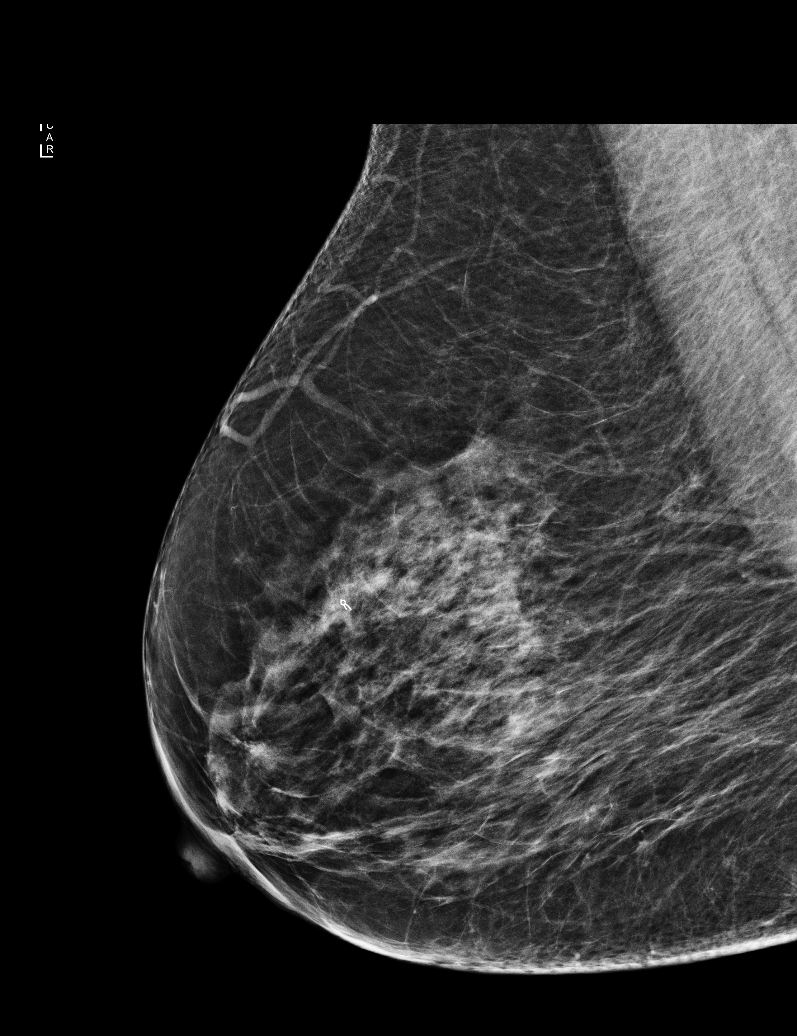

[R MLO tomo · 2 of 55 frames shown]
[frame 18/55]
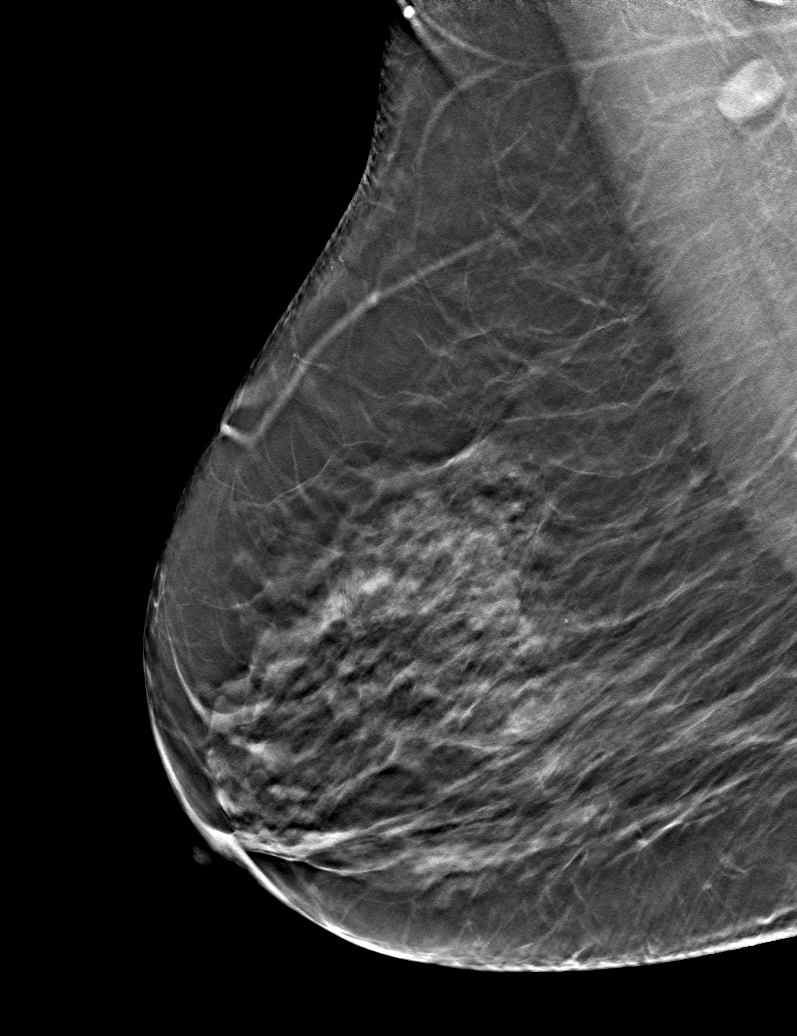
[frame 28/55]
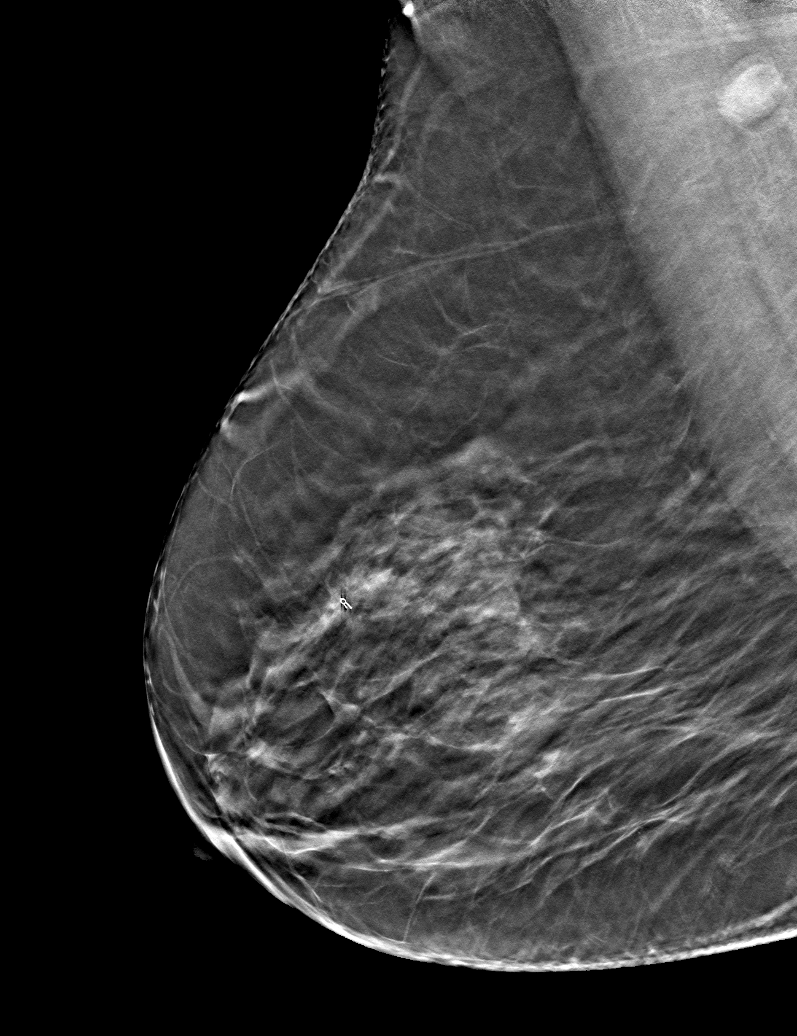

[L CC tomo · tomo slice 25/49.0]
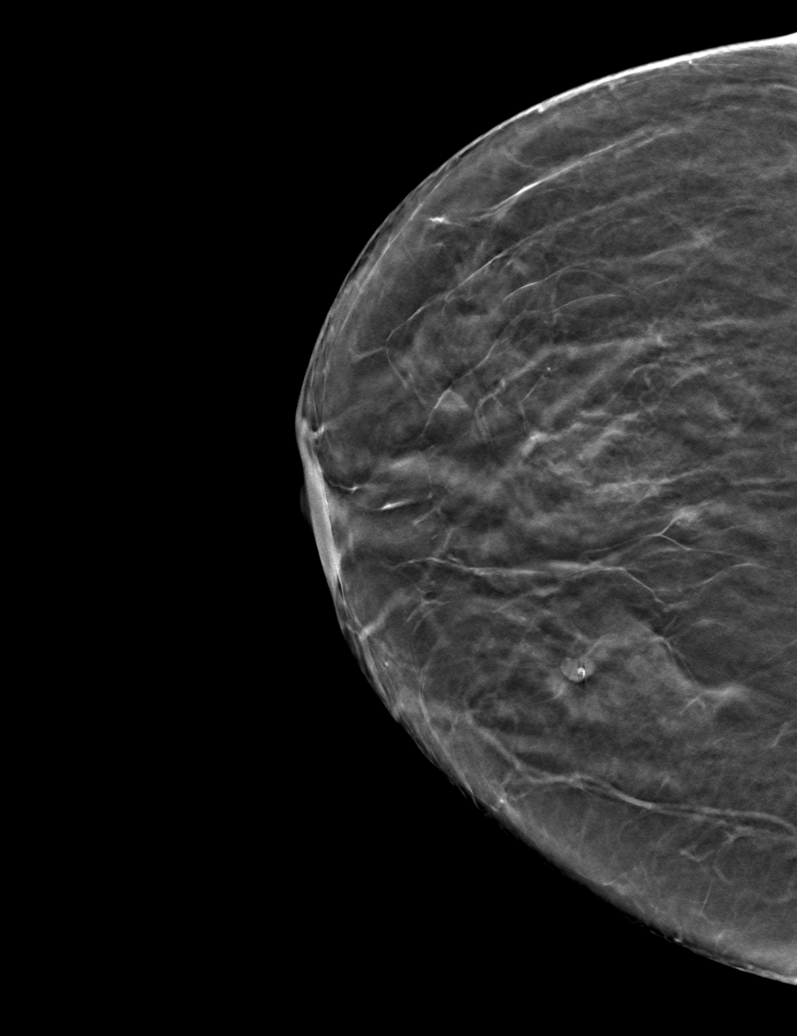

[6 of 13 positions shown; findings below may reference images not displayed]

ACR Breast Density Category c: The breast tissue is heterogeneously
dense, which may obscure small masses.
FINDINGS: There are no findings suspicious for malignancy. Images were
processed with CAD.
IMPRESSION: No mammographic evidence of malignancy. A result letter of this
screening mammogram will be mailed directly to the patient.

RECOMMENDATION:
Screening mammogram in one year. (Code:OA-G-1SS)

BI-RADS CATEGORY  1: Negative.

## 2015-11-24 ENCOUNTER — Encounter: Payer: Self-pay | Admitting: Cardiology

## 2015-11-24 ENCOUNTER — Ambulatory Visit (INDEPENDENT_AMBULATORY_CARE_PROVIDER_SITE_OTHER): Payer: Medicare Other | Admitting: Cardiology

## 2015-11-24 VITALS — BP 144/80 | HR 94 | Ht 66.5 in | Wt 141.2 lb

## 2015-11-24 DIAGNOSIS — I10 Essential (primary) hypertension: Secondary | ICD-10-CM

## 2015-11-24 DIAGNOSIS — R002 Palpitations: Secondary | ICD-10-CM | POA: Diagnosis not present

## 2015-11-24 NOTE — Progress Notes (Signed)
Bridget Mcdonald. [address], [place]  [address] Phone: [phone] Fax:  [phone]  Date:  11/24/2015   ID:  Bridget Mcdonald, DOB [birth year], MRN [id number]  PCP:  Aretta Nip, MD   History of Present Illness: Bridget Mcdonald is a 70 y.o. female with palpitations here for followup. 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 She's been taking diltiazem 240 mg once a day.   She has been battling left-sided headaches. She has seen neurology. MRA/MRI. 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 She tried physical therapy.   She had another sensation of palpitations when reaching to put her luggage away.   She comes in today feeling that her palpitations worse. When they start they ruin her day. Sometimes her heart rate was as high as 140 beats a minute.     Wt Readings from Last 3 Encounters:  11/24/15 141 lb 3.2 oz (64 kg)  01/31/15 145 lb 9.6 oz (66 kg)  12/27/14 145 lb 9.6 oz (66 kg)     Past Medical History:  Diagnosis Date  . Acid reflux   . Allergy history unknown   . Asthma, mild intermittent   . Barrett's esophagus    [place]  . Cancer ([place])    skin, breast (left)  . Chronic headaches   . Cyst of left kidney    mult-locular Dr.Dahlstedt  . Hematuria    Dr. Zannie Cove  . High cholesterol   . History of cardiac arrhythmia   . History of colon polyps    04/2009 [place] unknown histology  . Hyperlipidemia   . Hypertension   . Hypothyroidism   . Hypotonia    [name] 09/2012  . IBS (irritable bowel syndrome)   . Incontinence, feces    09/2012  . Migraine headache   . Osteopenia   . Palpitations 12/01/2012   Echocardiogram - 7/13-normal EF, trace valvular lesions, mildly AI  . PVC's (premature ventricular contractions)   . Vestibular neuronitis    herpetic  . Vocal cord polyps    sees  DR. Cristina Gong    Past Surgical History:  Procedure Laterality Date  . APPENDECTOMY  1964  . BREAST LUMPECTOMY  2005?    left  . COLONOSCOPY  04/2009  . OOPHORECTOMY  1968?  Marland Kitchen SKIN SURGERY  08/2012  . TONSILECTOMY, ADENOIDECTOMY, BILATERAL MYRINGOTOMY AND TUBES  1961  . TUBAL LIGATION      Current Outpatient Prescriptions  Medication Sig Dispense Refill  . alendronate (FOSAMAX) 70 MG tablet Take 70 mg by mouth once a week. Take with a full glass of water on an empty stomach.    Marland Kitchen atorvastatin (LIPITOR) 10 MG tablet Take 10 mg by mouth daily.    Marland Kitchen CALCIUM-VITAMIN D PO Take 1 tablet by mouth daily.    Marland Kitchen diltiazem (CARDIZEM CD) 240 MG 24 hr capsule Take 1 capsule (240 mg total) by mouth daily. 90 capsule 3  . diltiazem (CARDIZEM) 60 MG tablet Take 1 tablet (60 mg total) by mouth every 6 (six) hours as needed. 90 tablet 3  . hyoscyamine (NULEV) 0.125 MG TBDP disintergrating tablet Place 0.125 mg under the tongue every 4 (four) hours as needed (GI spasms).     Marland Kitchen levothyroxine (SYNTHROID, LEVOTHROID) 175 MCG tablet Take 175 mcg by mouth daily.  0  . Multiple Vitamin (MULTIVITAMIN) tablet Take 1  tablet by mouth daily. Nature's way    . vitamin C (ASCORBIC ACID) 500 MG tablet Take 500 mg by mouth daily.     No current facility-administered medications for this visit.     Allergies:    Allergies  Allergen Reactions  . Demerol [Meperidine]     hallucinations  . Epinephrine     Super hyper  . Gabapentin Diarrhea  . Quinine Derivatives     deaf    Social History:  The patient  reports that she has been smoking Cigarettes.  She has a 75.00 pack-year smoking history. She has never used smokeless tobacco. She reports that she drinks alcohol. She reports that she does not use drugs.   ROS:  Please see the history of present illness.   No CP, no SOB. Rushing in left ear at times.     PHYSICAL EXAM: VS:  BP (!) 144/80   Pulse 94   Ht 5' 6.5" (1.689 m)   Wt 141 lb 3.2 oz (64 kg)   LMP   (LMP Unknown)   BMI 22.45 kg/m  Well nourished, well developed, in no acute distress HEENT: normal Neck: no JVD Cardiac:  normal S1, S2; RRR; no murmur Lungs:  clear to auscultation bilaterally, no wheezing, rhonchi or rales Abd: soft, nontender, no hepatomegaly Ext: no edema2+ PT pulses. Varicose veins. Skin: warm and dry Neuro: no focal abnormalities noted  EKG:   Today  11/24/15-94 beats a minute biatrial enlargement 12/13/14-sinus rhythm, 86 , no other abnormalities. Personally viewed-prior11/18/15-sinus rhythm 86 bpm with no other abnormalities prior NSR 67    ASSESSMENT AND PLAN:  1. Palpitations -previously much improved on diltiazem. She now feels like they are worse. Sometimes she will take the extra 60 mg but this makes her feel washed out. She may have had the sensation of PVC previously. Prior nuclear stress test in 2013 reassuring. Echocardiogram reassuring in 2013. I will check a 30 day event monitor to ensure that she is now not developing atrial fibrillation. Discussed the possibility of changing her over to metoprolol. 2. Hypertension-usually her blood pressure is well controlled. No changes made to her regimen today. She was slightly anxious. Sometimes they have been elevated at home. 3.  headaches- left-sided upper head pain. She had benign workup  4. 6 week follow-up after monitor  Signed, Candee Furbish, MD [facility]  11/24/2015 3:56 PM

## 2015-11-24 NOTE — Patient Instructions (Signed)
Medication Instructions:  The current medical regimen is effective;  continue present plan and medications.  Testing/Procedures: Your physician has recommended that you wear an event monitor for 30 days. Event monitors are medical devices that record the heart's electrical activity. Doctors most often [place] these monitors to diagnose arrhythmias. Arrhythmias are problems with the speed or rhythm of the heartbeat. The monitor is a small, portable device. You can wear one while you do your normal daily activities. This is usually used to diagnose what is causing palpitations/syncope (passing out).  Follow-Up: Follow up in approximately 6 weeks with Dr Marlou Porch.  If you need a refill on your cardiac medications before your next appointment, please call your pharmacy.  Thank you for choosing [facility]!!

## 2015-11-27 ENCOUNTER — Ambulatory Visit (INDEPENDENT_AMBULATORY_CARE_PROVIDER_SITE_OTHER): Payer: Medicare Other | Admitting: Psychology

## 2015-11-27 DIAGNOSIS — F419 Anxiety disorder, unspecified: Secondary | ICD-10-CM | POA: Diagnosis not present

## 2015-11-28 ENCOUNTER — Ambulatory Visit (INDEPENDENT_AMBULATORY_CARE_PROVIDER_SITE_OTHER): Payer: Medicare Other

## 2015-11-28 DIAGNOSIS — R002 Palpitations: Secondary | ICD-10-CM

## 2015-12-08 ENCOUNTER — Telehealth: Payer: Self-pay | Admitting: Physician Assistant

## 2015-12-08 NOTE — Telephone Encounter (Signed)
    Called by [employer] to report an autotrigger of 90 seconds of atrial fibrillation with RVR HR 150. I tried calling patient to discuss but she did not answer. I had strips faxed to me in cath lab and reviewed with Dr. Caryl Comes who did feel like this was indeed afib. Will forward to Dr. Marlou Porch and the office can call her Monday (closed today for holiday) and discuss options. We could also get her set up in the afib clinic to see Orson Eva NP.  Angelena Form PA-C  [facility]

## 2015-12-11 ENCOUNTER — Telehealth: Payer: Self-pay

## 2015-12-11 NOTE — Telephone Encounter (Signed)
Please set her up with atrial fibrillation clinic, Roderic Palau. Thanks. Candee Furbish, MD

## 2015-12-11 NOTE — Telephone Encounter (Signed)
Called, left voice message to call back regarding [employer] transmission. Requested return call to discuss.

## 2015-12-12 NOTE — Telephone Encounter (Signed)
Appears patient has been scheduled to be seen in the [facility] 12/4 at 11 am.

## 2015-12-17 ENCOUNTER — Other Ambulatory Visit: Payer: Self-pay | Admitting: Cardiology

## 2015-12-18 ENCOUNTER — Ambulatory Visit (HOSPITAL_COMMUNITY)
Admission: RE | Admit: 2015-12-18 | Discharge: 2015-12-18 | Disposition: A | Discharge status: Home / Self Care | Payer: Medicare Other | Source: Ambulatory Visit | Attending: Nurse Practitioner | Admitting: Nurse Practitioner

## 2015-12-18 ENCOUNTER — Encounter (HOSPITAL_COMMUNITY): Payer: Self-pay | Admitting: Nurse Practitioner

## 2015-12-18 VITALS — BP 132/70 | HR 90 | Ht 66.5 in | Wt 142.2 lb

## 2015-12-18 DIAGNOSIS — I480 Paroxysmal atrial fibrillation: Secondary | ICD-10-CM | POA: Diagnosis not present

## 2015-12-18 DIAGNOSIS — K2270 Barrett's esophagus without dysplasia: Secondary | ICD-10-CM | POA: Diagnosis not present

## 2015-12-18 DIAGNOSIS — J45909 Unspecified asthma, uncomplicated: Secondary | ICD-10-CM | POA: Diagnosis not present

## 2015-12-18 DIAGNOSIS — H8120 Vestibular neuronitis, unspecified ear: Secondary | ICD-10-CM | POA: Insufficient documentation

## 2015-12-18 DIAGNOSIS — K219 Gastro-esophageal reflux disease without esophagitis: Secondary | ICD-10-CM | POA: Diagnosis not present

## 2015-12-18 DIAGNOSIS — I493 Ventricular premature depolarization: Secondary | ICD-10-CM | POA: Diagnosis not present

## 2015-12-18 DIAGNOSIS — I4510 Unspecified right bundle-branch block: Secondary | ICD-10-CM | POA: Diagnosis not present

## 2015-12-18 DIAGNOSIS — N281 Cyst of kidney, acquired: Secondary | ICD-10-CM | POA: Insufficient documentation

## 2015-12-18 DIAGNOSIS — E7800 Pure hypercholesterolemia, unspecified: Secondary | ICD-10-CM | POA: Insufficient documentation

## 2015-12-18 DIAGNOSIS — Z85828 Personal history of other malignant neoplasm of skin: Secondary | ICD-10-CM | POA: Insufficient documentation

## 2015-12-18 DIAGNOSIS — Z86010 Personal history of colonic polyps: Secondary | ICD-10-CM | POA: Insufficient documentation

## 2015-12-18 DIAGNOSIS — G43909 Migraine, unspecified, not intractable, without status migrainosus: Secondary | ICD-10-CM | POA: Diagnosis not present

## 2015-12-18 DIAGNOSIS — E039 Hypothyroidism, unspecified: Secondary | ICD-10-CM | POA: Insufficient documentation

## 2015-12-18 DIAGNOSIS — I499 Cardiac arrhythmia, unspecified: Secondary | ICD-10-CM | POA: Insufficient documentation

## 2015-12-18 DIAGNOSIS — M8580 Other specified disorders of bone density and structure, unspecified site: Secondary | ICD-10-CM | POA: Diagnosis not present

## 2015-12-18 DIAGNOSIS — F17210 Nicotine dependence, cigarettes, uncomplicated: Secondary | ICD-10-CM | POA: Insufficient documentation

## 2015-12-18 DIAGNOSIS — R002 Palpitations: Secondary | ICD-10-CM | POA: Diagnosis not present

## 2015-12-18 DIAGNOSIS — Z853 Personal history of malignant neoplasm of breast: Secondary | ICD-10-CM | POA: Diagnosis not present

## 2015-12-18 DIAGNOSIS — K589 Irritable bowel syndrome without diarrhea: Secondary | ICD-10-CM | POA: Diagnosis not present

## 2015-12-18 NOTE — Progress Notes (Addendum)
Primary Care Physician: Aretta Nip, MD Referring Physician:   Kaleiyah Cauley is a 70 y.o. female with a h/o palpitations,PVC's, tobacco abuse, PVD, hypothyroidism,that is in the afib clinic for evaluation. She recently saw Dr. Marlou Porch and was c/o of increase in palpitations. Monitor was placed and [employer] reported an auto trigger of 90 secs of afib with RVR 150 bpm. This was confirmed with Dr. Caryl Comes and she was sent to the afib clinic to discuss anticoagulation. Discussed lifestyle risk factors with the pt and she was found to have risk factors for increased afib burden with tobacco abuse, high caffeine intake, possible sleep apnea(states does not sleep long enough to have a sleep study), no regular exercise. Denies alcohol use. She has a CHA2DS2VAScof at least 4(htn, age , female, PVD).   Pt does describe intermittent h/o passing blood clots with mucus from rectum and has been w/u with Dr. Cristina Gong in the past without any definite bleeding source.  Today, she denies symptoms of orthopnea, PND,  dizziness, presyncope, syncope, or neurologic sequela.  Intermittent palpitations, claudication symptoms, positive for exertional chest discomfort/diaphoresis.The patient is tolerating medications without difficulties and is otherwise without complaint today.   Past Medical History:  Diagnosis Date  . Acid reflux   . Allergy history unknown   . Asthma, mild intermittent   . Barrett's esophagus    [place]  . Cancer ([place])    skin, breast (left)  . Chronic headaches   . Cyst of left kidney    mult-locular Dr.Dahlstedt  . Hematuria    Dr. Zannie Cove  . High cholesterol   . History of cardiac arrhythmia   . History of colon polyps    04/2009 [place] unknown histology  . Hyperlipidemia   . Hypertension   . Hypothyroidism   . Hypotonia    [name] 09/2012  . IBS (irritable bowel syndrome)   . Incontinence, feces    09/2012  . Migraine headache   . Osteopenia   .  Palpitations 12/01/2012   Echocardiogram - 7/13-normal EF, trace valvular lesions, mildly AI  . PVC's (premature ventricular contractions)   . Vestibular neuronitis    herpetic  . Vocal cord polyps    sees DR. Cristina Gong   Past Surgical History:  Procedure Laterality Date  . APPENDECTOMY  1964  . BREAST LUMPECTOMY  2005?    left  . COLONOSCOPY  04/2009  . OOPHORECTOMY  1968?  Marland Kitchen SKIN SURGERY  08/2012  . TONSILECTOMY, ADENOIDECTOMY, BILATERAL MYRINGOTOMY AND TUBES  1961  . TUBAL LIGATION      Current Outpatient Prescriptions  Medication Sig Dispense Refill  . alendronate (FOSAMAX) 70 MG tablet Take 70 mg by mouth once a week. Take with a full glass of water on an empty stomach.    Marland Kitchen atorvastatin (LIPITOR) 10 MG tablet Take 10 mg by mouth daily.    Marland Kitchen CALCIUM-VITAMIN D PO Take 1 tablet by mouth daily.    Marland Kitchen diltiazem (CARDIZEM) 60 MG tablet Take 1 tablet (60 mg total) by mouth every 6 (six) hours as needed. 90 tablet 3  . hyoscyamine (NULEV) 0.125 MG TBDP disintergrating tablet Place 0.125 mg under the tongue every 4 (four) hours as needed (GI spasms).     Marland Kitchen levothyroxine (SYNTHROID, LEVOTHROID) 175 MCG tablet Take 175 mcg by mouth daily.  0  . Multiple Vitamin (MULTIVITAMIN) tablet Take 1 tablet by mouth daily. Nature's way    . vitamin C (ASCORBIC ACID) 500 MG tablet Take 500 mg  by mouth daily.    Marland Kitchen CARTIA XT 240 MG 24 hr capsule TAKE 1 CAPSULE (240 MG TOTAL) BY MOUTH DAILY. 90 capsule 3   No current facility-administered medications for this encounter.     Allergies  Allergen Reactions  . Demerol [Meperidine]     hallucinations  . Epinephrine     Super hyper  . Gabapentin Diarrhea  . Quinine Derivatives     deaf    Social History   Social History  . Marital status: Single    Spouse name: N/A  . Number of children: 1  . Years of education: Post Grad   Occupational History  . Retired       Secretary/administrator   Social History Main Topics  . Smoking status: Current Every Day  Smoker    Packs/day: 1.50    Years: 50.00    Types: Cigarettes  . Smokeless tobacco: Never Used  . Alcohol use Yes     Comment: rare  . Drug use: No  . Sexual activity: Not on file   Other Topics Concern  . Not on file   Social History Narrative   Lives at home with herself.   Caffeine use: 2 cups per day    Family History  Problem Relation Age of Onset  . Emphysema Mother   . Cancer Brother     prostate  . Cancer Father     prostate  . Heart disease Paternal Grandfather   . Bladder Cancer      ROS- All systems are reviewed and negative except as per the HPI above  Physical Exam: Vitals:   12/18/15 1033  BP: 132/70  Pulse: 90  Weight: 142 lb 3.2 oz (64.5 kg)  Height: 5' 6.5" (1.689 m)   Wt Readings from Last 3 Encounters:  12/18/15 142 lb 3.2 oz (64.5 kg)  11/24/15 141 lb 3.2 oz (64 kg)  01/31/15 145 lb 9.6 oz (66 kg)    Labs: Lab Results  Component Value Date   NA 138 04/25/2015   K 3.6 04/25/2015   CL 104 04/25/2015   CO2 27 04/25/2015   GLUCOSE 129 (H) 04/25/2015   BUN 10 04/25/2015   CREATININE 0.73 04/25/2015   CALCIUM 8.6 (L) 04/25/2015   No results found for: INR No results found for: CHOL, HDL, LDLCALC, TRIG   GEN- The patient is well appearing, alert and oriented x 3 today.   Head- normocephalic, atraumatic Eyes-  Sclera clear, conjunctiva pink Ears- hearing intact Oropharynx- clear Neck- supple, no JVP Lymph- no cervical lymphadenopathy Lungs- Clear to ausculation bilaterally, normal work of breathing Heart- Regular rate and rhythm, no murmurs, rubs or gallops, PMI not laterally displaced GI- soft, NT, ND, + BS Extremities- no clubbing, cyanosis, or edema MS- no significant deformity or atrophy Skin- no rash or lesion Psych- euthymic mood, full affect Neuro- strength and sensation are intact  EKG-NSR at 90 bpm, pr int `138 ms, qrs int 94 ms, qtc 455 ms Epic records rrviewed    Assessment and Plan: 1. Paroxysmal afib General  education re afib Described risk of stroke and chadsvasc score of at least 4 Described benefit of comadin, eliquis and xarelto and bleeding precautions Does describe intermittent passing of blood clots from rectum and will message Dr. Cristina Gong to see if anticoagulants can be used. Pt seems skeptical of anticoagulation and is not ready to commit to use today She will check with her insurance company for the cost and see if one is more  feasible for her over  the other Will update echo and will see back after that to further discuss anticoagulation if she has not committed prior to that  2. Intermittent chest pain/claudication Encouraged to discuss further with Dr. Etter Sjogren on f/u 1/8 She was also encouraged to discuss further with him her claudication symptoms which greatly limit her activities  Will see back after echo  Butch Penny C. Satoria Dunlop, [facility] [address] [place], [place] [address] [phone]

## 2015-12-18 NOTE — Addendum Note (Signed)
Encounter addended by: Sherran Needs, NP on: 12/18/2015  9:03 PM<BR>    Actions taken: Sign clinical note

## 2015-12-18 NOTE — Addendum Note (Signed)
Encounter addended by: Sherran Needs, NP on: 12/18/2015  8:48 PM<BR>    Actions taken: LOS modified

## 2015-12-26 ENCOUNTER — Telehealth (HOSPITAL_COMMUNITY): Payer: Self-pay | Admitting: Nurse Practitioner

## 2015-12-26 ENCOUNTER — Ambulatory Visit (INDEPENDENT_AMBULATORY_CARE_PROVIDER_SITE_OTHER): Payer: Medicare Other | Admitting: Psychology

## 2015-12-26 DIAGNOSIS — F419 Anxiety disorder, unspecified: Secondary | ICD-10-CM | POA: Diagnosis not present

## 2015-12-26 NOTE — Telephone Encounter (Signed)
-----   Message from Ronald Lobo, MD sent at 12/26/2015  1:25 AM EST ----- Regarding: RE: Anticoagulation use Hi Butch Penny,  This patient did have moderate internal hemorrhoids, without an alternative cause for intermittent rectal bleeding, seen on her colonoscopy this past April.  Her hemorrhoids will likely continue to bleed from time to time, and being on anticoagulation will likely make those bleeding episodes somewhat more severe.     However, hemorrhoidal bleeding is usually not destabilizing, and I don't think that the patient being on anticoagulation would intensify any future hemorrhoidal bleeding episodes to the point of destabilization, anemia, or need for hospitalization.    In short, I do not feel that a history of (presumed) periodic hemorrhoidal bleeding is a contraindication to the use of DOAC's.   The patient could be instructed in parameters for which to go to ER, eg, bleeding that persists in absence of further BM's, or dramatically more blood loss than she is accustomed to seeing compared to previous episodes, or sx of anemia (weakness/dizziness).  I hope this helps.  --R. Buccini, MD  ----- Message ----- From: Sherran Needs, NP Sent: 12/18/2015   9:03 PM To: Ronald Lobo, MD Subject: Anticoagulation use                            Hello Dr. Cristina Gong,  Can you please comment on pt's ability to use a DOAC for afib. She says that she has intermittent issues with passing blood clots from the rectum and had a colonoscopy recently without any bleeding source identified.  Thank you, Ceasar Lund  Afib clinic [facility]

## 2015-12-26 NOTE — Telephone Encounter (Signed)
Left message for pt that it would be ok to take anticoagulants per  Dr. Cristina Gong. Pt is to be checking price of anitacoagulants.

## 2016-01-01 ENCOUNTER — Ambulatory Visit (HOSPITAL_COMMUNITY)
Admission: RE | Admit: 2016-01-01 | Discharge: 2016-01-01 | Disposition: A | Discharge status: Home / Self Care | Payer: Medicare Other | Source: Ambulatory Visit | Attending: Family Medicine | Admitting: Family Medicine

## 2016-01-01 DIAGNOSIS — I480 Paroxysmal atrial fibrillation: Secondary | ICD-10-CM | POA: Diagnosis not present

## 2016-01-01 DIAGNOSIS — I4891 Unspecified atrial fibrillation: Secondary | ICD-10-CM | POA: Diagnosis not present

## 2016-01-01 DIAGNOSIS — I371 Nonrheumatic pulmonary valve insufficiency: Secondary | ICD-10-CM | POA: Insufficient documentation

## 2016-01-01 DIAGNOSIS — I351 Nonrheumatic aortic (valve) insufficiency: Secondary | ICD-10-CM | POA: Diagnosis not present

## 2016-01-01 NOTE — Progress Notes (Signed)
  Echocardiogram 2D Echocardiogram has been performed.  Bridget Mcdonald 01/01/2016, 5:24 PM

## 2016-01-03 LAB — ECHOCARDIOGRAM COMPLETE
E decel time: 236 msec
EERAT: 6.98
FS: 44 % (ref 28–44)
IV/PV OW: 1
LA diam end sys: 29 mm
LA diam index: 1.67 cm/m2
LASIZE: 29 mm
LAVOLA4C: 36.3 mL
LV E/e'average: 6.98
LV TDI E'LATERAL: 8.27
LV e' LATERAL: 8.27 cm/s
LVEEMED: 6.98
LVOT VTI: 23 cm
LVOT area: 2.54 cm2
LVOT diameter: 18 mm
LVOT peak grad rest: 5 mmHg
LVOT peak vel: 115 cm/s
LVOTSV: 58 mL
Lateral S' vel: 13.5 cm/s
MV Dec: 236
MV pk E vel: 57.7 m/s
MVPKAVEL: 71.3 m/s
P 1/2 time: 383 ms
PW: 8.55 mm — AB (ref 0.6–1.1)
TAPSE: 20.7 mm
TDI e' medial: 5.68

## 2016-01-09 ENCOUNTER — Ambulatory Visit (INDEPENDENT_AMBULATORY_CARE_PROVIDER_SITE_OTHER): Payer: Medicare Other | Admitting: Psychology

## 2016-01-09 DIAGNOSIS — F331 Major depressive disorder, recurrent, moderate: Secondary | ICD-10-CM

## 2016-01-09 DIAGNOSIS — F411 Generalized anxiety disorder: Secondary | ICD-10-CM

## 2016-01-22 ENCOUNTER — Encounter: Payer: Self-pay | Admitting: Cardiology

## 2016-01-22 ENCOUNTER — Ambulatory Visit (INDEPENDENT_AMBULATORY_CARE_PROVIDER_SITE_OTHER): Payer: Medicare Other | Admitting: Cardiology

## 2016-01-22 VITALS — BP 140/70 | HR 90 | Ht 66.5 in | Wt 142.4 lb

## 2016-01-22 DIAGNOSIS — I10 Essential (primary) hypertension: Secondary | ICD-10-CM | POA: Diagnosis not present

## 2016-01-22 DIAGNOSIS — R002 Palpitations: Secondary | ICD-10-CM | POA: Diagnosis not present

## 2016-01-22 DIAGNOSIS — I480 Paroxysmal atrial fibrillation: Secondary | ICD-10-CM

## 2016-01-22 MED ORDER — APIXABAN 5 MG PO TABS: 5.0000 mg | ORAL_TABLET | Freq: Two times a day (BID) | ORAL | 6 refills | Status: DC

## 2016-01-22 NOTE — Progress Notes (Addendum)
[facility] [address] [place], [place]  [address] Phone: [phone] Fax:  [phone]  Date:  01/22/2016   ID:  Bridget Mcdonald, DOB May 29, 1945, MRN [id number]  PCP:  Aretta Nip, MD   History of Present Illness: Bridget Mcdonald is a 71 y.o. female with paroxysmal atrial fibrillation, anxiety palpitations here for followup. 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 She's been taking diltiazem.   She has been battling left-sided headaches. She has seen neurology. MRA/MRI. 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 She tried physical therapy.  She ended up seeing Roderic Palau. Recommended anticoagulation. She has several reasons previously why she was hesitant to take the medication. I encouraged her at this visit.  No chest pain, no shortness of breath.   Wt Readings from Last 3 Encounters:  01/22/16 142 lb 6.4 oz (64.6 kg)  12/18/15 142 lb 3.2 oz (64.5 kg)  11/24/15 141 lb 3.2 oz (64 kg)     Past Medical History:  Diagnosis Date  . Acid reflux   . Allergy history unknown   . Asthma, mild intermittent   . Barrett's esophagus    [place]  . Cancer ([place])    skin, breast (left)  . Chronic headaches   . Cyst of left kidney    mult-locular Dr.Dahlstedt  . Hematuria    Dr. Zannie Cove  . High cholesterol   . History of cardiac arrhythmia   . History of colon polyps    04/2009 [place] unknown histology  . Hyperlipidemia   . Hypertension   . Hypothyroidism   . Hypotonia    [name] 09/2012  . IBS (irritable bowel syndrome)   . Incontinence, feces    09/2012  . Migraine headache   . Osteopenia   . Palpitations 12/01/2012   Echocardiogram - 7/13-normal EF, trace valvular lesions, mildly AI  . PVC's (premature ventricular contractions)   . Vestibular neuronitis    herpetic  . Vocal cord polyps    sees  DR. Cristina Gong    Past Surgical History:  Procedure Laterality Date  . APPENDECTOMY  1964  . BREAST LUMPECTOMY  2005?    left  . COLONOSCOPY  04/2009  . OOPHORECTOMY  1968?  Marland Kitchen SKIN SURGERY  08/2012  . TONSILECTOMY, ADENOIDECTOMY, BILATERAL MYRINGOTOMY AND TUBES  1961  . TUBAL LIGATION      Current Outpatient Prescriptions  Medication Sig Dispense Refill  . alendronate (FOSAMAX) 70 MG tablet Take 70 mg by mouth once a week. Take with a full glass of water on an empty stomach.    Mar[place] Kitchen atorvastatin (LIPITOR) 10 MG tablet Take 10 mg by mouth daily.    Marland Kitchen CALCIUM-VITAMIN D PO Take 1 tablet by mouth daily.    Marland Kitchen CARTIA XT 240 MG 24 hr capsule TAKE 1 CAPSULE (240 MG TOTAL) BY MOUTH DAILY. 90 capsule 3  . diltiazem (CARDIZEM) 60 MG tablet Take 1 tablet (60 mg total) by mouth every 6 (six) hours as needed. 90 tablet 3  . hyoscyamine (NULEV) 0.125 MG TBDP disintergrating tablet Place 0.125 mg under the tongue every 4 (four) hours as needed (GI spasms).     Marland Kitchen levothyroxine (SYNTHROID, LEVOTHROID) 175 MCG tablet Take 175 mcg by mouth daily.  0  . Multiple Vitamin (MULTIVITAMIN) tablet Take 1 tablet by mouth daily. Nature's way    . vitamin C (ASCORBIC  ACID) 500 MG tablet Take 500 mg by mouth daily.    Marland Kitchen apixaban (ELIQUIS) 5 MG TABS tablet Take 1 tablet (5 mg total) by mouth 2 (two) times daily. 60 tablet 6   No current facility-administered medications for this visit.     Allergies:    Allergies  Allergen Reactions  . Demerol [Meperidine]     hallucinations  . Epinephrine     Super hyper  . Gabapentin Diarrhea  . Quinine Derivatives     deaf    Social History:  The patient  reports that she has been smoking Cigarettes.  She has a 75.00 pack-year smoking history. She has never used smokeless tobacco. She reports that she drinks alcohol. She reports that she does not use drugs.   ROS:  Please see the history of present illness.   No CP, no SOB. Rushing in left ear at times.     PHYSICAL  EXAM: VS:  BP 140/70   Pulse 90   Ht 5' 6.5" (1.689 m)   Wt 142 lb 6.4 oz (64.6 kg)   LMP  (LMP Unknown)   BMI 22.64 kg/m  Well nourished, well developed, in no acute distress  HEENT: normal  Neck: no JVD  Cardiac:  normal S1, S2; RRR; no murmur  Lungs:  clear to auscultation bilaterally, no wheezing, rhonchi or rales  Abd: soft, nontender, no hepatomegaly  Ext: no edema 2+ PT pulses. Varicose veins. Skin: warm and dry  Neuro: no focal abnormalities noted  EKG:   Today  11/24/15-94 beats a minute biatrial enlargement 12/13/14-sinus rhythm, 86 , no other abnormalities. Personally viewed-prior11/18/15-sinus rhythm 86 bpm with no other abnormalities prior NSR 67    ASSESSMENT AND PLAN:  1. Parox AFIB-Palpitations -AFIB 150 brief RVR. She is quite skeptical about use of anticoagulation.  Worried about bleeding hemorrhoid. Discussed with GI, okay to use, Dr. Cristina Gong. Previously much improved on diltiazem. We'll start Eliquis 5 mg twice a day. Prior nuclear stress test in 2013 reassuring. Echocardiogram reassuring in 2013 as well as 2017. Personally reviewed Echo for her. She had highlighted sections. Reassured her. 2. Intermittent chest pain/claudication-this was discussed with Roderic Palau, claudication symptoms seem to greatly limit her activities she states. 3. Hypertension-usually her blood pressure is fairly well controlled. No changes made to her regimen today. She was anxious. Sometimes they have been elevated at home. 4.  headaches- left-sided upper head pain. She had benign workup  5. Anxiety-continue to work with primary physician. 6. Tobacco use-cessation encouraged. She understands risks involved. 7. Two-month follow-up with Nell Range, PA  Signed, Candee Furbish, MD [facility]  01/22/2016 2:54 PM

## 2016-01-22 NOTE — Patient Instructions (Signed)
Medication Instructions:  Start Eliquis 5 mg twice a day. Continue all other medications as listed.  Follow-Up: Follow up in 2 months with Bonney Leitz, PA.  If you need a refill on your cardiac medications before your next appointment, please call your pharmacy.  Thank you for choosing [facility]!!

## 2016-01-24 ENCOUNTER — Ambulatory Visit: Payer: Medicare Other | Admitting: Psychology

## 2016-01-26 ENCOUNTER — Ambulatory Visit (INDEPENDENT_AMBULATORY_CARE_PROVIDER_SITE_OTHER): Payer: Medicare Other | Admitting: Psychology

## 2016-01-26 DIAGNOSIS — F320 Major depressive disorder, single episode, mild: Secondary | ICD-10-CM

## 2016-02-09 ENCOUNTER — Ambulatory Visit (INDEPENDENT_AMBULATORY_CARE_PROVIDER_SITE_OTHER): Payer: Medicare Other | Admitting: Psychology

## 2016-02-09 DIAGNOSIS — F320 Major depressive disorder, single episode, mild: Secondary | ICD-10-CM | POA: Diagnosis not present

## 2016-02-28 ENCOUNTER — Emergency Department (HOSPITAL_COMMUNITY): Payer: Medicare Other

## 2016-02-28 ENCOUNTER — Encounter (HOSPITAL_COMMUNITY): Payer: Self-pay | Admitting: Emergency Medicine

## 2016-02-28 ENCOUNTER — Emergency Department (HOSPITAL_COMMUNITY)
Admission: EM | Admit: 2016-02-28 | Discharge: 2016-02-28 | Disposition: A | Discharge status: Home / Self Care | Payer: Medicare Other | Attending: Emergency Medicine | Admitting: Emergency Medicine

## 2016-02-28 DIAGNOSIS — J45909 Unspecified asthma, uncomplicated: Secondary | ICD-10-CM | POA: Diagnosis not present

## 2016-02-28 DIAGNOSIS — Z853 Personal history of malignant neoplasm of breast: Secondary | ICD-10-CM | POA: Diagnosis not present

## 2016-02-28 DIAGNOSIS — E039 Hypothyroidism, unspecified: Secondary | ICD-10-CM | POA: Insufficient documentation

## 2016-02-28 DIAGNOSIS — Z7901 Long term (current) use of anticoagulants: Secondary | ICD-10-CM | POA: Diagnosis not present

## 2016-02-28 DIAGNOSIS — I10 Essential (primary) hypertension: Secondary | ICD-10-CM | POA: Insufficient documentation

## 2016-02-28 DIAGNOSIS — R42 Dizziness and giddiness: Secondary | ICD-10-CM | POA: Diagnosis not present

## 2016-02-28 DIAGNOSIS — F17210 Nicotine dependence, cigarettes, uncomplicated: Secondary | ICD-10-CM | POA: Insufficient documentation

## 2016-02-28 LAB — CBC WITH DIFFERENTIAL/PLATELET
BASOS PCT: 0 %
Basophils Absolute: 0 10*3/uL (ref 0.0–0.1)
EOS ABS: 0.2 10*3/uL (ref 0.0–0.7)
Eosinophils Relative: 1 %
HCT: 42.8 % (ref 36.0–46.0)
Hemoglobin: 15.2 g/dL — ABNORMAL HIGH (ref 12.0–15.0)
LYMPHS ABS: 3.7 10*3/uL (ref 0.7–4.0)
Lymphocytes Relative: 30 %
MCH: 33.3 pg (ref 26.0–34.0)
MCHC: 35.5 g/dL (ref 30.0–36.0)
MCV: 93.9 fL (ref 78.0–100.0)
MONO ABS: 0.8 10*3/uL (ref 0.1–1.0)
MONOS PCT: 6 %
Neutro Abs: 7.8 10*3/uL — ABNORMAL HIGH (ref 1.7–7.7)
Neutrophils Relative %: 63 %
Platelets: 304 10*3/uL (ref 150–400)
RBC: 4.56 MIL/uL (ref 3.87–5.11)
RDW: 12.5 % (ref 11.5–15.5)
WBC: 12.4 10*3/uL — ABNORMAL HIGH (ref 4.0–10.5)

## 2016-02-28 LAB — BASIC METABOLIC PANEL
Anion gap: 6 (ref 5–15)
BUN: 12 mg/dL (ref 6–20)
CALCIUM: 9.3 mg/dL (ref 8.9–10.3)
CO2: 28 mmol/L (ref 22–32)
CREATININE: 0.65 mg/dL (ref 0.44–1.00)
Chloride: 107 mmol/L (ref 101–111)
GFR calc Af Amer: >60 mL/min (ref >60)
GFR calc non Af Amer: >60 mL/min (ref >60)
Glucose, Bld: 109 mg/dL — ABNORMAL HIGH (ref 65–99)
Potassium: 3.9 mmol/L (ref 3.5–5.1)
SODIUM: 141 mmol/L (ref 135–145)

## 2016-02-28 MED ORDER — DIAZEPAM 5 MG PO TABS
5.0000 mg | ORAL_TABLET | Freq: Once | ORAL | Status: AC
  Administered 2016-02-28: 5 mg via ORAL
  Filled 2016-02-28 (×2): qty 1

## 2016-02-28 MED ORDER — SODIUM CHLORIDE 0.9 % IV BOLUS (SEPSIS)
1000.0000 mL | Freq: Once | INTRAVENOUS | Status: AC
  Administered 2016-02-28: 1000 mL via INTRAVENOUS

## 2016-02-28 MED ORDER — MECLIZINE HCL 25 MG PO TABS
25.0000 mg | ORAL_TABLET | Freq: Once | ORAL | Status: AC
  Administered 2016-02-28: 25 mg via ORAL
  Filled 2016-02-28: qty 1

## 2016-02-28 MED ORDER — MECLIZINE HCL 25 MG PO TABS: 25.0000 mg | ORAL_TABLET | Freq: Three times a day (TID) | ORAL | 0 refills | Status: DC | PRN

## 2016-02-28 MED ORDER — DIAZEPAM 5 MG PO TABS: 5.0000 mg | ORAL_TABLET | Freq: Three times a day (TID) | ORAL | 0 refills | Status: DC | PRN

## 2016-02-28 NOTE — ED Provider Notes (Signed)
10:24 AM Patient feels much better this time.  MRI without acute stroke.  Home with Antivert and Valium.  Primary care follow-up.  This is likely a peripheral vestibular issue.  Patient understands return to the ER for new or worsening symptoms  Mr Brain Wo Contrast  Result Date: 02/28/2016 CLINICAL DATA:  Vertigo beginning last night, positional. Symptoms currently improved. EXAM: MRI HEAD WITHOUT CONTRAST TECHNIQUE: Multiplanar, multiecho pulse sequences of the brain and surrounding structures were obtained without intravenous contrast. COMPARISON:  03/14/2014 FINDINGS: Brain: No acute infarction, hemorrhage, hydrocephalus, extra-axial collection or mass lesion. Unremarkable appearance of the brain for age. Vascular: Preserved flow voids. 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 Skull and upper cervical spine: Negative Sinuses/Orbits: Complete opacification of the right maxillary sinus with mucosal thickening and central inspissated secretions. There is right mastoid opacification without restricted diffusion or nasopharyngeal finding. IMPRESSION: 1. No acute finding, including infarct. 2. Right mastoid opacification with negative nasopharynx. 3. Chronic right maxillary sinusitis/ obstruction. Electronically Signed   By: Monte Fantasia M.D.   On: 02/28/2016 09:04      Jola Schmidt, MD 02/28/16 1024

## 2016-02-28 NOTE — ED Provider Notes (Signed)
[facility] DEPT Provider Note   CSN: [id number] Arrival date & time: 02/28/16  [id number]     History   Chief Complaint Chief Complaint  Patient presents with  . Dizziness    HPI Bridget Mcdonald is a 71 y.o. female.  HPI  This is a 71 year old female with a history of hypertension, hyperlipidemia, reported atrial fibrillation not on anti-anticoagulants who presents with dizziness. Patient reports that she went to bed last night. She noted room spinning dizziness when she rolled over in bed. She states that she also noted flash frozen her vision. The dizziness worsens with position changes. She states that she felt like her eyes were beating to one side. Symptoms improved slightly but then worsened. She states that she had difficulty walking because of the dizziness. She states now that she has gotten here symptoms are largely gone. She denies any headache, weakness, numbness, tingling. She reports a history of vestibular neuritis which improved with acyclovir. Symptoms were somewhat similar. She denies recent URI symptoms but does report fullness in the right ear.  Past Medical History:  Diagnosis Date  . Acid reflux   . Allergy history unknown   . Asthma, mild intermittent   . Barrett's esophagus    [place]  . Cancer ([place])    skin, breast (left)  . Chronic headaches   . Cyst of left kidney    mult-locular Dr.Dahlstedt  . Hematuria    Dr. Zannie Cove  . High cholesterol   . History of cardiac arrhythmia   . History of colon polyps    04/2009 [place] unknown histology  . Hyperlipidemia   . Hypertension   . Hypothyroidism   . Hypotonia    [name] 09/2012  . IBS (irritable bowel syndrome)   . Incontinence, feces    09/2012  . Migraine headache   . Osteopenia   . Palpitations 12/01/2012   Echocardiogram - 7/13-normal EF, trace valvular lesions, mildly AI  . PVC's (premature ventricular contractions)   . Vestibular neuronitis    herpetic  . 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 Craniofacial pain 06/25/2014  . Occipital neuralgia of left side 05/23/2014  . Weakness of left hand 05/23/2014  . Weakness of left arm 05/23/2014  . Radiculopathy of cervical region 05/23/2014  . Essential hypertension 12/01/2013  . Ear noise/buzzing 12/01/2013  . Palpitations 12/01/2012  . COPD GOLD I 01/27/2012  . Smoker 09/19/2011  . Cough 09/18/2011    Past Surgical History:  Procedure Laterality Date  . APPENDECTOMY  1964  . BREAST LUMPECTOMY  2005?    left  . COLONOSCOPY  04/2009  . OOPHORECTOMY  1968?  Marland Kitchen SKIN SURGERY  08/2012  . TONSILECTOMY, ADENOIDECTOMY, BILATERAL MYRINGOTOMY AND TUBES  1961  . TUBAL LIGATION      OB History    No data available       Home Medications    Prior to Admission medications   Medication Sig Start Date End Date Taking? Authorizing Provider  alendronate (FOSAMAX) 70 MG tablet Take 70 mg by mouth once a week. Take with a full glass of water on an empty stomach.   Yes Historical Provider, MD  atorvastatin (LIPITOR) 10 MG tablet Take 10 mg by mouth daily.   Yes Historical Provider, MD  calcium carbonate (OS-CAL - DOSED IN MG OF ELEMENTAL CALCIUM) 1250 (500 Ca) MG tablet Take 1 tablet by mouth daily  with breakfast.   Yes Historical Provider, MD  CARTIA XT 240 MG 24 hr capsule TAKE 1 CAPSULE (240 MG TOTAL) BY MOUTH DAILY. 12/18/15  Yes Jerline Pain, MD  diltiazem (CARDIZEM) 60 MG tablet Take 1 tablet (60 mg total) by mouth every 6 (six) hours as needed. 12/13/14  Yes Jerline Pain, MD  hyoscyamine (NULEV) 0.125 MG TBDP disintergrating tablet Place 0.125 mg under the tongue every 4 (four) hours as needed (GI spasms).    Yes Historical Provider, MD  levothyroxine (SYNTHROID, LEVOTHROID) 175 MCG tablet Take 175 mcg by mouth daily. 09/08/13  Yes Historical Provider, MD  Multiple Vitamin (MULTIVITAMIN) tablet Take 1 tablet by mouth daily. Nature's way   Yes  Historical Provider, MD  vitamin C (ASCORBIC ACID) 500 MG tablet Take 500 mg by mouth daily.   Yes Historical Provider, MD  apixaban (ELIQUIS) 5 MG TABS tablet Take 1 tablet (5 mg total) by mouth 2 (two) times daily. 01/22/16   Jerline Pain, MD    Family History Family History  Problem Relation Age of Onset  . Emphysema Mother   . Cancer Brother     prostate  . Cancer Father     prostate  . Heart disease Paternal Grandfather   . Bladder Cancer      Social History Social History  Substance Use Topics  . Smoking status: Current Every Day Smoker    Packs/day: 1.50    Years: 50.00    Types: Cigarettes  . Smokeless tobacco: Never Used  . Alcohol use Yes     Comment: rare     Allergies   Demerol [meperidine]; Epinephrine; Gabapentin; and Quinine derivatives   Review of Systems Review of Systems  Constitutional: Negative for fever.  Respiratory: Negative for shortness of breath.   Cardiovascular: Negative for chest pain.  Gastrointestinal: Negative for abdominal pain.  Neurological: Positive for dizziness. Negative for seizures, weakness, light-headedness and headaches.  All other systems reviewed and are negative.    Physical Exam Updated Vital Signs BP 162/76 (BP Location: Right Arm)   Pulse 93   Temp 98 F (36.7 C) (Oral)   Resp 16   Ht 5\' 6"  (1.676 m)   Wt 142 lb (64.4 kg)   LMP  (LMP Unknown)   SpO2 95%   BMI 22.92 kg/m   Physical Exam  Constitutional: She is oriented to person, place, and time. She appears well-developed and well-nourished.  HENT:  Head: Normocephalic and atraumatic.  Small effusion behind right ear, no significant erythema or bulging  Eyes: Pupils are equal, round, and reactive to light.  Known nystagmus was noted, extraocular movements intact  Neck: Neck supple.  Cardiovascular: Normal rate, regular rhythm and normal heart sounds.   No murmur heard. Pulmonary/Chest: Effort normal and breath sounds normal. No respiratory distress.  She has no wheezes.  Abdominal: Soft. Bowel sounds are normal.  Neurological: She is alert and oriented to person, place, and time.  Cranial nerves II through XII intact, 5 out of 5 strength in all 4 extremities, no dysmetria to finger-nose-finger, steady gait with ambulation  Skin: Skin is warm and dry.  Psychiatric: She has a normal mood and affect.  Nursing note and vitals reviewed.    ED Treatments / Results  Labs (all labs ordered are listed, but only abnormal results are displayed) Labs Reviewed  CBC WITH DIFFERENTIAL/PLATELET - Abnormal; Notable for the following:       Result Value   WBC 12.4 (*)  Hemoglobin 15.2 (*)    Neutro Abs 7.8 (*)    All other components within normal limits  BASIC METABOLIC PANEL - Abnormal; Notable for the following:    Glucose, Bld 109 (*)    All other components within normal limits    EKG  EKG Interpretation  Date/Time:  Wednesday February 28 2016 04:04:17 EST Ventricular Rate:  97 PR Interval:    QRS Duration: 142 QT Interval:  418 QTC Calculation: 531 R Axis:   -91 Text Interpretation:  Sinus rhythm Atrial premature complex Nonspecific IVCD with LAD Baseline wander in lead(s) III Confirmed by Arwin Bisceglia  MD, Loma Sousa ([id number]) on 02/28/2016 5:59:56 AM       Radiology No results found.  Procedures Procedures (including critical care time)  Medications Ordered in ED Medications  diazepam (VALIUM) tablet 5 mg (5 mg Oral Refused 02/28/16 0514)  sodium chloride 0.9 % bolus 1,000 mL (not administered)  meclizine (ANTIVERT) tablet 25 mg (25 mg Oral Given 02/28/16 0514)     Initial Impression / Assessment and Plan / ED Course  I have reviewed the triage vital signs and the nursing notes.  Pertinent labs & imaging results that were available during my care of the patient were reviewed by me and considered in my medical decision making (see chart for details).    Patient presents with room spinning dizziness. Features are most  suspicious for peripheral vertigo; however, she reports a history of atrial fibrillation not on anticoagulants which increases her stroke with. She is currently neurologically intact. Symptoms have improved. Lab work obtained and patient was given meclizine and Valium. She initially declined the Valium; however, after ambulating to the bathroom, patient states that she had another severe episode of room spinning dizziness and now feels worse. While my suspicion is high for peripheral etiology, given her risk factors, will obtain MRI to rule out stroke.  Final Clinical Impressions(s) / ED Diagnoses   Final diagnoses:  None    New Prescriptions New Prescriptions   No medications on file     Merryl Hacker, MD 02/28/16 [phone]

## 2016-02-28 NOTE — ED Notes (Signed)
Feels dizzy

## 2016-02-28 NOTE — ED Triage Notes (Signed)
Pt re[ports episode of dizziness follow bending over followed by broken glass flashes of light.lasting less than a minute and had the sensation of falling. Repeated dizziness with bending

## 2016-03-07 ENCOUNTER — Ambulatory Visit (INDEPENDENT_AMBULATORY_CARE_PROVIDER_SITE_OTHER): Payer: Medicare Other | Admitting: Psychology

## 2016-03-07 DIAGNOSIS — F329 Major depressive disorder, single episode, unspecified: Secondary | ICD-10-CM | POA: Diagnosis not present

## 2016-03-14 ENCOUNTER — Telehealth: Payer: Self-pay | Admitting: Physician Assistant

## 2016-03-14 NOTE — Telephone Encounter (Signed)
New message      Talk to Bridget Mcdonald about if she needs to continue eliquis

## 2016-03-14 NOTE — Telephone Encounter (Signed)
Pt wanted to make sure she needed to keep appt as scheduled with Bonney Leitz, PA next week.  Advised she needs to keep appt to f/u palps/chest pain/leg pain etc.

## 2016-03-19 ENCOUNTER — Ambulatory Visit (INDEPENDENT_AMBULATORY_CARE_PROVIDER_SITE_OTHER): Payer: Medicare Other | Admitting: Psychology

## 2016-03-19 DIAGNOSIS — F419 Anxiety disorder, unspecified: Secondary | ICD-10-CM | POA: Diagnosis not present

## 2016-03-20 NOTE — Progress Notes (Deleted)
Cardiology Office Note    Date:  03/20/2016   ID:  Bridget Mcdonald, DOB May 29, 1945, MRN [id number]  PCP:  Aretta Nip, MD  Cardiologist:  Dr. Marlou Porch  CC: follow up   History of Present Illness:  Bridget Mcdonald is a 71 y.o. female with a history of anxiety, HTN, HLD, PVCs, tobacco abuse, hypothyroidism, PVD and recently diagnosed PAF (by monitor) who presents to clinic for follow up.  She is a retired Marine scientist. She recently saw Dr. Marlou Porch and was c/o of increase in palpitations. Monitor was placed and lifewatch reported an auto trigger of 90 secs of afib with RVR 150 bpm. This was confirmed with Dr. Caryl Comes and she was sent to the afib clinic to discuss anticoagulation. Discussed lifestyle risk factors with the pt and she was found to have risk factors for increased afib burden with tobacco abuse, high caffeine intake, possible sleep apnea(states does not sleep long enough to have a sleep study), no regular exercise. Denies alcohol use. She has a CHA2DS2VAScof at least 4(htn, age , female, PVD). Pt does describe intermittent h/o passing blood clots with mucus from rectum and has been w/u with Dr. Cristina Gong in the past without any definite bleeding source.   Saw Roderic Palau NP in the afib clinic. She recommended oral anticoagulation. She was hesitant. Repeat 2D ECHO 03/06/16 showed normal LV function, G1DD, AV sclerosis w/o stenosis.   Dr Marlou Porch saw her back in clinic on 01/22/16. He talked to Dr. Cristina Gong who cleared her for [facility]. She was started on Eliquis 5mg  BID. She discussed intermittent chest pain and claudication with Dr. Marlou Porch who did not recommend any further work up.   Today she presents to clinic for follow up.     Past Medical History:  Diagnosis Date  . Acid reflux   . Allergy history unknown   . Asthma, mild intermittent   . Barrett's esophagus    [place]  . Cancer ([place])    skin, breast (left)  . Chronic headaches   . Cyst of left kidney    mult-locular  Dr.Dahlstedt  . Hematuria    Dr. Zannie Cove  . High cholesterol   . History of cardiac arrhythmia   . History of colon polyps    04/2009 [place] unknown histology  . Hyperlipidemia   . Hypertension   . Hypothyroidism   . Hypotonia    [name] 09/2012  . IBS (irritable bowel syndrome)   . Incontinence, feces    09/2012  . Migraine headache   . Osteopenia   . Palpitations 12/01/2012   Echocardiogram - 7/13-normal EF, trace valvular lesions, mildly AI  . PVC's (premature ventricular contractions)   . Vestibular neuronitis    herpetic  . Vocal cord polyps    sees DR. Cristina Gong    Past Surgical History:  Procedure Laterality Date  . APPENDECTOMY  1964  . BREAST LUMPECTOMY  2005?    left  . COLONOSCOPY  04/2009  . OOPHORECTOMY  1968?  Marland Kitchen SKIN SURGERY  08/2012  . TONSILECTOMY, ADENOIDECTOMY, BILATERAL MYRINGOTOMY AND TUBES  1961  . TUBAL LIGATION      Current Medications: Outpatient Medications Prior to Visit  Medication Sig Dispense Refill  . alendronate (FOSAMAX) 70 MG tablet Take 70 mg by mouth once a week. Take with a full glass of water on an empty stomach.    Marland Kitchen apixaban (ELIQUIS) 5 MG TABS tablet Take 1 tablet (5 mg total) by mouth 2 (two) times daily. [place]  tablet 6  . atorvastatin (LIPITOR) 10 MG tablet Take 10 mg by mouth daily.    . calcium carbonate (OS-CAL - DOSED IN MG OF ELEMENTAL CALCIUM) 1250 (500 Ca) MG tablet Take 1 tablet by mouth daily with breakfast.    . CARTIA XT 240 MG 24 hr capsule TAKE 1 CAPSULE (240 MG TOTAL) BY MOUTH DAILY. 90 capsule 3  . diazepam (VALIUM) 5 MG tablet Take 1 tablet (5 mg total) by mouth every 8 (eight) hours as needed (dizziness). 12 tablet 0  . diltiazem (CARDIZEM) 60 MG tablet Take 1 tablet (60 mg total) by mouth every 6 (six) hours as needed. 90 tablet 3  . hyoscyamine (NULEV) 0.125 MG TBDP disintergrating tablet Place 0.125 mg under the tongue every 4 (four) hours as needed (GI spasms).     Marland Kitchen levothyroxine (SYNTHROID, LEVOTHROID)  175 MCG tablet Take 175 mcg by mouth daily.  0  . meclizine (ANTIVERT) 25 MG tablet Take 1 tablet (25 mg total) by mouth 3 (three) times daily as needed for dizziness. 15 tablet 0  . Multiple Vitamin (MULTIVITAMIN) tablet Take 1 tablet by mouth daily. Nature's way    . vitamin C (ASCORBIC ACID) 500 MG tablet Take 500 mg by mouth daily.     No facility-administered medications prior to visit.      Allergies:   Demerol [meperidine]; Epinephrine; Gabapentin; and Quinine derivatives   Social History   Social History  . Marital status: Single    Spouse name: N/A  . Number of children: 1  . Years of education: Post Grad   Occupational History  . Retired       Secretary/administrator   Social History Main Topics  . Smoking status: Current Every Day Smoker    Packs/day: 1.50    Years: 50.00    Types: Cigarettes  . Smokeless tobacco: Never Used  . Alcohol use Yes     Comment: rare  . Drug use: No  . Sexual activity: Not on file   Other Topics Concern  . Not on file   Social History Narrative   Lives at home with herself.   Caffeine use: 2 cups per day     Family History:  The patient's ***family history includes Cancer in her brother and father; Emphysema in her mother; Heart disease in her paternal grandfather.      *** ROS/PE    Wt Readings from Last 3 Encounters:  02/28/16 142 lb (64.4 kg)  01/22/16 142 lb 6.4 oz (64.6 kg)  12/18/15 142 lb 3.2 oz (64.5 kg)      Studies/Labs Reviewed:   EKG:  EKG is*** ordered today.  The ekg ordered today demonstrates ***  Recent Labs: 04/25/2015: ALT 15 02/28/2016: BUN 12; Creatinine, Ser 0.65; Hemoglobin 15.2; Platelets 304; Potassium 3.9; Sodium 141   Lipid Panel No results found for: CHOL, TRIG, HDL, CHOLHDL, VLDL, LDLCALC, LDLDIRECT  Additional studies/ records that were reviewed today include:  2D ECHO: 01/01/2016 LV EF: 65% -   70% Study Conclusions - Left ventricle: The cavity size was normal. Wall thickness was   normal.  Systolic function was vigorous. The estimated ejection   fraction was in the range of 65% to 70%. Wall motion was normal;   there were no regional wall motion abnormalities. Doppler   parameters are consistent with abnormal left ventricular   relaxation (grade 1 diastolic dysfunction). The E/e&' ratio is   between 8-15, suggesting indeterminate LV filling pressure. - Aortic valve: Sclerosis without stenosis.  There was trivial   regurgitation. - Left atrium: The atrium was normal in size. - Right atrium: The atrium was normal in size. - Inferior vena cava: The vessel was normal in size. The   respirophasic diameter changes were in the normal range (>= 50%),   consistent with normal central venous pressure. Impressions: - LVEF 65-70%, normal wall thickness and motion, diastolic   dysfunction, indeterminate LV filling pressure, aortic valve   sclerosis with trivial AI, normal biatrial size, normal IVC.   ASSESSMENT & PLAN:   PAF:  Intermittent chest pain/claudication:  HTN:  Tobacco abuse:  HLD:   Medication Adjustments/Labs and Tests Ordered: Current medicines are reviewed at length with the patient today.  Concerns regarding medicines are outlined above.  Medication changes, Labs and Tests ordered today are listed in the Patient Instructions below. There are no Patient Instructions on file for this visit.   Signed, Angelena Form, PA-C  03/20/2016 [facility] [place], [place], [place]  [address] Phone: [phone]; Fax: [phone]

## 2016-03-21 ENCOUNTER — Ambulatory Visit: Payer: Self-pay | Admitting: Physician Assistant

## 2016-04-02 ENCOUNTER — Ambulatory Visit (INDEPENDENT_AMBULATORY_CARE_PROVIDER_SITE_OTHER): Payer: Medicare Other | Admitting: Psychology

## 2016-04-02 DIAGNOSIS — F419 Anxiety disorder, unspecified: Secondary | ICD-10-CM

## 2016-04-11 NOTE — Progress Notes (Signed)
Cardiology Office Note    Date:  04/13/2016   ID:  Bridget Mcdonald, DOB September 04, 1945, MRN [id number]  PCP:  Aretta Nip, MD  Cardiologist:  Dr. Marlou Porch  CC: follow up   History of Present Illness:  Bridget Mcdonald is a 72 y.o. female with a history of anxiety, HTN, HLD, PVCs, tobacco abuse, hypothyroidism, PVD and recently diagnosed PAF (by monitor) who presents to clinic for follow up.  She is a retired Marine scientist. She recently saw Dr. Marlou Porch and was c/o of increase in palpitations. Monitor was placed and lifewatch reported an auto trigger of 90 secs of afib with RVR 150 bpm. This was confirmed with Dr. Caryl Comes and she was sent to the afib clinic to discuss anticoagulation. Discussed lifestyle risk factors with the pt and she was found to have risk factors for increased afib burden with tobacco abuse, high caffeine intake, possible sleep apnea (states does not sleep long enough to have a sleep study), no regular exercise. Denies alcohol use. She has a CHA2DS2VAScof at least 4 (htn, age , female, PVD). Pt does describe intermittent h/o passing blood clots with mucus from rectum and has been w/u with Dr. Cristina Gong in the past without any definite bleeding source.  She saw Roderic Palau NP in the afib clinic. She recommended oral anticoagulation. She was hesitant. Repeat 2D ECHO 03/06/16 showed normal LV function, G1DD, AV sclerosis w/o stenosis.   Dr Marlou Porch saw her back in clinic on 01/22/16. He talked to Dr. Cristina Gong who cleared her for [facility]. She was started on Eliquis 5mg  BID. She discussed intermittent chest pain and claudication with Dr. Marlou Porch who did not recommend any further work up.   Today she presents to clinic for follow up. She has never filled the Eliquis because it was too expensive. It was $350 a month. She worked with patient assistance and does not qualify for aid. She is very worried about the sides effects of Eliquis including bleeding and bruising. She brings in a list of  concerns. She is wondering why she gets swelling in her legs at night and her toes get swollen and she gets toe pain. It clears up by AM. Her tips of toes get numb. She does get pain in her legs with walking that eases of with rest. Mostly aching. Her biggest concern is CHF and why no one has addressed this. She feels like everyone brushes over her real issues and focuses only on the afib. She is still smoking a lot: 1.5-2PPD.     Past Medical History:  Diagnosis Date  . Acid reflux   . Allergy history unknown   . Asthma, mild intermittent   . Barrett's esophagus    [place]  . Cancer ([place])    skin, breast (left)  . Chronic headaches   . Cyst of left kidney    mult-locular Dr.Dahlstedt  . Hematuria    Dr. Zannie Cove  . High cholesterol   . History of cardiac arrhythmia   . History of colon polyps    04/2009 [place] unknown histology  . Hyperlipidemia   . Hypertension   . Hypothyroidism   . Hypotonia    Bridget Mcdonald 09/2012  . IBS (irritable bowel syndrome)   . Incontinence, feces    09/2012  . Migraine headache   . Osteopenia   . Palpitations 12/01/2012   Echocardiogram - 7/13-normal EF, trace valvular lesions, mildly AI  . PVC's (premature ventricular contractions)   . Vestibular neuronitis  herpetic  . Vocal cord polyps    sees DR. Cristina Gong    Past Surgical History:  Procedure Laterality Date  . APPENDECTOMY  1964  . BREAST LUMPECTOMY  2005?    left  . COLONOSCOPY  04/2009  . OOPHORECTOMY  1968?  Marland Kitchen SKIN SURGERY  08/2012  . TONSILECTOMY, ADENOIDECTOMY, BILATERAL MYRINGOTOMY AND TUBES  1961  . TUBAL LIGATION      Current Medications: Outpatient Medications Prior to Visit  Medication Sig Dispense Refill  . alendronate (FOSAMAX) 70 MG tablet Take 70 mg by mouth once a week. Take with a full glass of water on an empty stomach.    Marland Kitchen atorvastatin (LIPITOR) 10 MG tablet Take 10 mg by mouth daily.    . calcium carbonate (OS-CAL - DOSED IN MG OF ELEMENTAL  CALCIUM) 1250 (500 Ca) MG tablet Take 1 tablet by mouth daily with breakfast.    . CARTIA XT 240 MG 24 hr capsule TAKE 1 CAPSULE (240 MG TOTAL) BY MOUTH DAILY. 90 capsule 3  . diltiazem (CARDIZEM) 60 MG tablet Take 1 tablet (60 mg total) by mouth every 6 (six) hours as needed. 90 tablet 3  . hyoscyamine (NULEV) 0.125 MG TBDP disintergrating tablet Place 0.125 mg under the tongue every 4 (four) hours as needed (GI spasms).     Marland Kitchen levothyroxine (SYNTHROID, LEVOTHROID) 175 MCG tablet Take 175 mcg by mouth daily.  0  . Multiple Vitamin (MULTIVITAMIN) tablet Take 1 tablet by mouth daily. Nature's way    . vitamin C (ASCORBIC ACID) 500 MG tablet Take 500 mg by mouth daily.    Marland Kitchen apixaban (ELIQUIS) 5 MG TABS tablet Take 1 tablet (5 mg total) by mouth 2 (two) times daily. 60 tablet 6  . diazepam (VALIUM) 5 MG tablet Take 1 tablet (5 mg total) by mouth every 8 (eight) hours as needed (dizziness). 12 tablet 0  . meclizine (ANTIVERT) 25 MG tablet Take 1 tablet (25 mg total) by mouth 3 (three) times daily as needed for dizziness. 15 tablet 0   No facility-administered medications prior to visit.      Allergies:   Demerol [meperidine]; Epinephrine; Gabapentin; and Quinine derivatives   Social History   Social History  . Marital status: Single    Spouse name: N/A  . Number of children: 1  . Years of education: Post Grad   Occupational History  . Retired       Secretary/administrator   Social History Main Topics  . Smoking status: Current Every Day Smoker    Packs/day: 1.50    Years: 50.00    Types: Cigarettes  . Smokeless tobacco: Never Used  . Alcohol use Yes     Comment: rare  . Drug use: No  . Sexual activity: Not Asked   Other Topics Concern  . None   Social History Narrative   Lives at home with herself.   Caffeine use: 2 cups per day     Family History:  The patient's family history includes Cancer in her brother and father; Emphysema in her mother; Heart disease in her paternal grandfather.        ROS:   Please see the history of present illness.    ROS All other systems reviewed and are negative.   PHYSICAL EXAM:   VS:  BP 132/60   Pulse 74   Ht 5' 6.5" (1.689 m)   Wt 146 lb 12.8 oz (66.6 kg)   LMP  (LMP Unknown)   BMI 23.34 kg/m  GEN: Well nourished, well developed, in no acute distress, smells heavily of smoke.  HEENT: normal  Neck: no JVD, carotid bruits, or masses Cardiac: RRR; no murmurs, rubs, or gallops,no edema  Respiratory:  clear to auscultation bilaterally, normal work of breathing GI: soft, nontender, nondistended, + BS MS: no deformity or atrophy  Skin: warm and dry, no rash Neuro:  Alert and Oriented x 3, Strength and sensation are intact Psych: euthymic mood, full affect    Wt Readings from Last 3 Encounters:  04/12/16 146 lb 12.8 oz (66.6 kg)  02/28/16 142 lb (64.4 kg)  01/22/16 142 lb 6.4 oz (64.6 kg)      Studies/Labs Reviewed:   EKG:  EKG is ordered today.  The ekg ordered today demonstrates NSR, HR 74  Recent Labs: 04/25/2015: ALT 15 02/28/2016: Hemoglobin 15.2; Platelets 304 04/12/2016: BUN 15; Creatinine, Ser 0.65; NT-Pro BNP 139; Potassium 4.1; Sodium 141   Lipid Panel No results found for: CHOL, TRIG, HDL, CHOLHDL, VLDL, LDLCALC, LDLDIRECT  Additional studies/ records that were reviewed today include:  2D ECHO: 01/01/2016 LV EF: 65% - 70% Study Conclusions - Left ventricle: The cavity size was normal. Wall thickness was normal. Systolic function was vigorous. The estimated ejection fraction was in the range of 65% to 70%. Wall motion was normal; there were no regional wall motion abnormalities. Doppler parameters are consistent with abnormal left ventricular relaxation (grade 1 diastolic dysfunction). The E/e&' ratio is between 8-15, suggesting indeterminate LV filling pressure. - Aortic valve: Sclerosis without stenosis. There was trivial regurgitation. - Left atrium: The atrium was normal in size. -  Right atrium: The atrium was normal in size. - Inferior vena cava: The vessel was normal in size. The respirophasic diameter changes were in the normal range (>= 50%), consistent with normal central venous pressure. Impressions: - LVEF 65-70%, normal wall thickness and motion, diastolic dysfunction, indeterminate LV filling pressure, aortic valve sclerosis with trivial AI, normal biatrial size, normal IVC.   ASSESSMENT & PLAN:   PAF: maintaining NSR today. Continue Cartia XT 240mg  daily and Cardizem 60 mg PRN. CHA2DS2VAScof at least 4 (htn, age , female, PVD). She said Eliquis is cost prohibitive and she does not qualify for assistance. She is also very worried about the side effects of Eliquis. She is not interested in coumadin. She understands the risk of CVA without anticoagulation.   Intermittent claudication: will check lower extremity dopplers   HTN: BP well controlled today   Tobacco abuse: still smoking 1.5-2 PPD  HLD: continue statin   LE edema at night: sounds like venous insufficiency. I recommended compression stockings and elevation. She is very worried this is CHF given diastolic dysfunction on echo. Will check a BNP   Medication Adjustments/Labs and Tests Ordered: Current medicines are reviewed at length with the patient today.  Concerns regarding medicines are outlined above.  Medication changes, Labs and Tests ordered today are listed in the Patient Instructions below. Patient Instructions  Medication Instructions:  Your physician recommends that you continue on your current medications as directed. Please refer to the Current Medication list given to you today.   Labwork: TODAY:  PRO BNP & BMET   Testing/Procedures: Your physician has requested that you have a lower extremity arterial exercise duplex. During this test, exercise and ultrasound are used to evaluate arterial blood flow in the legs. Allow one hour for this exam. There are no  restrictions or special instructions.   Follow-Up: Your physician recommends that you schedule a follow-up appointment  in: 3 MONTHS WITH DR. Marlou Porch   Any Other Special Instructions Will Be Listed Below (If Applicable).   If you need a refill on your cardiac medications before your next appointment, please call your pharmacy.      Mable Fill, PA-C  04/13/2016 11:38 AM    [facility] [place], [place], [place]  [address] Phone: [phone]; Fax: [phone]

## 2016-04-12 ENCOUNTER — Ambulatory Visit (INDEPENDENT_AMBULATORY_CARE_PROVIDER_SITE_OTHER): Payer: Medicare Other | Admitting: Physician Assistant

## 2016-04-12 ENCOUNTER — Encounter: Payer: Self-pay | Admitting: Physician Assistant

## 2016-04-12 VITALS — BP 132/60 | HR 74 | Ht 66.5 in | Wt 146.8 lb

## 2016-04-12 DIAGNOSIS — M79604 Pain in right leg: Secondary | ICD-10-CM | POA: Diagnosis not present

## 2016-04-12 DIAGNOSIS — Z720 Tobacco use: Secondary | ICD-10-CM

## 2016-04-12 DIAGNOSIS — R600 Localized edema: Secondary | ICD-10-CM | POA: Diagnosis not present

## 2016-04-12 DIAGNOSIS — I10 Essential (primary) hypertension: Secondary | ICD-10-CM

## 2016-04-12 DIAGNOSIS — I480 Paroxysmal atrial fibrillation: Secondary | ICD-10-CM

## 2016-04-12 DIAGNOSIS — M79605 Pain in left leg: Secondary | ICD-10-CM | POA: Diagnosis not present

## 2016-04-12 NOTE — Patient Instructions (Addendum)
Medication Instructions:  Your physician recommends that you continue on your current medications as directed. Please refer to the Current Medication list given to you today.   Labwork: TODAY:  PRO BNP & BMET   Testing/Procedures: Your physician has requested that you have a lower extremity arterial exercise duplex. During this test, exercise and ultrasound are used to evaluate arterial blood flow in the legs. Allow one hour for this exam. There are no restrictions or special instructions.   Follow-Up: Your physician recommends that you schedule a follow-up appointment in: 3 MONTHS WITH DR. Marlou Porch   Any Other Special Instructions Will Be Listed Below (If Applicable).   If you need a refill on your cardiac medications before your next appointment, please call your pharmacy.

## 2016-04-13 LAB — BASIC METABOLIC PANEL
BUN/Creatinine Ratio: 23 (ref 12–28)
BUN: 15 mg/dL (ref 8–27)
CO2: 27 mmol/L (ref 18–29)
CREATININE: 0.65 mg/dL (ref 0.57–1.00)
Calcium: 9.2 mg/dL (ref 8.7–10.3)
Chloride: 98 mmol/L (ref 96–106)
GFR calc non Af Amer: 90 mL/min/{1.73_m2} (ref >59)
GFR, EST AFRICAN AMERICAN: 103 mL/min/{1.73_m2} (ref >59)
Glucose: 123 mg/dL — ABNORMAL HIGH (ref 65–99)
POTASSIUM: 4.1 mmol/L (ref 3.5–5.2)
SODIUM: 141 mmol/L (ref 134–144)

## 2016-04-13 LAB — PRO B NATRIURETIC PEPTIDE: NT-Pro BNP: 139 pg/mL (ref 0–301)

## 2016-04-17 ENCOUNTER — Other Ambulatory Visit: Payer: Self-pay | Admitting: Physician Assistant

## 2016-04-17 DIAGNOSIS — I739 Peripheral vascular disease, unspecified: Secondary | ICD-10-CM

## 2016-04-17 DIAGNOSIS — M79604 Pain in right leg: Secondary | ICD-10-CM

## 2016-04-17 DIAGNOSIS — M79605 Pain in left leg: Principal | ICD-10-CM

## 2016-04-18 ENCOUNTER — Ambulatory Visit (INDEPENDENT_AMBULATORY_CARE_PROVIDER_SITE_OTHER): Payer: Medicare Other | Admitting: Psychology

## 2016-04-18 DIAGNOSIS — F419 Anxiety disorder, unspecified: Secondary | ICD-10-CM | POA: Diagnosis not present

## 2016-04-19 ENCOUNTER — Telehealth: Payer: Self-pay | Admitting: Cardiology

## 2016-04-19 NOTE — Telephone Encounter (Signed)
Follow Up:   Pt wants you to know she read her results on my-chart. If you still need to talk to her,please call.

## 2016-04-24 ENCOUNTER — Ambulatory Visit (INDEPENDENT_AMBULATORY_CARE_PROVIDER_SITE_OTHER): Payer: Medicare Other | Admitting: Neurology

## 2016-04-24 ENCOUNTER — Encounter: Payer: Self-pay | Admitting: Neurology

## 2016-04-24 VITALS — BP 149/75 | HR 82 | Ht 66.5 in | Wt 147.2 lb

## 2016-04-24 DIAGNOSIS — G250 Essential tremor: Secondary | ICD-10-CM | POA: Diagnosis not present

## 2016-04-24 DIAGNOSIS — J320 Chronic maxillary sinusitis: Secondary | ICD-10-CM

## 2016-04-24 NOTE — Progress Notes (Signed)
[facility]    Provider: Dr Jaynee Eagles Referring Provider: Aretta Nip, MD Primary Care Physician: Milagros Evener, MD  CC: headache  Interval history 04/25/2016: She has multiple complaints today again. She had an incident of vision changes and vertigo. As she rolled over in bed she had severe vertigo. She had a slash of really bright light. She was falling. She went to the ED. Discussed new MRI is stable and reviewed images with patient. MRI did however show chronic right maxillary sinusitis. The vertigo resolved. She has some mild dizziness but that is chronic. She has chronic neck and cervical muscular pain. She has low back pain. She has a sensitive spot paravertebral right thoracic T10 with burning which is improved. She has weakness in the left shoulder and arm. She has chronic left weakness. Her cognition is stable but still feels she has memory problems. Her handwriting is poor and she sometimes jerks. The writing drives her crazy.   Interval Update 01/30/2014; She is here for a new problem. She has memory issues. She can't remember names of people or places, reads word incorrectly, she has a hard time with people's names, she prefers to write as she needs time to think, worse with being nervous, she forgets what she is thinking about just walking from one room to another, she has a problem expressing herself, she is misplacing things, forgets things if she doesn't write them down  She types incorrectly and sometimes seem way slower moving mouse when playing game. She loses her train of thought. Slowly progressive. She has a hard time rmembering specifics from the past as well. She lives independently.    MRI HEAD FINDINGS 02/2014  No evidence for acute infarction, hemorrhage, mass lesion, hydrocephalus, or extra-axial fluid. Normal cerebral volume. Mild subcortical and periventricular T2 and FLAIR hyperintensities, likely chronic microvascular ischemic change.  Pituitary, pineal, and cerebellar tonsils unremarkable. No upper cervical lesions. Flow voids are maintained throughout the carotid, basilar, and vertebral arteries. There are no areas of chronic hemorrhage. Chronically opacified RIGHT maxillary sinus. Suspect right-sided infundibular narrowing. No expansion to suggest mucocele. RIGHT mastoid fluid, likely effusion. No nasopharyngeal mass. Negative orbits. Negative calvarium and scalp soft tissues.  MRA HEAD FINDINGS 02/2014  The internal carotid arteries are widely patent. The basilar artery is widely patent with both vertebrals contributing, greater on the RIGHT. There is no flow-limiting stenosis or intracranial aneurysm.  IMPRESSION: No acute intracranial abnormality. Mild small vessel disease.  Chronic RIGHT maxillary sinusitis.  Incidental RIGHT mastoid effusion of uncertain significance.  Negative MRA intracranial circulation.   Interval update 12/27/2014: She had 2 weird episodes. She was in the doctor's office and she had a strange sensation that her eye ball was being pulled down and out and she didn't lose vision but it wasn't registering, she could see and it only lasted instantly and felt drained. And the artery on the left temporal lobe gets bulbous and throbbing and painful. She has had normal crp/esr in the past, she declines that testing today. She has tenderness there in the temple area and a feeling of swishing warm feeling across her head and a stabbing pain in the inner ear. One time only. Went away. She gets congestion with her headaches. The occipital headaches were helped with physical therapy. 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 She is also having multiple other complaints and memory problems. We reviewed the MRi of her brain and cervical spine and reviewed the images together.  HPI: Bridget Mcdonald is a 71 y.o. female here as a referral from Dr. Radene Ou  for headaches. She has a stabbing headache in the left fronto-parietal area. Digging her nails into the skin helps. She is starting to get it on the right as well. Today she reports that she also has facial pain in the face (points to the trigeminal and supraorbital distribution). She has burning in the occipital area on the left. She has done a lot of research, she does not understand why she has these symptoms. She has had trigeminal neuralgia in the past and these symptoms are different. No rhinorrhea, lacrimation or injection with these symptoms. She does not want to try medication.   MRi of the cervical spine: This is an abnormal MRI of the cervical spine showing multilevel degenerative changes as detailed above. There are various combinations of uncovertebral spurring and disc bulging/protrusion at every cervical level. There is no severe foraminal narrowing and there does not appear to be any nerve root compression.   Addendum: Labs collected November 2015 include hemoglobin A1c of 5.6, normal TSH,   Labs collected August 2015 include normal CBC with differential, normal CMP, sedimentation rate 6, B12 677, LDL 87   Initial visit 05/23/2014 Bridget Mcdonald is a 71 y.o. female here as a referral from Dr. Radene Ou for headaches. Past medical history of hypertension, high cholesterol, anxiety, depression. Started December 12th. She has a history of trigeminal neuralgia. She has tenderness in the left sided scalp. She has pressure in the head. She has lightning, severe, sharp in the left parietal and occipital areas on the left. Then turns into a pressure headache all over the scalp with soething sitting on on the left frontal/paritel lobe. Headaches are every other day, they last for 5-10 minutes or all day long. Also behind the left ear. She has dizziness. She feels a fluid wave in the brain. She has photopsia. She has bilat tinnitus but no hearing loss and has had hearing checked. She gets  occipital headaches. Burning in the occipital area.  Also has numbness in the toes, worsening distally.   Reviewed notes, labs and imaging from outside physicians, which showed: Recently reviewed MRI of the brain and MRA of the head with patient including images. Some mild chronic microvascular ischemic changes nonspecific. Otherwise negative. MRA of the head without significant stenosis in the intracerebral arteries.   Social History   Social History  . Marital status: Single    Spouse name: N/A  . Number of children: 1  . Years of education: Post Grad   Occupational History  . Retired       Secretary/administrator   Social History Main Topics  . Smoking status: Current Every Day Smoker    Packs/day: 1.50    Years: 50.00    Types: Cigarettes  . Smokeless tobacco: Never Used  . Alcohol use Yes     Comment: rare  . Drug use: No  . Sexual activity: Not on file   Other Topics Concern  . Not on file   Social History Narrative   Lives at home with herself.   Caffeine use: 2 cups per day.   Right-handed    Family History  Problem Relation Age of Onset  . Emphysema Mother   . Cancer Father     prostate  . Heart disease Paternal Grandfather   . Cancer Brother     prostate  . Bladder Cancer      Past Medical History:  Diagnosis Date  .  Acid reflux   . Allergy history unknown   . Asthma, mild intermittent   . Barrett's esophagus    [place]  . Cancer ([place])    skin, breast (left)  . Chronic headaches   . Cyst of left kidney    mult-locular Dr.Dahlstedt  . Hematuria    Dr. Zannie Cove  . High cholesterol   . History of cardiac arrhythmia   . History of colon polyps    04/2009 [place] unknown histology  . Hyperlipidemia   . Hypertension   . Hypothyroidism   . Hypotonia    [name] 09/2012  . IBS (irritable bowel syndrome)   . Incontinence, feces    09/2012  . Migraine headache   . Osteopenia   . Palpitations 12/01/2012   Echocardiogram - 7/13-normal EF,  trace valvular lesions, mildly AI  . PVC's (premature ventricular contractions)   . Vertigo   . Vestibular neuronitis    herpetic  . Vocal cord polyps    sees DR. Cristina Gong    Past Surgical History:  Procedure Laterality Date  . APPENDECTOMY  1964  . BREAST LUMPECTOMY  2005?    left  . COLONOSCOPY  04/2009  . OOPHORECTOMY  1968?  Marland Kitchen SKIN SURGERY  08/2012  . TONSILECTOMY, ADENOIDECTOMY, BILATERAL MYRINGOTOMY AND TUBES  1961  . TUBAL LIGATION      Current Outpatient Prescriptions  Medication Sig Dispense Refill  . alendronate (FOSAMAX) 70 MG tablet Take 70 mg by mouth once a week. Take with a full glass of water on an empty stomach.    Marland Kitchen atorvastatin (LIPITOR) 10 MG tablet Take 10 mg by mouth daily.    Marland Kitchen CALCIUM-PHOSPHORUS PO Take by mouth daily.    Marland Kitchen CARTIA XT 240 MG 24 hr capsule TAKE 1 CAPSULE (240 MG TOTAL) BY MOUTH DAILY. 90 capsule 3  . diltiazem (CARDIZEM) 60 MG tablet Take 1 tablet (60 mg total) by mouth every 6 (six) hours as needed. 90 tablet 3  . hyoscyamine (NULEV) 0.125 MG TBDP disintergrating tablet Place 0.125 mg under the tongue every 4 (four) hours as needed (GI spasms).     Marland Kitchen levothyroxine (SYNTHROID, LEVOTHROID) 175 MCG tablet Take 175 mcg by mouth daily.  0  . Multiple Vitamin (MULTIVITAMIN) tablet Take 1 tablet by mouth daily.     . vitamin C (ASCORBIC ACID) 500 MG tablet Take 500 mg by mouth daily.     No current facility-administered medications for this visit.     Allergies as of 04/24/2016 - Review Complete 04/24/2016  Allergen Reaction Noted  . Demerol [meperidine]  09/18/2011  . Epinephrine  09/18/2011  . Gabapentin Diarrhea 04/25/2015  . Quinine derivatives  09/18/2011    Vitals: BP (!) 149/75   Pulse 82   Ht 5' 6.5" (1.689 m)   Wt 147 lb 3.2 oz (66.8 kg)   LMP  (LMP Unknown)   BMI 23.40 kg/m  Last Weight:  Wt Readings from Last 1 Encounters:  04/24/16 147 lb 3.2 oz (66.8 kg)   Last Height:   Ht Readings from Last 1 Encounters:  04/24/16  5' 6.5" (1.689 m)   Mild high frequency low ampliture postural and action tremor   MMSE - Mini Mental State Exam 04/24/2016 01/31/2015  Orientation to time 4 5  Orientation to Place 5 5  Registration 3 3  Attention/ Calculation 5 5  Recall 3 3  Language- name 2 objects 2 2  Language- repeat 1 1  Language- follow 3 step  command 3 3  Language- read & follow direction 1 1  Write a sentence 1 1  Copy design 1 1  Total score 29 30    Assessment/Plan:  71 year old female with multiple neurologic complaints. Neuro exam is largely unremarkable except for possible mild essential tremor. She still continues to complain of memory problems however no progression and testing has been normal. Cognitive complaints are likely multifactorial including normal cognitive aging depression and anxiety. Discussed essential tremor today. We'll refer to Dr. Ernesto Rutherford for chronic sinusitis seen on MRI in the right maxillary sinus and right ear pain.  Refer to Dr. Ernesto Rutherford for chronic sinusitis and ear pain (impressive chronic sinusitis in the right maxillary sinus on MRI brain). Essential tremor: monitor Cognition complaints: monitor  Cc: Milagros Evener, MD  Sarina Ill, MD  [facility] [address] [place], [place] [address]  Phone [phone] Fax [phone]  A total of 35 minutes was spent face-to-face with this patient. Over half this time was spent on counseling patient on the maxillary sinusitis and essential tremor diagnosis and different diagnostic and therapeutic options available.

## 2016-04-24 NOTE — Patient Instructions (Signed)
Remember to drink plenty of fluid, eat healthy meals and do not skip any meals. Try to eat protein with a every meal and eat a healthy snack such as fruit or nuts in between meals. Try to keep a regular sleep-wake schedule and try to exercise daily, particularly in the form of walking, 20-30 minutes a day, if you can.   I would like to see you back as needed, sooner if we need to. Please call us with any interim questions, concerns, problems, updates or refill requests.   Our phone number is [phone]. We also have an after hours call service for urgent matters and there is a physician on-call for urgent questions. For any emergencies you know to call 911 or go to the nearest emergency room  Essential Tremor A tremor is trembling or shaking that you cannot control. Most tremors affect the hands or arms. Tremors can also affect the head, vocal cords, face, and other parts of the body. Essential tremor is a tremor without a known cause. What are the causes? Essential tremor has no known cause. What increases the risk? You may be at greater risk of essential tremor if:  You have a family member with essential tremor.  You are age 71 or older.  You take certain medicines. What are the signs or symptoms? The main sign of a tremor is uncontrolled and unintentional rhythmic shaking of a body part.  You may have difficulty eating with a spoon or fork.  You may have difficulty writing.  You may nod your head up and down or side to side.  You may have a quivering voice. Your tremors:  May get worse over time.  May come and go.  May be more noticeable on one side of your body.  May get worse due to stress, fatigue, caffeine, and extreme heat or cold. How is this diagnosed? Your health care provider can diagnose essential tremor based on your symptoms, medical history, and a physical examination. There is no single test to diagnose an essential tremor. However, your health care provider  may perform a variety of tests to rule out other conditions. Tests may include:  Blood and urine tests.  Imaging studies of your brain, such as:  CT scan.  MRI.  A test that measures involuntary muscle movement (electromyogram). How is this treated? Your tremors may go away without treatment. Mild tremors may not need treatment if they do not affect your day-to-day life. Severe tremors may need to be treated using one or a combination of the following options:  Medicines. This may include medicine that is injected.  Lifestyle changes.  Physical therapy. Follow these instructions at home:  Take medicines only as directed by your health care provider.  Limit alcohol intake to no more than 1 drink per day for nonpregnant women and 2 drinks per day for men. One drink equals 12 oz of beer, 5 oz of wine, or 1 oz of hard liquor.  Do not use any tobacco products, including cigarettes, chewing tobacco, or electronic cigarettes. If you need help quitting, ask your health care provider.  Take medicines only as directed by your health care provider.  Avoid extreme heat or cold.  Limit the amount of caffeine you consumeas directed by your health care provider.  Try to get eight hours of sleep each night.  Find ways to manage your stress, such as meditation or yoga.  Keep all follow-up visits as directed by your health care provider. This is important.  This includes any physical therapy visits. Contact a health care provider if:  You experience any changes in the location or intensity of your tremors.  You start having a tremor after starting a new medicine.  You have tremor with other symptoms such as:  Numbness.  Tingling.  Pain.  Weakness.  Your tremor gets worse.  Your tremor interferes with your daily life. This information is not intended to replace advice given to you by your health care provider. Make sure you discuss any questions you have with your health care  provider. Document Released: 01/21/2014 Document Revised: 06/08/2015 Document Reviewed: 06/28/2013 Elsevier Interactive Patient Education  2017 [employer] Inc[employer]   Benign Positional Vertigo Vertigo is the feeling that you or your surroundings are moving when they are not. Benign positional vertigo is the most common form of vertigo. The cause of this condition is not serious (is benign). This condition is triggered by certain movements and positions (is positional). This condition can be dangerous if it occurs while you are doing something that could endanger you or others, such as driving. What are the causes? In many cases, the cause of this condition is not known. It may be caused by a disturbance in an area of the inner ear that helps your brain to sense movement and balance. This disturbance can be caused by a viral infection (labyrinthitis), head injury, or repetitive motion. What increases the risk? This condition is more likely to develop in:  Women.  People who are 71 years of age or older. What are the signs or symptoms? Symptoms of this condition usually happen when you move your head or your eyes in different directions. Symptoms may start suddenly, and they usually last for less than a minute. Symptoms may include:  Loss of balance and falling.  Feeling like you are spinning or moving.  Feeling like your surroundings are spinning or moving.  Nausea and vomiting.  Blurred vision.  Dizziness.  Involuntary eye movement (nystagmus). Symptoms can be mild and cause only slight annoyance, or they can be severe and interfere with daily life. Episodes of benign positional vertigo may return (recur) over time, and they may be triggered by certain movements. Symptoms may improve over time. How is this diagnosed? This condition is usually diagnosed by medical history and a physical exam of the head, neck, and ears. You may be referred to a health care provider who specializes in ear,  nose, and throat (ENT) problems (otolaryngologist) or a provider who specializes in disorders of the nervous system (neurologist). You may have additional testing, including:  MRI.  A CT scan.  Eye movement tests. Your health care provider may ask you to change positions quickly while he or she watches you for symptoms of benign positional vertigo, such as nystagmus. Eye movement may be tested with an electronystagmogram (ENG), caloric stimulation, the Dix-Hallpike test, or the roll test.  An electroencephalogram (EEG). This records electrical activity in your brain.  Hearing tests. How is this treated? Usually, your health care provider will treat this by moving your head in specific positions to adjust your inner ear back to normal. Surgery may be needed in severe cases, but this is rare. In some cases, benign positional vertigo may resolve on its own in 2-4 weeks. Follow these instructions at home: Safety   Move slowly.Avoid sudden body or head movements.  Avoid driving.  Avoid operating heavy machinery.  Avoid doing any tasks that would be dangerous to you or others if a vertigo  episode would occur.  If you have trouble walking or keeping your balance, try using a cane for stability. If you feel dizzy or unstable, sit down right away.  Return to your normal activities as told by your health care provider. Ask your health care provider what activities are safe for you. General instructions   Take over-the-counter and prescription medicines only as told by your health care provider.  Avoid certain positions or movements as told by your health care provider.  Drink enough fluid to keep your urine clear or pale yellow.  Keep all follow-up visits as told by your health care provider. This is important. Contact a health care provider if:  You have a fever.  Your condition gets worse or you develop new symptoms.  Your family or friends notice any behavioral changes.  Your  nausea or vomiting gets worse.  You have numbness or a "pins and needles" sensation. Get help right away if:  You have difficulty speaking or moving.  You are always dizzy.  You faint.  You develop severe headaches.  You have weakness in your legs or arms.  You have changes in your hearing or vision.  You develop a stiff neck.  You develop sensitivity to light. This information is not intended to replace advice given to you by your health care provider. Make sure you discuss any questions you have with your health care provider. Document Released: 10/08/2005 Document Revised: 06/08/2015 Document Reviewed: 04/25/2014 Elsevier Interactive Patient Education  2017 [employer].

## 2016-04-25 NOTE — Addendum Note (Signed)
Addended by: Sarina Ill B on: 04/25/2016 06:43 PM   Modules accepted: Orders

## 2016-05-02 ENCOUNTER — Ambulatory Visit (INDEPENDENT_AMBULATORY_CARE_PROVIDER_SITE_OTHER): Payer: Medicare Other | Admitting: Psychology

## 2016-05-02 DIAGNOSIS — F419 Anxiety disorder, unspecified: Secondary | ICD-10-CM | POA: Diagnosis not present

## 2016-05-03 ENCOUNTER — Encounter (HOSPITAL_COMMUNITY): Payer: Medicare Other

## 2016-05-07 DIAGNOSIS — J310 Chronic rhinitis: Secondary | ICD-10-CM | POA: Diagnosis not present

## 2016-05-07 DIAGNOSIS — H6523 Chronic serous otitis media, bilateral: Secondary | ICD-10-CM | POA: Diagnosis not present

## 2016-05-07 DIAGNOSIS — R42 Dizziness and giddiness: Secondary | ICD-10-CM | POA: Diagnosis not present

## 2016-05-08 DIAGNOSIS — N281 Cyst of kidney, acquired: Secondary | ICD-10-CM | POA: Diagnosis not present

## 2016-05-23 ENCOUNTER — Ambulatory Visit (HOSPITAL_COMMUNITY)
Admission: RE | Admit: 2016-05-23 | Discharge: 2016-05-23 | Disposition: A | Discharge status: Home / Self Care | Payer: Medicare Other | Source: Ambulatory Visit | Attending: Cardiovascular Disease | Admitting: Cardiovascular Disease

## 2016-05-23 DIAGNOSIS — Z720 Tobacco use: Secondary | ICD-10-CM | POA: Diagnosis not present

## 2016-05-23 DIAGNOSIS — I739 Peripheral vascular disease, unspecified: Secondary | ICD-10-CM | POA: Diagnosis not present

## 2016-05-23 DIAGNOSIS — I10 Essential (primary) hypertension: Secondary | ICD-10-CM | POA: Insufficient documentation

## 2016-05-23 DIAGNOSIS — M79605 Pain in left leg: Secondary | ICD-10-CM | POA: Insufficient documentation

## 2016-05-23 DIAGNOSIS — R600 Localized edema: Secondary | ICD-10-CM

## 2016-05-23 DIAGNOSIS — M79604 Pain in right leg: Secondary | ICD-10-CM | POA: Diagnosis not present

## 2016-05-23 DIAGNOSIS — E785 Hyperlipidemia, unspecified: Secondary | ICD-10-CM | POA: Diagnosis not present

## 2016-05-27 ENCOUNTER — Other Ambulatory Visit: Payer: Self-pay | Admitting: Physician Assistant

## 2016-05-27 ENCOUNTER — Other Ambulatory Visit: Payer: Self-pay | Admitting: *Deleted

## 2016-05-27 DIAGNOSIS — M79605 Pain in left leg: Principal | ICD-10-CM

## 2016-05-27 DIAGNOSIS — M79604 Pain in right leg: Secondary | ICD-10-CM

## 2016-05-28 ENCOUNTER — Other Ambulatory Visit: Payer: Self-pay | Admitting: Physician Assistant

## 2016-05-30 ENCOUNTER — Ambulatory Visit: Payer: Self-pay | Admitting: Psychology

## 2016-06-06 ENCOUNTER — Other Ambulatory Visit: Payer: Self-pay

## 2016-06-06 DIAGNOSIS — D492 Neoplasm of unspecified behavior of bone, soft tissue, and skin: Secondary | ICD-10-CM | POA: Diagnosis not present

## 2016-06-06 DIAGNOSIS — L570 Actinic keratosis: Secondary | ICD-10-CM | POA: Diagnosis not present

## 2016-06-18 ENCOUNTER — Encounter (HOSPITAL_COMMUNITY): Payer: Medicare Other

## 2016-06-21 ENCOUNTER — Encounter: Payer: Self-pay | Admitting: Cardiology

## 2016-07-04 ENCOUNTER — Ambulatory Visit (INDEPENDENT_AMBULATORY_CARE_PROVIDER_SITE_OTHER): Payer: Medicare Other | Admitting: Psychology

## 2016-07-04 DIAGNOSIS — F419 Anxiety disorder, unspecified: Secondary | ICD-10-CM | POA: Diagnosis not present

## 2016-07-12 ENCOUNTER — Ambulatory Visit: Payer: Self-pay | Admitting: Cardiology

## 2016-07-19 ENCOUNTER — Ambulatory Visit (HOSPITAL_COMMUNITY)
Admission: RE | Admit: 2016-07-19 | Discharge: 2016-07-19 | Disposition: A | Discharge status: Home / Self Care | Payer: Medicare Other | Source: Ambulatory Visit | Attending: Cardiovascular Disease | Admitting: Cardiovascular Disease

## 2016-07-19 DIAGNOSIS — I70203 Unspecified atherosclerosis of native arteries of extremities, bilateral legs: Secondary | ICD-10-CM | POA: Insufficient documentation

## 2016-07-19 DIAGNOSIS — I10 Essential (primary) hypertension: Secondary | ICD-10-CM | POA: Insufficient documentation

## 2016-07-19 DIAGNOSIS — I739 Peripheral vascular disease, unspecified: Secondary | ICD-10-CM | POA: Diagnosis not present

## 2016-07-19 DIAGNOSIS — Z720 Tobacco use: Secondary | ICD-10-CM | POA: Diagnosis not present

## 2016-07-19 DIAGNOSIS — E785 Hyperlipidemia, unspecified: Secondary | ICD-10-CM | POA: Insufficient documentation

## 2016-07-19 DIAGNOSIS — M79605 Pain in left leg: Secondary | ICD-10-CM | POA: Insufficient documentation

## 2016-07-19 DIAGNOSIS — M79604 Pain in right leg: Secondary | ICD-10-CM | POA: Insufficient documentation

## 2016-07-24 ENCOUNTER — Telehealth: Payer: Self-pay | Admitting: Physician Assistant

## 2016-07-24 NOTE — Telephone Encounter (Signed)
New Message     Pt is returning [facility] call for test results

## 2016-07-25 NOTE — Telephone Encounter (Signed)
-----   Message from Bridget Mcdonald, [place] sent at 07/22/2016  9:54 AM EDT ----- Dr. Gwenlyn Found has reviewed her Dopplers and wrote " There is mild diffuse peripheral arterial disease but nothing that looks obstructive or that would cause claudication."  Please let her know that no further work up is indicated.

## 2016-08-05 ENCOUNTER — Ambulatory Visit (INDEPENDENT_AMBULATORY_CARE_PROVIDER_SITE_OTHER): Payer: Medicare Other | Admitting: Psychology

## 2016-08-05 DIAGNOSIS — F419 Anxiety disorder, unspecified: Secondary | ICD-10-CM | POA: Diagnosis not present

## 2016-08-13 ENCOUNTER — Ambulatory Visit: Payer: Self-pay | Admitting: Physician Assistant

## 2016-09-12 ENCOUNTER — Ambulatory Visit (INDEPENDENT_AMBULATORY_CARE_PROVIDER_SITE_OTHER): Payer: Medicare Other | Admitting: Psychology

## 2016-09-12 DIAGNOSIS — F419 Anxiety disorder, unspecified: Secondary | ICD-10-CM | POA: Diagnosis not present

## 2016-09-17 ENCOUNTER — Ambulatory Visit: Payer: Self-pay | Admitting: Cardiology

## 2016-10-02 DIAGNOSIS — H8110 Benign paroxysmal vertigo, unspecified ear: Secondary | ICD-10-CM | POA: Diagnosis not present

## 2016-10-02 DIAGNOSIS — M810 Age-related osteoporosis without current pathological fracture: Secondary | ICD-10-CM | POA: Diagnosis not present

## 2016-10-02 DIAGNOSIS — I480 Paroxysmal atrial fibrillation: Secondary | ICD-10-CM | POA: Diagnosis not present

## 2016-10-02 DIAGNOSIS — E7800 Pure hypercholesterolemia, unspecified: Secondary | ICD-10-CM | POA: Diagnosis not present

## 2016-10-02 DIAGNOSIS — E039 Hypothyroidism, unspecified: Secondary | ICD-10-CM | POA: Diagnosis not present

## 2016-10-02 DIAGNOSIS — F418 Other specified anxiety disorders: Secondary | ICD-10-CM | POA: Diagnosis not present

## 2016-10-02 DIAGNOSIS — Z0000 Encounter for general adult medical examination without abnormal findings: Secondary | ICD-10-CM | POA: Diagnosis not present

## 2016-10-08 ENCOUNTER — Ambulatory Visit (INDEPENDENT_AMBULATORY_CARE_PROVIDER_SITE_OTHER): Payer: Medicare Other | Admitting: Psychology

## 2016-10-08 DIAGNOSIS — F419 Anxiety disorder, unspecified: Secondary | ICD-10-CM

## 2016-10-30 ENCOUNTER — Encounter: Payer: Self-pay | Admitting: Cardiology

## 2016-11-11 ENCOUNTER — Ambulatory Visit (INDEPENDENT_AMBULATORY_CARE_PROVIDER_SITE_OTHER): Payer: Medicare Other | Admitting: Psychology

## 2016-11-11 DIAGNOSIS — F419 Anxiety disorder, unspecified: Secondary | ICD-10-CM | POA: Diagnosis not present

## 2016-11-11 DIAGNOSIS — Z23 Encounter for immunization: Secondary | ICD-10-CM | POA: Diagnosis not present

## 2016-11-13 ENCOUNTER — Ambulatory Visit (INDEPENDENT_AMBULATORY_CARE_PROVIDER_SITE_OTHER): Payer: Medicare Other | Admitting: Cardiology

## 2016-11-13 ENCOUNTER — Encounter: Payer: Self-pay | Admitting: Cardiology

## 2016-11-13 VITALS — BP 130/72 | HR 99 | Ht 66.5 in | Wt 147.0 lb

## 2016-11-13 DIAGNOSIS — I10 Essential (primary) hypertension: Secondary | ICD-10-CM

## 2016-11-13 DIAGNOSIS — R002 Palpitations: Secondary | ICD-10-CM | POA: Diagnosis not present

## 2016-11-13 DIAGNOSIS — I480 Paroxysmal atrial fibrillation: Secondary | ICD-10-CM

## 2016-11-13 MED ORDER — DILTIAZEM HCL ER COATED BEADS 240 MG PO CP24: 240.0000 mg | ORAL_CAPSULE | Freq: Every day | ORAL | 3 refills | Status: DC

## 2016-11-13 MED ORDER — DILTIAZEM HCL 60 MG PO TABS: 60.0000 mg | ORAL_TABLET | Freq: Four times a day (QID) | ORAL | 3 refills | Status: DC | PRN

## 2016-11-13 NOTE — Progress Notes (Signed)
Bridget Mcdonald. [address] [place], [place]  [address] Phone: [phone] Fax:  [phone]  Date:  11/13/2016   ID:  Bridget Mcdonald, DOB Aug 23, 1945, MRN [id number]  PCP:  Aretta Nip, MD   History of Present Illness: Bridget Mcdonald is a 71 y.o. female with paroxysmal atrial fibrillation, anxiety palpitations here for followup. 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 She's been taking diltiazem.   She has been battling left-sided headaches. She has seen neurology. MRA/MRI. 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 She tried physical therapy.  She ended up seeing Roderic Palau. Recommended anticoagulation. She has several reasons previously why she was hesitant to take the medication.   11/13/16-overall states that she has been in atrial fibrillation, felted in the shower.  Feels awful with atrial fibrillation.  He also has leg pain no evidence of obstruction on arterial studies.  Denies any syncope, bleeding, orthopnea.  She says that she has capillary venous bursts in her palms and the bottoms of feet.  Was worried about lower extremity edema that was occurring.  Wear TED hose.  This seemed to help but caused her edema to be just below her knee.     Wt Readings from Last 3 Encounters:  11/13/16 147 lb (66.7 kg)  04/24/16 147 lb 3.2 oz (66.8 kg)  04/12/16 146 lb 12.8 oz (66.6 kg)     Past Medical History:  Diagnosis Date  . Acid reflux   . Allergy history unknown   . Asthma, mild intermittent   . Barrett's esophagus    [place]  . Cancer ([place])    skin, breast (left)  . Chronic headaches   . Cyst of left kidney    mult-locular Dr.Dahlstedt  . Hematuria    Dr. Zannie Cove  . High cholesterol   . History of cardiac arrhythmia   . History of colon polyps    04/2009 [place] unknown histology  . Hyperlipidemia    . Hypertension   . Hypothyroidism   . Hypotonia    [name] 09/2012  . IBS (irritable bowel syndrome)   . Incontinence, feces    09/2012  . Migraine headache   . Osteopenia   . Palpitations 12/01/2012   Echocardiogram - 7/13-normal EF, trace valvular lesions, mildly AI  . PVC's (premature ventricular contractions)   . Vertigo   . Vestibular neuronitis    herpetic  . Vocal cord polyps    sees DR. Cristina Gong    Past Surgical History:  Procedure Laterality Date  . APPENDECTOMY  1964  . BREAST LUMPECTOMY  2005?    left  . COLONOSCOPY  04/2009  . OOPHORECTOMY  1968?  Marland Kitchen SKIN SURGERY  08/2012  . TONSILECTOMY, ADENOIDECTOMY, BILATERAL MYRINGOTOMY AND TUBES  1961  . TUBAL LIGATION      Current Outpatient Prescriptions  Medication Sig Dispense Refill  . alendronate (FOSAMAX) 70 MG tablet Take 70 mg by mouth once a week. Take with a full glass of water on an empty stomach.    Marland Kitchen atorvastatin (LIPITOR) 10 MG tablet Take 10 mg by mouth daily.    Marland Kitchen CALCIUM-PHOSPHORUS PO Take by mouth daily.    Marland Kitchen diltiazem (CARDIZEM) 60 MG tablet Take 1 tablet (60 mg total) by mouth every 6 (six) hours as needed. 90 tablet 3  . diltiazem (CARTIA XT) 240 MG 24 hr capsule Take  1 capsule (240 mg total) by mouth daily. 90 capsule 3  . FLUZONE HIGH-DOSE 0.5 ML injection TO BE ADMINISTERED BY PHARMACIST FOR IMMUNIZATION  0  . hyoscyamine (NULEV) 0.125 MG TBDP disintergrating tablet Place 0.125 mg under the tongue every 4 (four) hours as needed (GI spasms).     Marland Kitchen levothyroxine (SYNTHROID, LEVOTHROID) 200 MCG tablet Take 200 mcg by mouth daily.     . Multiple Vitamin (MULTIVITAMIN) tablet Take 1 tablet by mouth daily.     . vitamin C (ASCORBIC ACID) 500 MG tablet Take 500 mg by mouth daily.     No current facility-administered medications for this visit.     Allergies:    Allergies  Allergen Reactions  . Demerol [Meperidine]     hallucinations  . Epinephrine     Super hyper  . Gabapentin Diarrhea  .  Quinine Derivatives     deaf    Social History:  The patient  reports that she has been smoking Cigarettes.  She has a 75.00 pack-year smoking history. She has never used smokeless tobacco. She reports that she drinks alcohol. She reports that she does not use drugs.   ROS:  Please see the history of present illness.   No CP, no SOB. Rushing in left ear at times.     PHYSICAL EXAM: VS:  BP 130/72   Pulse 99   Ht 5' 6.5" (1.689 m)   Wt 147 lb (66.7 kg)   LMP  (LMP Unknown)   SpO2 96%   BMI 23.37 kg/m   GEN: Well nourished, well developed, in no acute distress  HEENT: normal  Neck: no JVD, carotid bruits, or masses Cardiac: irreg irreg; no murmurs, rubs, or gallops,no edema  Respiratory:  clear to auscultation bilaterally, normal work of breathing GI: soft, nontender, nondistended, + BS MS: no deformity or atrophy  Skin: warm and dry, no rash, varicose Neuro:  Alert and Oriented x 3, Strength and sensation are intact Psych: euthymic mood, full affect    EKG:   11/24/15-94 beats a minute biatrial enlargement 12/13/14-sinus rhythm, 86 , no other abnormalities. Personally viewed-prior11/18/15-sinus rhythm 86 bpm with no other abnormalities prior NSR 67    Event monitor 11/28/15:  The longest AF episode lasted for 09:08:42  Episose of atrial fibrilation detected. Peak HR 150bpm  ASSESSMENT AND PLAN:  1. Parox AFIB-Palpitations -AFIB 150 brief RVR.  Currently sounds like she is in atrial fibrillation. She is quite skeptical about use of anticoagulation.  Worried about bleeding hemorrhoid. Discussed with GI, okay to use, Dr. Cristina Gong. Previously much improved on diltiazem.  Used Eliquis 5 mg twice a day in the past however she decided to stop it her own.  Not interested in Coumadin.  She understands potential risks of not being on anticoagulation, stroke, and takes full responsibility.. Prior nuclear stress test in 2013 reassuring. Echocardiogram reassuring in 2013 as well as 2017.  Personally reviewed Echo for her. She had highlighted sections. Reassured her. 2. Intermittent chest pain/claudication-this was discussed with Roderic Palau, claudication symptoms seem to greatly limit her activities she states.  Her Dopplers were performed in July 2018 and were overall nonobstructive.  Continue to encourage ambulation.  She states that she only walks/exercises 2-1/2 minutes twice a day. 3. Hypertension-usually her blood pressure is fairly well controlled. No changes made to her regimen today. She was anxious. Sometimes they have been elevated at home. 4.  headaches- left-sided upper head pain. She had benign workup  5. Anxiety-continue to work  with primary physician. 6. Tobacco use-cessation encouraged. She understands risks involved. 7. 6 months with Cecilie Kicks, NP.   Signed, Candee Furbish, MD [facility]  11/13/2016 3:48 PM

## 2016-11-13 NOTE — Patient Instructions (Signed)
Medication Instructions:  The current medical regimen is effective;  continue present plan and medications.  Follow-Up: Follow up in 6 months with Laura Ingold, NP.  You will receive a letter in the mail 2 months before you are due.  Please call us when you receive this letter to schedule your follow up appointment.  Follow up in 1 year with Dr. Skains.  You will receive a letter in the mail 2 months before you are due.  Please call us when you receive this letter to schedule your follow up appointment.  If you need a refill on your cardiac medications before your next appointment, please call your pharmacy.  Thank you for choosing [facility]!!     

## 2016-12-04 DIAGNOSIS — E039 Hypothyroidism, unspecified: Secondary | ICD-10-CM | POA: Diagnosis not present

## 2016-12-23 ENCOUNTER — Ambulatory Visit: Payer: Medicare Other | Admitting: Psychology

## 2017-01-21 ENCOUNTER — Ambulatory Visit: Payer: Medicare Other | Admitting: Psychology

## 2017-03-07 NOTE — Progress Notes (Deleted)
Cardiology Office Note   Date:  03/07/2017   ID:  Bridget Mcdonald, DOB Jan 05, 1946, MRN [id number]  PCP:  Aretta Nip, MD  Cardiologist:  Dr. Marlou Porch     No chief complaint on file.     History of Present Illness: Bridget Mcdonald is a 72 y.o. female who presents for ***  with paroxysmal atrial fibrillation, anxiety palpitations here for followup. 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 She's been taking diltiazem.   She did not want to be on anticoagulation.  She has been aware of risk.  nuc study 2013 neg for ischemia, Echo in 2017 was stable.  Claudication though dopplers 07/2016 were nonobstructive. Dr. Gwenlyn Found did not believe blood flow issues were causing claudication.   She continues to smoke.    Past Medical History:  Diagnosis Date  . Acid reflux   . Allergy history unknown   . Asthma, mild intermittent   . Barrett's esophagus    [place]  . Cancer ([place])    skin, breast (left)  . Chronic headaches   . Cyst of left kidney    mult-locular Dr.Dahlstedt  . Hematuria    Dr. Zannie Cove  . High cholesterol   . History of cardiac arrhythmia   . History of colon polyps    04/2009 [place] unknown histology  . Hyperlipidemia   . Hypertension   . Hypothyroidism   . Hypotonia    [name] 09/2012  . IBS (irritable bowel syndrome)   . Incontinence, feces    09/2012  . Migraine headache   . Osteopenia   . Palpitations 12/01/2012   Echocardiogram - 7/13-normal EF, trace valvular lesions, mildly AI  . PVC's (premature ventricular contractions)   . Vertigo   . Vestibular neuronitis    herpetic  . Vocal cord polyps    sees DR. Cristina Gong    Past Surgical History:  Procedure Laterality Date  . APPENDECTOMY  1964  . BREAST LUMPECTOMY  2005?    left  . COLONOSCOPY  04/2009  . OOPHORECTOMY  1968?  Marland Kitchen SKIN SURGERY  08/2012  . TONSILECTOMY,  ADENOIDECTOMY, BILATERAL MYRINGOTOMY AND TUBES  1961  . TUBAL LIGATION       Current Outpatient Medications  Medication Sig Dispense Refill  . alendronate (FOSAMAX) 70 MG tablet Take 70 mg by mouth once a week. Take with a full glass of water on an empty stomach.    Marland Kitchen atorvastatin (LIPITOR) 10 MG tablet Take 10 mg by mouth daily.    Marland Kitchen CALCIUM-PHOSPHORUS PO Take by mouth daily.    Marland Kitchen diltiazem (CARDIZEM) 60 MG tablet Take 1 tablet (60 mg total) by mouth every 6 (six) hours as needed. 90 tablet 3  . diltiazem (CARTIA XT) 240 MG 24 hr capsule Take 1 capsule (240 mg total) by mouth daily. 90 capsule 3  . FLUZONE HIGH-DOSE 0.5 ML injection TO BE ADMINISTERED BY PHARMACIST FOR IMMUNIZATION  0  . hyoscyamine (NULEV) 0.125 MG TBDP disintergrating tablet Place 0.125 mg under the tongue every 4 (four) hours as needed (GI spasms).     Marland Kitchen levothyroxine (SYNTHROID, LEVOTHROID) 200 MCG tablet Take 200 mcg by mouth daily.     . Multiple Vitamin (MULTIVITAMIN) tablet Take 1 tablet by mouth daily.     . vitamin C (ASCORBIC ACID) 500 MG tablet Take 500 mg by mouth daily.     No current facility-administered medications for this visit.  Allergies:   Demerol [meperidine]; Epinephrine; Gabapentin; and Quinine derivatives    Social History:  The patient  reports that she has been smoking cigarettes.  She has a 75.00 pack-year smoking history. she has never used smokeless tobacco. She reports that she drinks alcohol. She reports that she does not use drugs.   Family History:  The patient's ***family history includes Bladder Cancer in her unknown relative; Cancer in her brother and father; Emphysema in her mother; Heart disease in her paternal grandfather.    ROS:  General:no colds or fevers, no weight changes Skin:no rashes or ulcers HEENT:no blurred vision, no congestion CV:see HPI PUL:see HPI GI:no diarrhea constipation or melena, no indigestion GU:no hematuria, no dysuria MS:no joint pain, no  claudication Neuro:no syncope, no lightheadedness Endo:no diabetes, no thyroid disease Wt Readings from Last 3 Encounters:  11/13/16 147 lb (66.7 kg)  04/24/16 147 lb 3.2 oz (66.8 kg)  04/12/16 146 lb 12.8 oz (66.6 kg)     PHYSICAL EXAM: VS:  LMP  (LMP Unknown)  , BMI There is no height or weight on file to calculate BMI. General:Pleasant affect, NAD Skin:Warm and dry, brisk capillary refill HEENT:normocephalic, sclera clear, mucus membranes moist Neck:supple, no JVD, no bruits  Heart:S1S2 RRR without murmur, gallup, rub or click Lungs:clear without rales, rhonchi, or wheezes MLY:YTKP, non tender, + BS, do not palpate liver spleen or masses Ext:no lower ext edema, 2+ pedal pulses, 2+ radial pulses Neuro:alert and oriented, MAE, follows commands, + facial symmetry    EKG:  EKG is ordered today. The ekg ordered today demonstrates ***   Recent Labs: 04/12/2016: BUN 15; Creatinine, Ser 0.65; NT-Pro BNP 139; Potassium 4.1; Sodium 141    Lipid Panel No results found for: CHOL, TRIG, HDL, CHOLHDL, VLDL, LDLCALC, LDLDIRECT     Other studies Reviewed: Additional studies/ records that were reviewed today include: ***. ECHO 01/01/16  Study Conclusions  - Left ventricle: The cavity size was normal. Wall thickness was   normal. Systolic function was vigorous. The estimated ejection   fraction was in the range of 65% to 70%. Wall motion was normal;   there were no regional wall motion abnormalities. Doppler   parameters are consistent with abnormal left ventricular   relaxation (grade 1 diastolic dysfunction). The E/e&' ratio is   between 8-15, suggesting indeterminate LV filling pressure. - Aortic valve: Sclerosis without stenosis. There was trivial   regurgitation. - Left atrium: The atrium was normal in size. - Right atrium: The atrium was normal in size. - Inferior vena cava: The vessel was normal in size. The   respirophasic diameter changes were in the normal range (>=  50%),   consistent with normal central venous pressure.  Impressions:  - LVEF 65-70%, normal wall thickness and motion, diastolic   dysfunction, indeterminate LV filling pressure, aortic valve   sclerosis with trivial AI, normal biatrial size, normal IVC.   ASSESSMENT AND PLAN:  1.  ***   Current medicines are reviewed with the patient today.  The patient Has no concerns regarding medicines.  The following changes have been made:  See above Labs/ tests ordered today include:see above  Disposition:   FU:  see above  Signed, Cecilie Kicks, NP  03/07/2017 7:24 PM    [facility] [place], [place], [place], [place] Phone: [phone]; Fax: [phone]

## 2017-03-10 ENCOUNTER — Ambulatory Visit: Payer: Medicare Other | Admitting: Cardiology

## 2017-03-11 ENCOUNTER — Encounter: Payer: Self-pay | Admitting: Cardiology

## 2017-03-31 NOTE — Telephone Encounter (Signed)
Error

## 2017-04-15 DIAGNOSIS — E0590 Thyrotoxicosis, unspecified without thyrotoxic crisis or storm: Secondary | ICD-10-CM | POA: Diagnosis not present

## 2017-04-15 DIAGNOSIS — E7800 Pure hypercholesterolemia, unspecified: Secondary | ICD-10-CM | POA: Diagnosis not present

## 2017-04-22 DIAGNOSIS — G2581 Restless legs syndrome: Secondary | ICD-10-CM | POA: Diagnosis not present

## 2017-04-22 DIAGNOSIS — R7301 Impaired fasting glucose: Secondary | ICD-10-CM | POA: Diagnosis not present

## 2017-04-22 DIAGNOSIS — I868 Varicose veins of other specified sites: Secondary | ICD-10-CM | POA: Diagnosis not present

## 2017-04-22 DIAGNOSIS — R600 Localized edema: Secondary | ICD-10-CM | POA: Diagnosis not present

## 2017-08-15 ENCOUNTER — Other Ambulatory Visit: Payer: Self-pay | Admitting: Family Medicine

## 2017-08-15 DIAGNOSIS — Z1231 Encounter for screening mammogram for malignant neoplasm of breast: Secondary | ICD-10-CM

## 2017-09-23 ENCOUNTER — Ambulatory Visit: Payer: Self-pay

## 2017-10-20 DIAGNOSIS — Z0000 Encounter for general adult medical examination without abnormal findings: Secondary | ICD-10-CM | POA: Diagnosis not present

## 2017-10-20 DIAGNOSIS — E7800 Pure hypercholesterolemia, unspecified: Secondary | ICD-10-CM | POA: Diagnosis not present

## 2017-10-20 DIAGNOSIS — I480 Paroxysmal atrial fibrillation: Secondary | ICD-10-CM | POA: Diagnosis not present

## 2017-10-20 DIAGNOSIS — M810 Age-related osteoporosis without current pathological fracture: Secondary | ICD-10-CM | POA: Diagnosis not present

## 2017-10-20 DIAGNOSIS — E039 Hypothyroidism, unspecified: Secondary | ICD-10-CM | POA: Diagnosis not present

## 2017-10-22 ENCOUNTER — Ambulatory Visit
Admission: RE | Admit: 2017-10-22 | Discharge: 2017-10-22 | Disposition: A | Discharge status: Home / Self Care | Payer: Medicare Other | Source: Ambulatory Visit | Attending: Family Medicine | Admitting: Family Medicine

## 2017-10-22 DIAGNOSIS — Z1231 Encounter for screening mammogram for malignant neoplasm of breast: Secondary | ICD-10-CM | POA: Diagnosis not present

## 2017-10-29 ENCOUNTER — Encounter: Payer: Self-pay | Admitting: *Deleted

## 2017-10-30 DIAGNOSIS — R7301 Impaired fasting glucose: Secondary | ICD-10-CM | POA: Diagnosis not present

## 2017-10-30 DIAGNOSIS — E039 Hypothyroidism, unspecified: Secondary | ICD-10-CM | POA: Diagnosis not present

## 2017-11-18 ENCOUNTER — Encounter: Payer: Self-pay | Admitting: Cardiology

## 2017-11-18 ENCOUNTER — Ambulatory Visit (INDEPENDENT_AMBULATORY_CARE_PROVIDER_SITE_OTHER): Payer: Medicare Other | Admitting: Cardiology

## 2017-11-18 VITALS — BP 146/60 | HR 80 | Ht 66.5 in | Wt 148.4 lb

## 2017-11-18 DIAGNOSIS — R600 Localized edema: Secondary | ICD-10-CM | POA: Diagnosis not present

## 2017-11-18 DIAGNOSIS — I10 Essential (primary) hypertension: Secondary | ICD-10-CM

## 2017-11-18 DIAGNOSIS — I480 Paroxysmal atrial fibrillation: Secondary | ICD-10-CM | POA: Diagnosis not present

## 2017-11-18 DIAGNOSIS — Z23 Encounter for immunization: Secondary | ICD-10-CM | POA: Diagnosis not present

## 2017-11-18 MED ORDER — POTASSIUM CHLORIDE ER 10 MEQ PO TBCR: 10.0000 meq | EXTENDED_RELEASE_TABLET | Freq: Every day | ORAL | 3 refills | Status: DC

## 2017-11-18 MED ORDER — FUROSEMIDE 20 MG PO TABS: 20.0000 mg | ORAL_TABLET | Freq: Every day | ORAL | 3 refills | Status: DC

## 2017-11-18 NOTE — Patient Instructions (Signed)
Medication Instructions:  Please start Furosemide 20 mg daily and Potassium Chloride 10 MEQ daily. Continue all other medications as listed.  If you need a refill on your cardiac medications before your next appointment, please call your pharmacy.   Follow-Up: At [facility], you and your health needs are our priority.  As part of our continuing mission to provide you with exceptional heart care, we have created designated Provider Care Teams.  These Care Teams include your primary Cardiologist (physician) and Advanced Practice Providers (APPs -  Physician Assistants and Nurse Practitioners) who all work together to provide you with the care you need, when you need it. You will need a follow up appointment in 12 months.  Please call our office 2 months in advance to schedule this appointment.  You may see Dr Candee Furbish or one of the following Advanced Practice Providers on your designated Care Team:   Truitt Merle, NP Cecilie Kicks, NP . Kathyrn Drown, NP .   Thank you for choosing [facility]!!

## 2017-11-18 NOTE — Progress Notes (Signed)
Cardiology Office Note:    Date:  11/18/2017   ID:  Bridget Mcdonald, DOB 18-Dec-1945, MRN [id number]  PCP:  Aretta Nip, MD  Cardiologist:  No primary care provider on file.  Electrophysiologist:  None   Referring MD: Aretta Nip, MD     History of Present Illness:    Bridget Mcdonald is a 72 y.o. female here for the follow-up of symptomatic paroxysmal atrial fibrillation, chads vas score of 4, palpitations.  She is retired Marine scientist.  She is highly symptomatic with her atrial fibrillation.  She has seen Roderic Palau in the past who recommended anticoagulation.  She is very hesitant to utilize.  Previous cardiac work-up included negative nuclear stress test normal echocardiogram and Holter monitor showing frequent PVCs.  She is also been worked up in the past for temporal arteritis because of her headaches.   Event monitor 11/28/15:  The longest AF episode lasted for 09:08:42  Episose of atrial fibrilation detected. Peak HR 150bpm  Overall she was worried about her lower extremity edema especially when she is sitting at the computer.  She tries to get up and move around but it still swells quite a bit.  She is also having some chest discomfort that feels like her thymus is tight when she lays down.  When she sits up, atypical.  She still continues to smoke.  Her blood pressure has not been as controlled as it once was in the past  Past Medical History:  Diagnosis Date  . Acid reflux   . Allergy history unknown   . Asthma, mild intermittent   . Barrett's esophagus    [place]  . Cancer ([place])    skin, breast (left)  . Chronic headaches   . Cyst of left kidney    mult-locular Dr.Dahlstedt  . Hematuria    Dr. Zannie Cove  . High cholesterol   . History of cardiac arrhythmia   . History of colon polyps    04/2009 [place] unknown histology  . Hyperlipidemia   . Hypertension   . Hypothyroidism   . Hypotonia    [name] 09/2012  . IBS (irritable  bowel syndrome)   . Incontinence, feces    09/2012  . Migraine headache   . Osteopenia   . Palpitations 12/01/2012   Echocardiogram - 7/13-normal EF, trace valvular lesions, mildly AI  . PVC's (premature ventricular contractions)   . Vertigo   . Vestibular neuronitis    herpetic  . Vocal cord polyps    sees DR. Cristina Gong    Past Surgical History:  Procedure Laterality Date  . APPENDECTOMY  1964  . BREAST LUMPECTOMY Left 2005  . COLONOSCOPY  04/2009  . OOPHORECTOMY  1968?  Marland Kitchen SKIN SURGERY  08/2012  . TONSILECTOMY, ADENOIDECTOMY, BILATERAL MYRINGOTOMY AND TUBES  1961  . TUBAL LIGATION      Current Medications: Current Meds  Medication Sig  . alendronate (FOSAMAX) 70 MG tablet Take 70 mg by mouth once a week. Take with a full glass of water on an empty stomach.  Marland Kitchen atorvastatin (LIPITOR) 10 MG tablet Take 10 mg by mouth daily.  Marland Kitchen CALCIUM-PHOSPHORUS PO Take by mouth daily.  Marland Kitchen diltiazem (CARDIZEM) 60 MG tablet Take 1 tablet (60 mg total) by mouth every 6 (six) hours as needed.  . diltiazem (CARTIA XT) 240 MG 24 hr capsule Take 1 capsule (240 mg total) by mouth daily.  Marland Kitchen FLUZONE HIGH-DOSE 0.5 ML injection TO BE ADMINISTERED BY PHARMACIST FOR IMMUNIZATION  .  hyoscyamine (NULEV) 0.125 MG TBDP disintergrating tablet Place 0.125 mg under the tongue every 4 (four) hours as needed (GI spasms).   Marland Kitchen levothyroxine (SYNTHROID, LEVOTHROID) 150 MCG tablet Take 150 mcg by mouth daily.  Marland Kitchen levothyroxine (SYNTHROID, LEVOTHROID) 200 MCG tablet Take 200 mcg by mouth daily.   . Multiple Vitamin (MULTIVITAMIN) tablet Take 1 tablet by mouth daily.   . vitamin C (ASCORBIC ACID) 500 MG tablet Take 500 mg by mouth daily.     Allergies:   Demerol [meperidine]; Epinephrine; Gabapentin; and Quinine derivatives   Social History   Socioeconomic History  . Marital status: Single    Spouse name: Not on file  . Number of children: 1  . Years of education: Post Grad  . Highest education level: Not on file    Occupational History  . Occupation: Retired     Comment:  Software engineer  . Financial resource strain: Not on file  . Food insecurity:    Worry: Not on file    Inability: Not on file  . Transportation needs:    Medical: Not on file    Non-medical: Not on file  Tobacco Use  . Smoking status: Current Every Day Smoker    Packs/day: 1.50    Years: 50.00    Pack years: 75.00    Types: Cigarettes  . Smokeless tobacco: Never Used  Substance and Sexual Activity  . Alcohol use: Yes    Comment: rare  . Drug use: No  . Sexual activity: Not on file  Lifestyle  . Physical activity:    Days per week: Not on file    Minutes per session: Not on file  . Stress: Not on file  Relationships  . Social connections:    Talks on phone: Not on file    Gets together: Not on file    Attends religious service: Not on file    Active member of club or organization: Not on file    Attends meetings of clubs or organizations: Not on file    Relationship status: Not on file  Other Topics Concern  . Not on file  Social History Narrative   Lives at home with herself.   Caffeine use: 2 cups per day.   Right-handed     Family History: The patient's family history includes Bladder Cancer in her unknown relative; Cancer in her brother and father; Emphysema in her mother; Heart disease in her paternal grandfather.  ROS:   Please see the history of present illness.    Positive for back pain muscle pain dizziness bruising wheezing chest discomfort when laying down all other systems reviewed and are negative.  EKGs/Labs/Other Studies Reviewed:    The following studies were reviewed today: Prior office note EKG lab work reviewed  EKG:  EKG is  ordered today.  The ekg ordered today demonstrates sinus rhythm 80 with possible left atrial enlargement no other abnormalities detected.  Recent Labs: No results found for requested labs within last 8760 hours.  Recent Lipid Panel No results found  for: CHOL, TRIG, HDL, CHOLHDL, VLDL, LDLCALC, LDLDIRECT  Physical Exam:    VS:  BP (!) 146/60   Pulse 80   Ht 5' 6.5" (1.689 m)   Wt 148 lb 6.4 oz (67.3 kg)   LMP  (LMP Unknown)   BMI 23.59 kg/m     Wt Readings from Last 3 Encounters:  11/18/17 148 lb 6.4 oz (67.3 kg)  11/13/16 147 lb (66.7 kg)  04/24/16 147  lb 3.2 oz (66.8 kg)     GEN:  Well nourished, well developed in no acute distress HEENT: Normal NECK: No JVD; No carotid bruits LYMPHATICS: No lymphadenopathy CARDIAC: RRR, no murmurs, rubs, gallops RESPIRATORY:  Clear to auscultation without rales, wheezing or rhonchi  ABDOMEN: Soft, non-tender, non-distended MUSCULOSKELETAL:  trace edema; No deformity  SKIN: Warm and dry NEUROLOGIC:  Alert and oriented x 3 PSYCHIATRIC:  Normal affect   ASSESSMENT:    1. Paroxysmal atrial fibrillation (HCC)   2. Essential hypertension   3. Lower extremity edema    PLAN:    In order of problems listed above:  Paroxysmal atrial fibrillation - Once again encouraged use of anticoagulation given her chads vas score of 4.  She does not wish to utilize and understands and accepts full responsibility.  Buried previously about rectal bleeding from hemorrhoids.  Dr. Cristina Gong did say it was okay to utilize in the past.  History of chest pain/claudication - Lower extremity Dopplers and nuclear stress test were both reassuring.  Essential hypertension - Adding Lasix 20 mg once a day.  She has had some lower extremity edema.  This should help with both.  Lower extremity edema -We will give her Lasix 20 mg once a day with potassium 10 mEq daily.  She does have some leg cramping at times.  Her most recent potassium was excellent at 4.0 on October 20, 2017.  Creatinine was 0.8 at that time.   Medication Adjustments/Labs and Tests Ordered: Current medicines are reviewed at length with the patient today.  Concerns regarding medicines are outlined above.  Orders Placed This Encounter   Procedures  . EKG 12-Lead   Meds ordered this encounter  Medications  . furosemide (LASIX) 20 MG tablet    Sig: Take 1 tablet (20 mg total) by mouth daily.    Dispense:  90 tablet    Refill:  3  . potassium chloride (K-DUR) 10 MEQ tablet    Sig: Take 1 tablet (10 mEq total) by mouth daily.    Dispense:  90 tablet    Refill:  3    Patient Instructions  Medication Instructions:  Please start Furosemide 20 mg daily and Potassium Chloride 10 MEQ daily. Continue all other medications as listed.  If you need a refill on your cardiac medications before your next appointment, please call your pharmacy.   Follow-Up: At [facility], you and your health needs are our priority.  As part of our continuing mission to provide you with exceptional heart care, we have created designated Provider Care Teams.  These Care Teams include your primary Cardiologist (physician) and Advanced Practice Providers (APPs -  Physician Assistants and Nurse Practitioners) who all work together to provide you with the care you need, when you need it. You will need a follow up appointment in 12 months.  Please call our office 2 months in advance to schedule this appointment.  You may see Dr Candee Furbish or one of the following Advanced Practice Providers on your designated Care Team:   Truitt Merle, NP Cecilie Kicks, NP . Kathyrn Drown, NP .   Thank you for choosing [facility]!!          Signed, Candee Furbish, MD  11/18/2017 3:33 PM    [place]

## 2017-12-01 ENCOUNTER — Other Ambulatory Visit: Payer: Self-pay | Admitting: Cardiology

## 2018-01-21 DIAGNOSIS — E039 Hypothyroidism, unspecified: Secondary | ICD-10-CM | POA: Diagnosis not present

## 2018-08-12 ENCOUNTER — Other Ambulatory Visit: Payer: Self-pay | Admitting: Cardiology

## 2018-09-08 DIAGNOSIS — R05 Cough: Secondary | ICD-10-CM | POA: Diagnosis not present

## 2018-09-15 DIAGNOSIS — R042 Hemoptysis: Secondary | ICD-10-CM | POA: Diagnosis not present

## 2018-09-15 DIAGNOSIS — F17210 Nicotine dependence, cigarettes, uncomplicated: Secondary | ICD-10-CM | POA: Diagnosis not present

## 2018-09-16 ENCOUNTER — Other Ambulatory Visit: Payer: Self-pay | Admitting: Family Medicine

## 2018-09-16 DIAGNOSIS — R042 Hemoptysis: Secondary | ICD-10-CM

## 2018-09-23 ENCOUNTER — Other Ambulatory Visit: Payer: Self-pay

## 2018-09-23 ENCOUNTER — Ambulatory Visit
Admission: RE | Admit: 2018-09-23 | Discharge: 2018-09-23 | Disposition: A | Discharge status: Home / Self Care | Payer: Medicare Other | Source: Ambulatory Visit | Attending: Family Medicine | Admitting: Family Medicine

## 2018-09-23 DIAGNOSIS — N281 Cyst of kidney, acquired: Secondary | ICD-10-CM | POA: Diagnosis not present

## 2018-09-23 DIAGNOSIS — R918 Other nonspecific abnormal finding of lung field: Secondary | ICD-10-CM | POA: Diagnosis not present

## 2018-09-23 DIAGNOSIS — R042 Hemoptysis: Secondary | ICD-10-CM

## 2018-09-23 MED ORDER — IOPAMIDOL (ISOVUE-300) INJECTION 61%
75.0000 mL | Freq: Once | INTRAVENOUS | Status: AC | PRN
  Administered 2018-09-23: 16:00:00 75 mL via INTRAVENOUS

## 2018-11-03 ENCOUNTER — Ambulatory Visit (INDEPENDENT_AMBULATORY_CARE_PROVIDER_SITE_OTHER): Payer: Medicare Other | Admitting: Internal Medicine

## 2018-11-03 ENCOUNTER — Encounter: Payer: Self-pay | Admitting: Internal Medicine

## 2018-11-03 ENCOUNTER — Other Ambulatory Visit: Payer: Self-pay

## 2018-11-03 VITALS — BP 132/62 | HR 80 | Temp 97.2°F | Ht 67.0 in | Wt 153.2 lb

## 2018-11-03 DIAGNOSIS — Z8679 Personal history of other diseases of the circulatory system: Secondary | ICD-10-CM

## 2018-11-03 DIAGNOSIS — I050 Rheumatic mitral stenosis: Secondary | ICD-10-CM

## 2018-11-03 DIAGNOSIS — R042 Hemoptysis: Secondary | ICD-10-CM | POA: Diagnosis not present

## 2018-11-03 DIAGNOSIS — Z87891 Personal history of nicotine dependence: Secondary | ICD-10-CM

## 2018-11-03 DIAGNOSIS — I499 Cardiac arrhythmia, unspecified: Secondary | ICD-10-CM | POA: Diagnosis not present

## 2018-11-03 DIAGNOSIS — Z87448 Personal history of other diseases of urinary system: Secondary | ICD-10-CM

## 2018-11-03 LAB — HEPATIC FUNCTION PANEL
ALT: 19 U/L (ref 0–35)
AST: 17 U/L (ref 0–37)
Albumin: 4.3 g/dL (ref 3.5–5.2)
Alkaline Phosphatase: 88 U/L (ref 39–117)
Bilirubin, Direct: 0.1 mg/dL (ref 0.0–0.3)
Total Bilirubin: 0.5 mg/dL (ref 0.2–1.2)
Total Protein: 7 g/dL (ref 6.0–8.3)

## 2018-11-03 LAB — CBC WITH DIFFERENTIAL/PLATELET
Basophils Absolute: 0.1 10*3/uL (ref 0.0–0.1)
Basophils Relative: 0.6 % (ref 0.0–3.0)
Eosinophils Absolute: 0.2 10*3/uL (ref 0.0–0.7)
Eosinophils Relative: 1.5 % (ref 0.0–5.0)
HCT: 41.3 % (ref 36.0–46.0)
Hemoglobin: 13.9 g/dL (ref 12.0–15.0)
Lymphocytes Relative: 24.1 % (ref 12.0–46.0)
Lymphs Abs: 3 10*3/uL (ref 0.7–4.0)
MCHC: 33.6 g/dL (ref 30.0–36.0)
MCV: 98.3 fl (ref 78.0–100.0)
Monocytes Absolute: 1 10*3/uL (ref 0.1–1.0)
Monocytes Relative: 8.2 % (ref 3.0–12.0)
Neutro Abs: 8.2 10*3/uL — ABNORMAL HIGH (ref 1.4–7.7)
Neutrophils Relative %: 65.6 % (ref 43.0–77.0)
Platelets: 312 10*3/uL (ref 150.0–400.0)
RBC: 4.2 Mil/uL (ref 3.87–5.11)
RDW: 12.7 % (ref 11.5–15.5)
WBC: 12.5 10*3/uL — ABNORMAL HIGH (ref 4.0–10.5)

## 2018-11-03 LAB — BASIC METABOLIC PANEL
BUN: 20 mg/dL (ref 6–23)
CO2: 32 mEq/L (ref 19–32)
Calcium: 9.6 mg/dL (ref 8.4–10.5)
Chloride: 99 mEq/L (ref 96–112)
Creatinine, Ser: 0.92 mg/dL (ref 0.40–1.20)
GFR: 59.74 mL/min — ABNORMAL LOW (ref >60.00)
Glucose, Bld: 129 mg/dL — ABNORMAL HIGH (ref 70–99)
Potassium: 3.6 mEq/L (ref 3.5–5.1)
Sodium: 138 mEq/L (ref 135–145)

## 2018-11-03 LAB — SEDIMENTATION RATE: Sed Rate: 6 mm/hr (ref 0–30)

## 2018-11-03 LAB — URINALYSIS, MICROSCOPIC ONLY: WBC, UA: NONE SEEN (ref 0–?)

## 2018-11-03 NOTE — Progress Notes (Signed)
Subjective:    Patient ID: Bridget Mcdonald, female    DOB: Apr 02, 1945, 73 y.o.   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  She is a heavy smoker.  She has multiple complaints with the main reason for referral and the main complaint currently is 1 of hemoptysis ongoing for couple of months.  It is new in onset.  She believes she might have something autoimmune going on.  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  In addition for the last 2 or 3 years she has had dry skin in her lower extremities and the skin is flaky.  She states that the etiologic diagnosis for both these conditions is idiopathic.  Then approximately 2 at the end of August she started developing hemoptysis.  Since then it has persisted although the active frank blood is no longer present since October 04, 2018 or so.  She states the first episode of hemoptysis started August 27, 2018 and went through the whole weekend.  Then around September 06, 2018 she was in bed with symptoms ranging from coughing of blood clots fatigue, freezing cold, nausea and uncontrollable diarrhea and a sense that her atrial fibrillation was not under control.  The following week she did have right neck and ear pain with pressure pain in the left lower lateral lung in the lower part.  Fatigue also started.  She had a CT scan of the chest on September 9 that shows right upper lobe posterior segment groundglass opacities consistent with local alveolar hemorrhage in my personal visualization and opinion.  However she points to the left lower lobe infrascapular area as a point of discomfort.  In this area there is a small subpleural nodularity.   Then from mid September through October 10, 2018 she took Avelox she continued to have fatigue and anxiety.  Around this timeframe hemoptysis resolved.  She says this is mainly because she is controlling her cough and clearing her throat.  But the chest pressure has persisted.  She feels the neuropathy has worsened.  She feels the same amount of edema as well.  She is also feels she is constantly hungry and rapidly gaining weight  She continues to smoke heavily.  Review of labs show nonbacterial microscopic hematuria in 2017.  She does not recollect taking antibiotics for this.  She had an echocardiogram in 2017 that was normal but she says she has a history of mitral valve prolapse.  She also has shortness of breath with exertion relieved by rest although walking desaturation test showed she did not desaturate.  She walked 185 feet x 3 laps in office with a resting pulse ox of 99% and the resting heart rate of 96/min.  When she finished she was pulse ox 100% and a heart rate of 117/min.  IMPRESSION: CT chest 1. Interlobular septal line thickening and ground-glass opacities in the medial right upper lobe. This is nonspecific, but can see be seen in pulmonary hemorrhage syndromes, pneumonia, and pulmonary alveolar proteinosis. 2. Multiple bilateral solid and ground-glass pulmonary nodules. Non-contrast chest CT at 3-6 months is recommended. If the nodules are stable at  time of repeat CT, then future CT at 18-24 months (from today's scan) is considered optional for low-risk patients, but is recommended for high-risk patients. This recommendation follows the consensus statement: Guidelines for Management of Incidental Pulmonary Nodules Detected on CT Images: From the Fleischner Society 2017; Radiology 2017; 284:228-243.  Aortic Atherosclerosis (ICD10-I70.0).   Electronically Signed   By: Zerita Boers M.D.   On: 09/24/2018 09:40     has a past medical history of Acid reflux,  Allergy history unknown, Asthma, mild intermittent, Barrett's esophagus, Cancer (HCC), Chronic headaches, Cyst of left kidney, Hematuria, High cholesterol, History of cardiac arrhythmia, History of colon polyps, Hyperlipidemia, Hypertension, Hypothyroidism, Hypotonia, IBS (irritable bowel syndrome), Incontinence, feces, Migraine headache, Osteopenia, Palpitations (12/01/2012), PVC's (premature ventricular contractions), Vertigo, Vestibular neuronitis, and Vocal cord polyps.   reports that she has been smoking cigarettes. She has a 75.00 pack-year smoking history. She has never used smokeless tobacco.  Past Surgical History:  Procedure Laterality Date   APPENDECTOMY  1964   BREAST LUMPECTOMY Left 2005   COLONOSCOPY  04/2009   OOPHORECTOMY  1968?   SKIN SURGERY  08/2012   TONSILECTOMY, ADENOIDECTOMY, BILATERAL MYRINGOTOMY AND TUBES  1961   TUBAL LIGATION      Allergies  Allergen Reactions   Demerol [Meperidine]     hallucinations   Epinephrine     Super hyper   Gabapentin Diarrhea   Quinine Derivatives     deaf    Immunization History  Administered Date(s) Administered   Influenza Split 10/15/2010   Pneumococcal Polysaccharide-23 01/14/2009    Family History  Problem Relation Age of Onset   Emphysema Mother    Cancer Father        prostate   Heart disease Paternal Grandfather    Cancer Brother        prostate   Bladder Cancer Unknown      Current Outpatient Medications:    alendronate (FOSAMAX) 70 MG tablet, Take 70 mg by mouth once a week. Take with a full glass of water on an empty stomach., Disp: , Rfl:    atorvastatin (LIPITOR) 10 MG tablet, Take 10 mg by mouth daily., Disp: , Rfl:    CALCIUM-PHOSPHORUS PO, Take by mouth daily., Disp: , Rfl:    CARTIA XT 240 MG 24 hr capsule, TAKE ONE CAPSULE BY MOUTH DAILY, Disp: 90 capsule, Rfl: 3   FLUZONE HIGH-DOSE 0.5 ML injection, TO BE ADMINISTERED BY PHARMACIST FOR IMMUNIZATION, Disp: , Rfl: 0    hyoscyamine (NULEV) 0.125 MG TBDP disintergrating tablet, Place 0.125 mg under the tongue every 4 (four) hours as needed (GI spasms). , Disp: , Rfl:    levothyroxine (SYNTHROID, LEVOTHROID) 150 MCG tablet, Take 150 mcg by mouth daily., Disp: , Rfl:    Multiple Vitamin (MULTIVITAMIN) tablet, Take 1 tablet by mouth daily. , Disp: , Rfl:    potassium chloride (K-DUR) 10 MEQ tablet, Take 1 tablet (10 mEq total) by mouth daily., Disp: 90 tablet, Rfl: 3   vitamin C (ASCORBIC ACID) 500 MG tablet, Take 500 mg by mouth daily., Disp: , Rfl:    Review of Systems  Constitutional: Positive for unexpected weight change. Negative for fever.  HENT: Positive for trouble swallowing. Negative for dental problem, ear pain, nosebleeds, postnasal drip, rhinorrhea, sinus pressure, sneezing and sore throat.   Eyes: Negative for redness and itching.  Respiratory: Positive for cough, chest tightness and shortness of breath. Negative for wheezing.   Cardiovascular: Positive for palpitations. Negative for leg swelling.  Gastrointestinal:  Negative for nausea and vomiting.  Genitourinary: Negative for dysuria.  Musculoskeletal: Positive for joint swelling.  Skin: Negative for rash.  Neurological: Positive for headaches.  Hematological: Does not bruise/bleed easily.  Psychiatric/Behavioral: Negative for dysphoric mood. The patient is nervous/anxious.        Objective:   Physical Exam  Vitals:   11/03/18 1106  BP: 132/62  Pulse: 80  Temp: (!) 97.2 F (36.2 C)  TempSrc: Oral  SpO2: 95%  Weight: 153 lb 3.2 oz (69.5 kg)  Height: 5' 7"  (1.702 m)    Estimated body mass index is 23.99 kg/m as calculated from the following:   Height as of this encounter: 5' 7"  (1.702 m).   Weight as of this encounter: 153 lb 3.2 oz (69.5 kg).   General Appearance:    Alert, cooperative, no distress, appears stated age - yes , sitting on - no  Head:    Normocephalic, without obvious abnormality, atraumatic  Eyes:    PERRL,  conjunctiva/corneas clear,  Ears:    Normal TM's and external ear canals, both ears  Nose:   Nares normal, septum midline, mucosa normal, no drainage    or sinus tenderness. OXYGEN ON no @ ra  Throat:   Lips, mucosa, and tongue normal; teeth and gums normal. Cyanosis on lips - no  Neck:   Supple, symmetrical, trachea midline, no adenopathy;    thyroid:  no enlargement/tenderness/nodules; no carotid   bruit or JVD  Back:     Symmetric, no curvature, ROM normal, no CVA tenderness  Lungs:     Distress - no , Wheeze no, Barrell Chest - no, Purse lip breathing - no, Crackles - no.SMELLS TOBACOO   Chest Wall:    No tenderness or deformity. Scars in chest no   Heart:    Regular rate and rhythm, S1 and S2 normal, no murmur, rub   or gallop  Breast Exam:    NOT DONE  Abdomen:     Soft, non-tender, bowel sounds active all four quadrants,    no masses, no organomegaly  Genitalia:   NOT DONE  Rectal:   NOT DONE  Extremities:   Extremities normal, atraumatic, Clubbing - no, Edema - no  Pulses:   2+ and symmetric all extremities  Skin:   Stigmata of Connective Tissue Disease - no. HAS PROMIENTN VESSELS in earl lobules  Lymph nodes:   Cervical, supraclavicular, and axillary nodes normal  Psychiatric:  Neurologic:   plesant CNII-XII intact, normal strength, sensation  throughout             Assessment & Plan:     ICD-10-CM   1. Cough with hemoptysis  R04.2   2. History of hematuria  Z87.448   3. History of smoking  Z87.891   4. History of mitral valve prolapse  Z86.79      Patient Instructions     ICD-10-CM   1. Cough with hemoptysis  R04.2   2. History of hematuria  Z87.448   3. History of smoking  Z87.891   4. History of mitral valve prolapse  Z86.79     Need to rule out vasculitis and autoimmune diseaes.  Other etiologies include pulmonary artery aneurysm and hidden tumor and infections  Plan   - do cbc with diff, bmet, lft 11/03/2018    - do urine microscopy (not  dipstick)   - Serum: ESR, ACE, ANA, DS-DNA, RF, anti-CCP,  ANCA screen, MPO, PR-3, Total CK,  Aldolase, scl-70, ssA, ssB, anti-RNP, anti-JO-1 ->  11/03/2018   -Check COVID-19 serology   - do echo  - do < 1 - 2 weeks   Followup - if above labs are normal - will decide on timing of repeat CT chest and also pulmonary function test   =- in a few weeks to several weeks but after completing above       SIGNATURE    Dr. Brand Males, M.D., F.C.C.P,  Pulmonary and Critical Care Medicine Staff Physician, [place] Director - Interstitial Lung Disease  Program  Pulmonary [place] [facility] [place], [place], [address]  Pager: [phone], If no answer or between  15:00h - 7:00h: call [phone] Telephone: [phone]  12:09 PM 11/03/2018

## 2018-11-03 NOTE — Patient Instructions (Addendum)
ICD-10-CM   1. Cough with hemoptysis  R04.2   2. History of hematuria  Z87.448   3. History of smoking  Z87.891   4. History of mitral valve prolapse  Z86.79     Need to rule out vasculitis and autoimmune diseaes  Plan   - do cbc with diff, bmet, lft 11/03/2018    - do urine microscopy (not dipstick)   - Serum: ESR, ACE, ANA, DS-DNA, RF, anti-CCP,  ANCA screen, MPO, PR-3, Total CK,  Aldolase, scl-70, ssA, ssB, anti-RNP, anti-JO-1 -> 11/03/2018   -Check COVID-19 serology   - do echo  - do < 1 - 2 weeks   Followup - if above labs are normal - will decide on timing of repeat CT chest and also pulmonary function test   =- in a few weeks to several weeks but after completing above

## 2018-11-04 LAB — CYCLIC CITRUL PEPTIDE ANTIBODY, IGG: Cyclic Citrullin Peptide Ab: <16 UNITS

## 2018-11-04 LAB — ANGIOTENSIN CONVERTING ENZYME: Angiotensin-Converting Enzyme: 20 U/L (ref 9–67)

## 2018-11-04 LAB — CK TOTAL AND CKMB (NOT AT ARMC)
CK, MB: 1.8 ng/mL (ref 0–5.0)
Relative Index: 2.9 (ref 0–4.0)
Total CK: 62 U/L (ref 29–143)

## 2018-11-04 LAB — MPO/PR-3 (ANCA) ANTIBODIES
Myeloperoxidase Abs: <1 AI
Serine Protease 3: <1 AI

## 2018-11-04 LAB — JO-1 ANTIBODY-IGG: Jo-1 Autoabs: <1 AI

## 2018-11-04 LAB — ALDOLASE: Aldolase: 5 U/L (ref <=8.1)

## 2018-11-04 LAB — RHEUMATOID FACTOR: Rhuematoid fact SerPl-aCnc: 24 IU/mL — ABNORMAL HIGH (ref <14)

## 2018-11-04 LAB — ANCA SCREEN W REFLEX TITER: ANCA Screen: NEGATIVE

## 2018-11-06 LAB — ANA+ENA+DNA/DS+SCL 70+SJOSSA/B
ANA Titer 1: NEGATIVE
ENA RNP Ab: <0.2 AI (ref 0.0–0.9)
ENA SM Ab Ser-aCnc: <0.2 AI (ref 0.0–0.9)
ENA SSA (RO) Ab: <0.2 AI (ref 0.0–0.9)
ENA SSB (LA) Ab: <0.2 AI (ref 0.0–0.9)
Scleroderma (Scl-70) (ENA) Antibody, IgG: <0.2 AI (ref 0.0–0.9)
dsDNA Ab: <1 IU/mL (ref 0–9)

## 2018-11-06 LAB — ANTIPROTEINASE 3 (PR-3) ABS: ANCA Proteinase 3: <3.5 U/mL (ref 0.0–3.5)

## 2018-11-09 ENCOUNTER — Other Ambulatory Visit: Payer: Self-pay | Admitting: Cardiology

## 2018-11-09 ENCOUNTER — Ambulatory Visit (HOSPITAL_COMMUNITY): Payer: Medicare Other

## 2018-11-20 ENCOUNTER — Inpatient Hospital Stay (HOSPITAL_COMMUNITY): Admission: RE | Admit: 2018-11-20 | Payer: Medicare Other | Source: Ambulatory Visit

## 2018-11-24 ENCOUNTER — Ambulatory Visit: Payer: Medicare Other | Admitting: Cardiology

## 2018-11-26 ENCOUNTER — Encounter: Payer: Self-pay | Admitting: Cardiology

## 2018-11-26 ENCOUNTER — Ambulatory Visit (INDEPENDENT_AMBULATORY_CARE_PROVIDER_SITE_OTHER): Payer: Medicare Other | Admitting: Cardiology

## 2018-11-26 ENCOUNTER — Other Ambulatory Visit: Payer: Self-pay

## 2018-11-26 ENCOUNTER — Other Ambulatory Visit: Payer: Self-pay | Admitting: Cardiology

## 2018-11-26 VITALS — BP 138/60 | HR 73 | Ht 67.0 in | Wt 155.0 lb

## 2018-11-26 DIAGNOSIS — R7309 Other abnormal glucose: Secondary | ICD-10-CM | POA: Diagnosis not present

## 2018-11-26 DIAGNOSIS — E039 Hypothyroidism, unspecified: Secondary | ICD-10-CM | POA: Diagnosis not present

## 2018-11-26 DIAGNOSIS — I480 Paroxysmal atrial fibrillation: Secondary | ICD-10-CM | POA: Diagnosis not present

## 2018-11-26 DIAGNOSIS — E559 Vitamin D deficiency, unspecified: Secondary | ICD-10-CM | POA: Diagnosis not present

## 2018-11-26 DIAGNOSIS — E7800 Pure hypercholesterolemia, unspecified: Secondary | ICD-10-CM | POA: Diagnosis not present

## 2018-11-26 DIAGNOSIS — R042 Hemoptysis: Secondary | ICD-10-CM

## 2018-11-26 DIAGNOSIS — I10 Essential (primary) hypertension: Secondary | ICD-10-CM | POA: Diagnosis not present

## 2018-11-26 NOTE — Progress Notes (Signed)
Cardiology Office Note:    Date:  11/26/2018   ID:  Bridget Mcdonald, DOB 1945/07/15, MRN [id number]  PCP:  Aretta Nip, MD  Cardiologist:  Candee Furbish, MD  Electrophysiologist:  None   Referring MD: Aretta Nip, MD     History of Present Illness:    Bridget Mcdonald is a 73 y.o. female here for the follow-up of symptomatic paroxysmal atrial fibrillation, chads vas score of 4, palpitations.  She is retired Marine scientist.  She is highly symptomatic with her atrial fibrillation.  She has seen Roderic Palau in the past who recommended anticoagulation.  She is very hesitant to utilize.  Previous cardiac work-up included negative nuclear stress test normal echocardiogram and Holter monitor showing frequent PVCs.  She is also been worked up in the past for temporal arteritis because of her headaches.   Event monitor 11/28/15:  The longest AF episode lasted for 09:08:42  Episose of atrial fibrilation detected. Peak HR 150bpm  11/26/2018-here for paroxysmal atrial fibrillation follow-up.  She does feel some palpitations off and frequently at times.  Sometimes when she is laying down at night she will feel her heart start to race or skip.  Certainly she is demonstrated atrial fibrillation in the past.  She does take a diltiazem 60 as needed if necessary.  If her heart rate is slow or below 70 often she will take a 30 mg tablet.  She is currently being worked up as well for hemoptysis with Dr. Chase Caller.  I personally reviewed her CT scan from an aortic atherosclerosis perspective and she does have some small aortic calcifications noted in the arch and small calcifications noted in the descending region.    Past Medical History:  Diagnosis Date   Acid reflux    Allergy history unknown    Asthma, mild intermittent    Barrett's esophagus    dx'd in [place]   Cancer ([place])    skin, breast (left)   Chronic headaches    Cyst of left kidney    mult-locular Dr.Dahlstedt    Hematuria    Dr. Zannie Cove   High cholesterol    History of cardiac arrhythmia    History of colon polyps    04/2009 [place] unknown histology   Hyperlipidemia    Hypertension    Hypothyroidism    Hypotonia    [name] 09/2012   IBS (irritable bowel syndrome)    Incontinence, feces    09/2012   Migraine headache    Osteopenia    Palpitations 12/01/2012   Echocardiogram - 7/13-normal EF, trace valvular lesions, mildly AI   PVC's (premature ventricular contractions)    Vertigo    Vestibular neuronitis    herpetic   Vocal cord polyps    sees DR. Cristina Gong    Past Surgical History:  Procedure Laterality Date   APPENDECTOMY  1964   BREAST LUMPECTOMY Left 2005   COLONOSCOPY  04/2009   OOPHORECTOMY  1968?   SKIN SURGERY  08/2012   TONSILECTOMY, ADENOIDECTOMY, BILATERAL MYRINGOTOMY AND TUBES  1961   TUBAL LIGATION      Current Medications: Current Meds  Medication Sig   alendronate (FOSAMAX) 70 MG tablet Take 70 mg by mouth once a week. Take with a full glass of water on an empty stomach.   atorvastatin (LIPITOR) 10 MG tablet Take 10 mg by mouth daily.   CALCIUM-PHOSPHORUS PO Take by mouth daily.   CARTIA XT 240 MG 24 hr capsule TAKE ONE CAPSULE BY MOUTH DAILY  diltiazem (CARDIZEM) 60 MG tablet Take 60 mg by mouth as needed.   FLUZONE HIGH-DOSE 0.5 ML injection TO BE ADMINISTERED BY PHARMACIST FOR IMMUNIZATION   hyoscyamine (NULEV) 0.125 MG TBDP disintergrating tablet Place 0.125 mg under the tongue every 4 (four) hours as needed (GI spasms).    levothyroxine (SYNTHROID, LEVOTHROID) 150 MCG tablet Take 150 mcg by mouth daily.   Multiple Vitamin (MULTIVITAMIN) tablet Take 1 tablet by mouth daily.    potassium chloride (K-DUR) 10 MEQ tablet Take 1 tablet (10 mEq total) by mouth daily.   vitamin C (ASCORBIC ACID) 500 MG tablet Take 500 mg by mouth daily.     Allergies:   Demerol [meperidine], Epinephrine, Gabapentin, and Quinine  derivatives   Social History   Socioeconomic History   Marital status: Single    Spouse name: Not on file   Number of children: 1   Years of education: Post Grad   Highest education level: Not on file  Occupational History   Occupation: Retired     Comment:  Warehouse manager strain: Not on file   Food insecurity    Worry: Not on file    Inability: Not on Lexicographer needs    Medical: Not on file    Non-medical: Not on file  Tobacco Use   Smoking status: Current Every Day Smoker    Packs/day: 1.50    Years: 50.00    Pack years: 75.00    Types: Cigarettes   Smokeless tobacco: Never Used  Substance and Sexual Activity   Alcohol use: Yes    Comment: rare   Drug use: No   Sexual activity: Not on file  Lifestyle   Physical activity    Days per week: Not on file    Minutes per session: Not on file   Stress: Not on file  Relationships   Social connections    Talks on phone: Not on file    Gets together: Not on file    Attends religious service: Not on file    Active member of club or organization: Not on file    Attends meetings of clubs or organizations: Not on file    Relationship status: Not on file  Other Topics Concern   Not on file  Social History Narrative   Lives at home with herself.   Caffeine use: 2 cups per day.   Right-handed     Family History: The patient's family history includes Bladder Cancer in her unknown relative; Cancer in her brother and father; Emphysema in her mother; Heart disease in her paternal grandfather.  ROS:   Please see the history of present illness.    Positive for back pain muscle pain dizziness bruising wheezing chest discomfort when laying down all other systems reviewed and are negative.  EKGs/Labs/Other Studies Reviewed:    The following studies were reviewed today: Prior office note EKG lab work reviewed  EKG:  EKG is  ordered today.  The ekg ordered today  demonstrates 11/26/2018-normal sinus rhythm 73 with possible left atrial enlargement personally reviewed-prior sinus rhythm 80 with possible left atrial enlargement no other abnormalities detected.  Recent Labs: 11/03/2018: ALT 19; BUN 20; Creatinine, Ser 0.92; Hemoglobin 13.9; Platelets 312.0; Potassium 3.6; Sodium 138  Recent Lipid Panel No results found for: CHOL, TRIG, HDL, CHOLHDL, VLDL, LDLCALC, LDLDIRECT  Physical Exam:    VS:  BP 138/60    Pulse 73    Ht 5\' 7"  (1.702 m)  Wt 155 lb (70.3 kg)    LMP  (LMP Unknown)    SpO2 96%    BMI 24.28 kg/m     Wt Readings from Last 3 Encounters:  11/26/18 155 lb (70.3 kg)  11/03/18 153 lb 3.2 oz (69.5 kg)  11/18/17 148 lb 6.4 oz (67.3 kg)     GEN:  Well nourished, well developed in no acute distress HEENT: Normal NECK: No JVD; No carotid bruits LYMPHATICS: No lymphadenopathy CARDIAC: RRR, no murmurs, rubs, gallops RESPIRATORY:  Clear to auscultation without rales, wheezing or rhonchi  ABDOMEN: Soft, non-tender, non-distended MUSCULOSKELETAL:  trace edema; No deformity  SKIN: Warm and dry NEUROLOGIC:  Alert and oriented x 3 PSYCHIATRIC:  Normal affect   ASSESSMENT:    1. Paroxysmal atrial fibrillation (HCC)   2. Essential hypertension   3. Hemoptysis    PLAN:    In order of problems listed above:  Paroxysmal atrial fibrillation - Once again encouraged use of anticoagulation in general given her chads vas score of 4, however she is currently being worked up for hemoptysis.  She does not wish to utilize and understands and accepts full responsibility for the possibility of stroke.  Worried previously about rectal bleeding from hemorrhoids.  Dr. Cristina Gong did say it was okay to utilize in the past. Diltiazem 240 QD.  Dilt 60 PRN. Uses 30's at times.  Today she is feeling well.  Previous days she has felt the palpitations.  History of chest pain/claudication - Lower extremity Dopplers and nuclear stress test were both reassuring in  the past.  Often when she is laying on her left side she may feel like clicking in her chest wall, could be musculoskeletal.  Essential hypertension -Currently reasonably controlled.  Aortic atherosclerosis -Currently on atorvastatin 10 mg.  She is not on aspirin or anticoagulation because of recent hemoptysis and prior refusal to take warfarin. -She is getting her lipids soon with Dr. Radene Ou.  I would like her LDL goal to be less than 70 given the atherosclerosis.  If she needs to increase her atorvastatin to a more high intensity dose such as 40 mg this would be reasonable.  She could also consider Crestor 20 mg as well.  Tobacco use -Continue to encourage cessation.  Medication Adjustments/Labs and Tests Ordered: Current medicines are reviewed at length with the patient today.  Concerns regarding medicines are outlined above.  Orders Placed This Encounter  Procedures   EKG 12-Lead   No orders of the defined types were placed in this encounter.   Patient Instructions  Medication Instructions:  The current medical regimen is effective;  continue present plan and medications.  *If you need a refill on your cardiac medications before your next appointment, please call your pharmacy*  Follow-Up: At [facility], you and your health needs are our priority.  As part of our continuing mission to provide you with exceptional heart care, we have created designated Provider Care Teams.  These Care Teams include your primary Cardiologist (physician) and Advanced Practice Providers (APPs -  Physician Assistants and Nurse Practitioners) who all work together to provide you with the care you need, when you need it.  Your next appointment:   12 months  The format for your next appointment:   In Person  Provider:   Candee Furbish, MD  Thank you for choosing [facility]!!        Signed, Candee Furbish, MD  11/26/2018 10:54 AM    [place]

## 2018-11-26 NOTE — Patient Instructions (Signed)
Medication Instructions:  The current medical regimen is effective;  continue present plan and medications.  *If you need a refill on your cardiac medications before your next appointment, please call your pharmacy*  Follow-Up: At [facility], you and your health needs are our priority.  As part of our continuing mission to provide you with exceptional heart care, we have created designated Provider Care Teams.  These Care Teams include your primary Cardiologist (physician) and Advanced Practice Providers (APPs -  Physician Assistants and Nurse Practitioners) who all work together to provide you with the care you need, when you need it.  Your next appointment:   12 month(s)  The format for your next appointment:   In Person  Provider:   Mark Skains, MD   Thank you for choosing [facility]!!     

## 2018-11-27 ENCOUNTER — Ambulatory Visit (HOSPITAL_COMMUNITY)
Admission: RE | Admit: 2018-11-27 | Discharge: 2018-11-27 | Disposition: A | Discharge status: Home / Self Care | Payer: Medicare Other | Source: Ambulatory Visit | Attending: Internal Medicine | Admitting: Internal Medicine

## 2018-11-27 DIAGNOSIS — I499 Cardiac arrhythmia, unspecified: Secondary | ICD-10-CM | POA: Diagnosis not present

## 2018-11-27 DIAGNOSIS — I050 Rheumatic mitral stenosis: Secondary | ICD-10-CM | POA: Insufficient documentation

## 2018-11-27 DIAGNOSIS — J449 Chronic obstructive pulmonary disease, unspecified: Secondary | ICD-10-CM | POA: Diagnosis not present

## 2018-11-27 DIAGNOSIS — I351 Nonrheumatic aortic (valve) insufficiency: Secondary | ICD-10-CM | POA: Insufficient documentation

## 2018-11-27 DIAGNOSIS — Z8679 Personal history of other diseases of the circulatory system: Secondary | ICD-10-CM

## 2018-11-27 NOTE — Progress Notes (Signed)
  Echocardiogram 2D Echocardiogram has been performed.  Johny Chess 11/27/2018, 9:40 AM

## 2018-12-02 ENCOUNTER — Other Ambulatory Visit: Payer: Self-pay | Admitting: Cardiology

## 2018-12-03 ENCOUNTER — Telehealth: Payer: Self-pay | Admitting: Cardiology

## 2018-12-03 DIAGNOSIS — E785 Hyperlipidemia, unspecified: Secondary | ICD-10-CM

## 2018-12-03 MED ORDER — ROSUVASTATIN CALCIUM 20 MG PO TABS: 20.0000 mg | ORAL_TABLET | Freq: Every day | ORAL | 3 refills | Status: DC

## 2018-12-03 NOTE — Telephone Encounter (Signed)
-----   Message from Jerline Pain, MD sent at 12/02/2018 11:06 AM EST ----- LDL still quite elevated 141. I would like to reduce this to less than 70 if possible given her atherosclerosis.  Lets stop her atorvastatin 10 mg a day and to start Crestor 20 mg a day for better overall lipid management.  Repeat lipid panel in 3 months and check ALT.  Candee Furbish, MD

## 2018-12-03 NOTE — Telephone Encounter (Signed)
New message    Please call with echo results. Also to discuss medication change (Crestor)

## 2018-12-03 NOTE — Telephone Encounter (Signed)
The patient has been notified of the result and recommendations. Crestor 20 mg QD sent to preferred pharmacy. LIPIDS and ALT scheudled for 03/05/19. Reviewed preliminary echo results with patient as this was ordered by Dr. Chase Caller. Patient verbalized understanding.  All questions (if any) were answered. Cleon Gustin, RN 12/03/2018 8:32 AM

## 2018-12-03 NOTE — Telephone Encounter (Deleted)
The patient has been notified of the result and recommendations. Crestor 20 mg QD sent to preferred pharmacy. LIPIDS and ALT scheudled for 03/05/19. Reviewed preliminary echo results with patient as this was ordered by Dr. Chase Caller. Patient verbalized understanding.  All questions (if any) were answered. Cleon Gustin, RN 12/03/2018 8:32 AM

## 2018-12-07 NOTE — Telephone Encounter (Signed)
Patient sent a MyChart message asking for the status of her follow up. She has completed her lab work last month and she had her echo on the 13th. She wants to know the results of her lab work and when she needs to follow up.   MR, please advise. Thanks.

## 2018-12-08 NOTE — Telephone Encounter (Signed)
Autoimmune blood work and echocardiogram normal.  Plan -CT scan of the chest without contrast for nodules and infiltrates and hemoptysis -Do the CT scan of the chest after mid December 2020 -Return to see myself or nurse practitioner but after the CT scan -Ideally she needs pulmonary function test as well but given the delays getting pulmonary function test we can do this based on the CT scan results and the follow-up visit

## 2018-12-09 ENCOUNTER — Other Ambulatory Visit: Payer: Self-pay | Admitting: General Surgery

## 2018-12-09 DIAGNOSIS — R918 Other nonspecific abnormal finding of lung field: Secondary | ICD-10-CM

## 2018-12-14 NOTE — Telephone Encounter (Signed)
Usually a level of 24 (< 14 is normal) in absence of other features is considered normal variant or significance not known. We can discuss at followup. If there are joint issues then she can consider seeing a rheumatologist

## 2019-01-04 ENCOUNTER — Ambulatory Visit
Admission: RE | Admit: 2019-01-04 | Discharge: 2019-01-04 | Disposition: A | Discharge status: Home / Self Care | Payer: Medicare Other | Source: Ambulatory Visit | Attending: Internal Medicine | Admitting: Internal Medicine

## 2019-01-04 DIAGNOSIS — R918 Other nonspecific abnormal finding of lung field: Secondary | ICD-10-CM

## 2019-01-11 ENCOUNTER — Encounter: Payer: Self-pay | Admitting: Internal Medicine

## 2019-01-11 ENCOUNTER — Ambulatory Visit (INDEPENDENT_AMBULATORY_CARE_PROVIDER_SITE_OTHER): Payer: Medicare Other | Admitting: Internal Medicine

## 2019-01-11 ENCOUNTER — Other Ambulatory Visit: Payer: Self-pay

## 2019-01-11 VITALS — BP 130/74 | HR 82 | Ht 67.0 in | Wt 156.8 lb

## 2019-01-11 DIAGNOSIS — R918 Other nonspecific abnormal finding of lung field: Secondary | ICD-10-CM | POA: Diagnosis not present

## 2019-01-11 DIAGNOSIS — J439 Emphysema, unspecified: Secondary | ICD-10-CM

## 2019-01-11 DIAGNOSIS — Z87448 Personal history of other diseases of urinary system: Secondary | ICD-10-CM

## 2019-01-11 MED ORDER — SPIRIVA RESPIMAT 1.25 MCG/ACT IN AERS: 2.0000 | INHALATION_SPRAY | Freq: Every day | RESPIRATORY_TRACT | 0 refills | Status: DC

## 2019-01-11 NOTE — Progress Notes (Signed)
HPI  IOV 11/03/2018  Chief Complaint  Patient presents with  . Consult     Bridget Mcdonald is 73 y.o. 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  She is a heavy smoker.  She has multiple complaints with the main reason for referral and the main complaint currently is 1 of hemoptysis ongoing for couple of months.  It is new in onset.  She believes she might have something autoimmune going on.  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  In addition for the last 2 or 3 years she has had dry skin in her lower extremities and the skin is flaky.  She states that the etiologic diagnosis for both these conditions is idiopathic.  Then approximately 2 at the end of August she started developing hemoptysis.  Since then it has persisted although the active frank blood is no longer present since October 04, 2018 or so.  She states the first episode of hemoptysis started August 27, 2018 and went through the whole weekend.  Then around September 06, 2018 she was in bed with symptoms ranging from coughing of blood clots fatigue, freezing cold, nausea and uncontrollable diarrhea and a sense that her atrial fibrillation was not under control.  The following week she did have right neck and ear pain with pressure pain in the left lower lateral lung in the lower part.  Fatigue also started.  She had a CT scan of the chest on September 9 that shows right upper lobe posterior segment groundglass opacities consistent with local alveolar hemorrhage in my personal visualization and opinion.  However she points to the left lower lobe infrascapular area as a point of discomfort.  In this area there is a small subpleural nodularity.  Then from mid September through October 10, 2018 she took Avelox she continued to have fatigue  and anxiety.  Around this timeframe hemoptysis resolved.  She says this is mainly because she is controlling her cough and clearing her throat.  But the chest pressure has persisted.  She feels the neuropathy has worsened.  She feels the same amount of edema as well.  She is also feels she is constantly hungry and rapidly gaining weight  She continues to smoke heavily.  Review of labs show nonbacterial microscopic hematuria in 2017.  She does not recollect taking antibiotics for this.  She had an echocardiogram in 2017 that was normal but she says she has a history of mitral valve prolapse.  She also has shortness of breath with exertion relieved by rest although walking desaturation test showed she did not desaturate.  She walked 185 feet x 3 laps in office with a resting pulse ox of 99% and the resting heart rate of 96/min.  When she finished she was pulse ox 100% and a heart rate of 117/min.  IMPRESSION: CT chest 1. Interlobular septal line thickening and ground-glass opacities in the medial right upper lobe. This is nonspecific, but can see be seen in pulmonary hemorrhage syndromes, pneumonia, and pulmonary alveolar proteinosis. 2. Multiple bilateral solid and ground-glass pulmonary nodules. Non-contrast chest CT at 3-6 months is recommended. If the nodules are stable at time of repeat CT, then future CT at 18-24 months (from today's scan) is considered optional for low-risk patients,  but is recommended for high-risk patients. This recommendation follows the consensus statement: Guidelines for Management of Incidental Pulmonary Nodules Detected on CT Images: From the Fleischner Society 2017; Radiology 2017; 284:228-243.  Aortic Atherosclerosis (ICD10-I70.0).   Electronically Signed   By: Zerita Boers M.D.   On: 09/24/2018 09:40    OV 01/11/2019  Subjective:  Patient ID: Bridget Mcdonald, female , DOB: 19-Aug-1945 , age 52 y.o. , MRN: [id number] , ADDRESS: [place] [address]   01/11/2019 -   Chief Complaint  Patient presents with  . Follow-up    Pt states she has been doing okay since last visit. Pt said she has been coughing up green-yellow phlegm which has been going on x4 days.     HPI Bridget Mcdonald 73 y.o. -presents for follow-up of review of her results.  Since her last visit in October she has not had any more hemoptysis.  She had a CT scan of the chest that I personally visualized.  The pulmonary infiltrate is resolved.  The nodules have improved.  She tells me that her smoking has relapsed.  There is a history of microscopic hematuria but her repeat UA shows only 3-6 RBCs per high-power field.  At this point in time she says that she has been isolating pretty well.  There is no clusters for COVID-19.  However for the last day or so she has had cough increase with yellow-green phlegm.  She thinks is because her smoking has relapsed.  She thinks if she quit smoking this will improve.  She is not keen on antibiotics of prednisone because of multiple different allergies.  We reviewed her pulmonary function test from 2014 and this shows isolated reduction diffusion capacity.  Her current CT chest in my personal visualization shows some emphysema although this is not reported in the official report.  She admits to anxiety particularly when she quit smoking.   IMPRESSION:  Lungs/Pleura: Resolution of the region of ground-glass opacity and pleural nodularity in the RIGHT upper lobe. There is minimal residual pleuroparenchymal nodularity measuring 16 mm x 9 mm decreased from 22 mm x 12 mm on prior (image 24/8).  Smaller scattered pulmonary nodules also are improved.  For example RIGHT lower lobe nodule measuring 5 mm (image 90/8) compares to 7 mm.  Several adjacent LEFT lower lobe nodules are also decreased. For example 2 mm nodule (image 80/8) decreased from 7 mm.  LEFT upper lobe nodule previously on image 36/3 has  resolved completely.  1. Near complete resolution of RIGHT upper lobe ground-glass opacity and pleural nodularity. Interval decrease in size of bilateral pulmonary nodules. Findings consistent with resolving infectious or inflammatory process. 2. Aortic Atherosclerosis (ICD10-I70.0).   Electronically Signed   By: Suzy Bouchard M.D.   On: 01/04/2019 08:13   Results for Bridget Mcdonald, Bridget Mcdonald (MRN [id number]) as of 01/11/2019 09:23  Ref. Range 11/03/2018 12:24  ANA Titer 1 Unknown Negative  ANCA Proteinase 3 Latest Ref Range: 0.0 - 3.5 U/mL <3.5  Angiotensin-Converting Enzyme Latest Ref Range: 9 - 67 U/L 20  Cyclic Citrullin Peptide Ab Latest Units: UNITS <16  dsDNA Ab Latest Ref Range: 0 - 9 IU/mL <1  ENA RNP Ab Latest Ref Range: 0.0 - 0.9 AI <0.2  ENA SSA (RO) Ab Latest Ref Range: 0.0 - 0.9 AI <0.2  ENA SSB (LA) Ab Latest Ref Range: 0.0 - 0.9 AI <0.2  Myeloperoxidase Abs Latest Units: AI <1.0  Serine Protease 3 Latest Units: AI <1.0  RA  Latex Turbid. Latest Ref Range: <14 IU/mL 24 (H)  ENA SM Ab Ser-aCnc Latest Ref Range: 0.0 - 0.9 AI <0.2  Scleroderma (Scl-70) (ENA) Antibody, IgG Latest Ref Range: 0.0 - 0.9 AI <0.2    Results for Bridget Mcdonald, Bridget Mcdonald (MRN [id number]) as of 01/11/2019 09:23  Ref. Range 11/03/2018 12:24  RBC / HPF Latest Ref Range: 0-2/hpf  3-6/hpf (A)  Results for Bridget Mcdonald, Bridget Mcdonald (MRN [id number]) as of 01/11/2019 09:23  Ref. Range 04/25/2015 15:49  RBC / HPF Latest Ref Range: 0 - 5 RBC/hpf TOO NUMEROUS TO COUNT    ROS - per HPI     has a past medical history of Acid reflux, Allergy history unknown, Asthma, mild intermittent, Barrett's esophagus, Cancer (HCC), Chronic headaches, Cyst of left kidney, Hematuria, High cholesterol, History of cardiac arrhythmia, History of colon polyps, Hyperlipidemia, Hypertension, Hypothyroidism, Hypotonia, IBS (irritable bowel syndrome), Incontinence, feces, Migraine headache, Osteopenia, Palpitations (12/01/2012), PVC's  (premature ventricular contractions), Vertigo, Vestibular neuronitis, and Vocal cord polyps.   reports that she has been smoking cigarettes. She has a 75.00 pack-year smoking history. She has never used smokeless tobacco.  Past Surgical History:  Procedure Laterality Date  . APPENDECTOMY  1964  . BREAST LUMPECTOMY Left 2005  . COLONOSCOPY  04/2009  . OOPHORECTOMY  1968?  Marland Kitchen SKIN SURGERY  08/2012  . TONSILECTOMY, ADENOIDECTOMY, BILATERAL MYRINGOTOMY AND TUBES  1961  . TUBAL LIGATION      Allergies  Allergen Reactions  . Demerol [Meperidine]     hallucinations  . Epinephrine     Super hyper  . Gabapentin Diarrhea  . Quinine Derivatives     deaf    Immunization History  Administered Date(s) Administered  . Fluad Quad(high Dose 65+) 10/15/2018  . Influenza Split 10/15/2010  . Pneumococcal Conjugate-13 08/14/2017  . Pneumococcal Polysaccharide-23 01/14/2009    Family History  Problem Relation Age of Onset  . Emphysema Mother   . Cancer Father        prostate  . Heart disease Paternal Grandfather   . Cancer Brother        prostate  . Bladder Cancer Unknown      Current Outpatient Medications:  .  alendronate (FOSAMAX) 70 MG tablet, Take 70 mg by mouth once a week. Take with a full glass of water on an empty stomach., Disp: , Rfl:  .  CALCIUM-PHOSPHORUS PO, Take by mouth daily., Disp: , Rfl:  .  CARTIA XT 240 MG 24 hr capsule, TAKE ONE CAPSULE BY MOUTH DAILY, Disp: 90 capsule, Rfl: 2 .  diltiazem (CARDIZEM) 60 MG tablet, Take 60 mg by mouth as needed., Disp: , Rfl:  .  hyoscyamine (NULEV) 0.125 MG TBDP disintergrating tablet, Place 0.125 mg under the tongue every 4 (four) hours as needed (GI spasms). , Disp: , Rfl:  .  levothyroxine (SYNTHROID) 175 MCG tablet, Take 175 mcg by mouth daily., Disp: , Rfl:  .  Multiple Vitamin (MULTIVITAMIN) tablet, Take 1 tablet by mouth daily. , Disp: , Rfl:  .  potassium chloride (KLOR-CON) 10 MEQ tablet, TAKE ONE TABLET BY MOUTH DAILY,  Disp: 90 tablet, Rfl: 3 .  rosuvastatin (CRESTOR) 20 MG tablet, Take 1 tablet (20 mg total) by mouth daily., Disp: 90 tablet, Rfl: 3 .  vitamin C (ASCORBIC ACID) 500 MG tablet, Take 500 mg by mouth daily., Disp: , Rfl:  .  Tiotropium Bromide Monohydrate (SPIRIVA RESPIMAT) 1.25 MCG/ACT AERS, Inhale 2 puffs into the lungs daily., Disp: 4 g, Rfl: 0  Objective:   Vitals:   01/11/19 0915  BP: 130/74  Pulse: 82  SpO2: 98%  Weight: 156 lb 12.8 oz (71.1 kg)  Height: 5\' 7"  (1.702 m)    Estimated body mass index is 24.56 kg/m as calculated from the following:   Height as of this encounter: 5\' 7"  (1.702 m).   Weight as of this encounter: 156 lb 12.8 oz (71.1 kg).  @WEIGHTCHANGE @  [employer]   01/11/19 0915  Weight: 156 lb 12.8 oz (71.1 kg)     Physical Exam  General Appearance:    Alert, cooperative, no distress, appears stated age - yes , Deconditioned looking - no , OBESE  - no, Sitting on Wheelchair -  no  Head:    Normocephalic, without obvious abnormality, atraumatic  Eyes:    PERRL, conjunctiva/corneas clear,  Ears:    Normal TM's and external ear canals, both ears  Nose:   Nares normal, septum midline, mucosa normal, no drainage    or sinus tenderness. OXYGEN ON  - no . Patient is @ ra   Throat:   Lips, mucosa, and tongue normal; teeth and gums normal. Cyanosis on lips - no  Neck:   Supple, symmetrical, trachea midline, no adenopathy;    thyroid:  no enlargement/tenderness/nodules; no carotid   bruit or JVD  Back:     Symmetric, no curvature, ROM normal, no CVA tenderness  Lungs:     Distress - no , Wheeze no, Barrell Chest - no, Purse lip breathing - no, Crackles - no   Chest Wall:    No tenderness or deformity.    Heart:    Regular rate and rhythm, S1 and S2 normal, no rub   or gallop, Murmur - no  Breast Exam:    NOT DONE  Abdomen:     Soft, non-tender, bowel sounds active all four quadrants,    no masses, no organomegaly. Visceral obesity - no  Genitalia:    NOT DONE  Rectal:   NOT DONE  Extremities:   Extremities - normal, Has Cane - no, Clubbing - no, Edema - no  Pulses:   2+ and symmetric all extremities  Skin:   Stigmata of Connective Tissue Disease - no  Lymph nodes:   Cervical, supraclavicular, and axillary nodes normal  Psychiatric:  Neurologic:   Pleasant - yes, Anxious - no, Flat affect - no  CAm-ICU - neg, Alert and Oriented x 3 - yes, Moves all 4s - yes, Speech - normal, Cognition - intact           Assessment:       ICD-10-CM   1. Pulmonary infiltrates  R91.8   2. Pulmonary nodules  R91.8   3. History of hematuria  Z87.448   4. Pulmonary emphysema, unspecified emphysema type ([place])  J43.9        Plan:     Patient Instructions  Pulmonary infiltrates Pulmonary nodules  - improved on CT Jan 04, 2019. Do not see pericardial effusion on this CT  Plan  - repeat CT chest without contrast  In 6-9 months  History Smoking  - please work on quitting  - we can discuss strategies next visit  Pulmonary emphysema, unspecified emphysema type ([place])  - seen on CT - 2014 PFt with moderate reduction in diffusion - there might be mild copd flare up (low odds for covid-19)  Plan - we agreed to monitor your copd flare up  - start spiriva 2 puff once daily   -  price this (for now take sample). Alternatives is incruse - start albuterol as needed - Please talk to PCP [facility], Bill Salinas, MD -  and ensure you get  shingrix ([place]) inactivated vaccine against shingles - COvid-19 vaccine - discuss your allergy history before taking this vaccine - there is a caution on this for people with allergy his       SIGNATURE    Dr. Brand Males, M.D., F.C.C.P,  Pulmonary and Critical Care Medicine Staff Physician, [place] Director - Interstitial Lung Disease  Program  [facility] [place] [facility] [place], [place], [address]  Pager: [phone], If no answer or between   15:00h - 7:00h: call [phone] Telephone: [phone]  9:46 AM 01/11/2019

## 2019-01-11 NOTE — Addendum Note (Signed)
Addended by: Lorretta Harp on: 01/11/2019 09:56 AM   Modules accepted: Orders

## 2019-01-11 NOTE — Patient Instructions (Addendum)
Pulmonary infiltrates Pulmonary nodules  - improved on CT Jan 04, 2019. Do not see pericardial effusion on this CT  Plan  - repeat CT chest without contrast  In 6-9 months  History Smoking  - please work on quitting  - we can discuss strategies next visit  Pulmonary emphysema, unspecified emphysema type ([place])  - seen on CT - 2014 PFt with moderate reduction in diffusion - there might be mild copd flare up (low odds for covid-19)  Plan - we agreed to monitor your copd flare up  - start spiriva 2 puff once daily   - price this (for now take sample). Alternatives is incruse - start albuterol as needed - Please talk to PCP Rankins, Bridget Salinas, MD -  and ensure you get  shingrix ([place]) inactivated vaccine against shingles - COvid-19 vaccine - discuss your allergy history before taking this vaccine - there is a caution on this for people with allergy his   Followup  - 3-6 months or sooner if needed - CT chest in 6-9 months

## 2019-02-04 DIAGNOSIS — E039 Hypothyroidism, unspecified: Secondary | ICD-10-CM | POA: Diagnosis not present

## 2019-03-01 ENCOUNTER — Other Ambulatory Visit: Payer: Self-pay | Admitting: Physician Assistant

## 2019-03-01 ENCOUNTER — Ambulatory Visit
Admission: RE | Admit: 2019-03-01 | Discharge: 2019-03-01 | Disposition: A | Discharge status: Home / Self Care | Payer: Medicare Other | Source: Ambulatory Visit | Attending: Physician Assistant | Admitting: Physician Assistant

## 2019-03-01 DIAGNOSIS — R1010 Upper abdominal pain, unspecified: Secondary | ICD-10-CM | POA: Diagnosis not present

## 2019-03-01 DIAGNOSIS — K5900 Constipation, unspecified: Secondary | ICD-10-CM | POA: Diagnosis not present

## 2019-03-01 DIAGNOSIS — K589 Irritable bowel syndrome without diarrhea: Secondary | ICD-10-CM | POA: Diagnosis not present

## 2019-03-01 DIAGNOSIS — K219 Gastro-esophageal reflux disease without esophagitis: Secondary | ICD-10-CM | POA: Diagnosis not present

## 2019-03-05 ENCOUNTER — Other Ambulatory Visit: Payer: Medicare Other

## 2019-03-06 ENCOUNTER — Ambulatory Visit: Payer: Medicare Other

## 2019-03-11 ENCOUNTER — Other Ambulatory Visit: Payer: Medicare Other

## 2019-03-14 ENCOUNTER — Ambulatory Visit: Payer: Medicare Other

## 2019-03-17 ENCOUNTER — Other Ambulatory Visit: Payer: Medicare Other | Admitting: *Deleted

## 2019-03-17 ENCOUNTER — Other Ambulatory Visit: Payer: Self-pay | Admitting: Physician Assistant

## 2019-03-17 ENCOUNTER — Other Ambulatory Visit: Payer: Self-pay

## 2019-03-17 DIAGNOSIS — R1011 Right upper quadrant pain: Secondary | ICD-10-CM

## 2019-03-17 DIAGNOSIS — E785 Hyperlipidemia, unspecified: Secondary | ICD-10-CM

## 2019-03-17 DIAGNOSIS — R935 Abnormal findings on diagnostic imaging of other abdominal regions, including retroperitoneum: Secondary | ICD-10-CM

## 2019-03-18 LAB — LIPID PANEL
Chol/HDL Ratio: 2.7 ratio (ref 0.0–4.4)
Cholesterol, Total: 124 mg/dL (ref 100–199)
HDL: 46 mg/dL (ref >39)
LDL Chol Calc (NIH): 57 mg/dL (ref 0–99)
Triglycerides: 118 mg/dL (ref 0–149)
VLDL Cholesterol Cal: 21 mg/dL (ref 5–40)

## 2019-03-18 LAB — ALT: ALT: 17 IU/L (ref 0–32)

## 2019-03-19 ENCOUNTER — Telehealth: Payer: Self-pay | Admitting: Cardiology

## 2019-03-19 ENCOUNTER — Telehealth: Payer: Self-pay | Admitting: Internal Medicine

## 2019-03-19 DIAGNOSIS — J439 Emphysema, unspecified: Secondary | ICD-10-CM

## 2019-03-19 NOTE — Telephone Encounter (Signed)
Attempted to call pt but unable to reach. Left message for her to return call. 

## 2019-03-19 NOTE — Telephone Encounter (Signed)
Attempted to call back Anderson Malta at the number that was provided. I was told by their pharmacist that a Anderson Malta does not work there and they do not have a patient by the name of Kelce Rideaux.

## 2019-03-19 NOTE — Telephone Encounter (Signed)
New Message:   She wants to know if pt is still taking Potassium?

## 2019-03-22 NOTE — Telephone Encounter (Signed)
LMTCB on voicemail. Asking patient to call insurance and see what the covered alternatives are and if she would have a copay.  Will await call back from patient.

## 2019-03-23 NOTE — Telephone Encounter (Signed)
Left detailed message to call her insurance to see what the covered alternatives are to the Spiriva and then to call us back.

## 2019-03-23 NOTE — Telephone Encounter (Signed)
Patient is returning phone call. Patient phone number is [phone]. 

## 2019-03-23 NOTE — Telephone Encounter (Signed)
May leave detailed message on voicemail. 

## 2019-03-24 ENCOUNTER — Telehealth: Payer: Self-pay | Admitting: Cardiology

## 2019-03-24 DIAGNOSIS — Z79899 Other long term (current) drug therapy: Secondary | ICD-10-CM

## 2019-03-24 DIAGNOSIS — I10 Essential (primary) hypertension: Secondary | ICD-10-CM

## 2019-03-24 NOTE — Telephone Encounter (Signed)
New Message:      Bridget Mcdonald from [facility], wants to know if pt is still on Potassium?

## 2019-03-24 NOTE — Telephone Encounter (Signed)
lpmtcb 3/10 

## 2019-03-25 NOTE — Telephone Encounter (Signed)
Mailed Potassium question to patient. 3/11

## 2019-03-25 NOTE — Telephone Encounter (Signed)
lmtcb for pt.  

## 2019-03-25 NOTE — Telephone Encounter (Signed)
lpmtcb 3/11

## 2019-03-26 MED ORDER — ALBUTEROL SULFATE HFA 108 (90 BASE) MCG/ACT IN AERS: 2.0000 | INHALATION_SPRAY | Freq: Four times a day (QID) | RESPIRATORY_TRACT | 2 refills | Status: AC | PRN

## 2019-03-26 NOTE — Telephone Encounter (Signed)
Pt returning a phone call. Pt can be reached at [phone]. Pt will be unavailable after 10 am. Pt said that her insurance covers incruse but that cost $369. Pt said that MR wanted to continue the albuterol and she does not any. [facility] [address].

## 2019-03-26 NOTE — Telephone Encounter (Signed)
Ok to do albuterol

## 2019-03-26 NOTE — Telephone Encounter (Signed)
Patient states she is returning Michael's call.

## 2019-03-26 NOTE — Telephone Encounter (Signed)
Left message for pt as requested.  Advised I will order the BMP if she can call back into the office to schedule the lab work in 1 month.

## 2019-03-26 NOTE — Telephone Encounter (Addendum)
MR, please advise on message from pt. Thanks!Marland Kitchen

## 2019-03-26 NOTE — Telephone Encounter (Signed)
Left detailed message on named VM stating that albuterol was sent to pharmacy.  Nothing further needed at this time- will close encounter.

## 2019-03-26 NOTE — Telephone Encounter (Signed)
Spoke with patient.  She reports she is taking taking potassium chloride because it helps with her leg cramps.  She is not taking a diuretic (furosemide) at this point because it was causing diarrhea.  Pt would like to know if it is OK with Dr Marlou Porch for her to continue taking the KCL because her levels have been low in the past and if does help cramps.  Advised I will have MD to review and call back with that info.  She request I leave a message on her voicemail if she does not answer.   Also advised if she receives a letter/note from [facility] she can disgard it.  Pt does NOT use [facility] and does NOT want for me to call Anderson Malta back to notify them if she is taking KCL or not.  [facility] as listed in her chart.

## 2019-03-26 NOTE — Telephone Encounter (Signed)
Rx for albuterol sent [facility]. Since it is after 10, will try to call pt later to inform.

## 2019-03-26 NOTE — Telephone Encounter (Signed)
OK with her taking KCL supplement alone without lasix.  Let's just check her BMET again in one month.   Candee Furbish, MD

## 2019-03-29 ENCOUNTER — Inpatient Hospital Stay: Admission: RE | Admit: 2019-03-29 | Payer: Medicare Other | Source: Ambulatory Visit

## 2019-03-30 ENCOUNTER — Telehealth: Payer: Self-pay | Admitting: Cardiology

## 2019-03-30 DIAGNOSIS — E039 Hypothyroidism, unspecified: Secondary | ICD-10-CM | POA: Diagnosis not present

## 2019-03-30 DIAGNOSIS — E7800 Pure hypercholesterolemia, unspecified: Secondary | ICD-10-CM | POA: Diagnosis not present

## 2019-03-30 DIAGNOSIS — I480 Paroxysmal atrial fibrillation: Secondary | ICD-10-CM | POA: Diagnosis not present

## 2019-03-30 DIAGNOSIS — D62 Acute posthemorrhagic anemia: Secondary | ICD-10-CM | POA: Diagnosis not present

## 2019-03-30 DIAGNOSIS — M810 Age-related osteoporosis without current pathological fracture: Secondary | ICD-10-CM | POA: Diagnosis not present

## 2019-03-30 NOTE — Telephone Encounter (Signed)
New message  Per [facility] would like to know if patient is still taking furosemide  and potassium? Please advise

## 2019-03-30 NOTE — Telephone Encounter (Signed)
Called Anderson Malta back with [facility]. Informed her that patient is not taking furosemide, but she is suppose to still take Potassium 10 meq daily. Anderson Malta verbalized understanding.

## 2019-03-31 ENCOUNTER — Inpatient Hospital Stay: Admission: RE | Admit: 2019-03-31 | Payer: Medicare Other | Source: Ambulatory Visit

## 2019-04-08 ENCOUNTER — Ambulatory Visit: Payer: Medicare Other | Attending: Internal Medicine

## 2019-04-08 DIAGNOSIS — Z23 Encounter for immunization: Secondary | ICD-10-CM

## 2019-04-08 NOTE — Progress Notes (Signed)
   Covid-19 Vaccination Clinic  Name:  Bridget Mcdonald    MRN: [id number] DOB: 07-06-1945  04/08/2019  [name] was observed post Covid-19 immunization for 15 minutes without incident. She was provided with Vaccine Information Sheet and instruction to access the V-Safe system.   [name] was instructed to call 911 with any severe reactions post vaccine: Marland Kitchen Difficulty breathing  . Swelling of face and throat  . A fast heartbeat  . A bad rash all over body  . Dizziness and weakness   Immunizations Administered    Name Date Dose VIS Date Route   Pfizer COVID-19 Vaccine 04/08/2019  8:56 AM 0.3 mL 12/25/2018 Intramuscular   Manufacturer: [place]   Lot: [id number]   [place]: [id number]

## 2019-04-12 ENCOUNTER — Other Ambulatory Visit: Payer: Medicare Other

## 2019-04-12 ENCOUNTER — Telehealth: Payer: Self-pay | Admitting: Cardiology

## 2019-04-12 NOTE — Telephone Encounter (Signed)
OK to change to diltiazem 60mg  PO 4 times a day Candee Furbish, MD

## 2019-04-12 NOTE — Telephone Encounter (Signed)
Called patient and reviewed Dr. Marlou Porch' advice. She asks what she should do about the dose that would be needed in the middle of the night. She thinks she will be able to get her new Rx for Cartia 240 mg in the next 2 days. I advised that she could take a dose of 60 mg just prior to bedtime and see if she is able to sleep through the night. I advised that if she wakes up and needs an additional dose that hopefully this schedule will be short-lived. She verbalized understanding and agreement and thanked me for the call.

## 2019-04-12 NOTE — Telephone Encounter (Signed)
Will forward to Dr Marlou Porch for review .Bridget Mcdonald

## 2019-04-12 NOTE — Telephone Encounter (Signed)
Pt c/o medication issue:  1. Name of Medication: CARTIA XT 240 MG 24 hr capsule  2. How are you currently taking this medication (dosage and times per day)? Out of medication  3. Are you having a reaction (difficulty breathing--STAT)? No  4. What is your medication issue? Patient states that her pharmacy is out of this medication for the time being. She took one diltiazem (CARDIZEM) 60 MG tablet and states that she can take one every 6 hours to equal 240mg . She wants to know if this is something she should be doing or what does Dr. Marlou Porch recommend she do until she can get her medication. She said hopefully the pharmacy will get the prescription in today, she is unable to pick up samples (if we have any) at the office.

## 2019-04-21 DIAGNOSIS — K5900 Constipation, unspecified: Secondary | ICD-10-CM | POA: Diagnosis not present

## 2019-04-21 DIAGNOSIS — K589 Irritable bowel syndrome without diarrhea: Secondary | ICD-10-CM | POA: Diagnosis not present

## 2019-04-21 DIAGNOSIS — K219 Gastro-esophageal reflux disease without esophagitis: Secondary | ICD-10-CM | POA: Diagnosis not present

## 2019-04-28 ENCOUNTER — Ambulatory Visit: Payer: Medicare Other | Admitting: Internal Medicine

## 2019-04-29 ENCOUNTER — Other Ambulatory Visit: Payer: Medicare Other

## 2019-05-03 ENCOUNTER — Other Ambulatory Visit: Payer: Medicare Other

## 2019-05-04 ENCOUNTER — Other Ambulatory Visit: Payer: Self-pay

## 2019-05-04 ENCOUNTER — Other Ambulatory Visit: Payer: Medicare Other

## 2019-05-04 ENCOUNTER — Ambulatory Visit: Payer: Medicare Other | Attending: Internal Medicine

## 2019-05-04 DIAGNOSIS — Z79899 Other long term (current) drug therapy: Secondary | ICD-10-CM

## 2019-05-04 DIAGNOSIS — Z23 Encounter for immunization: Secondary | ICD-10-CM

## 2019-05-04 DIAGNOSIS — I10 Essential (primary) hypertension: Secondary | ICD-10-CM | POA: Diagnosis not present

## 2019-05-04 LAB — BASIC METABOLIC PANEL
BUN/Creatinine Ratio: 22 (ref 12–28)
BUN: 14 mg/dL (ref 8–27)
CO2: 26 mmol/L (ref 20–29)
Calcium: 9.5 mg/dL (ref 8.7–10.3)
Chloride: 102 mmol/L (ref 96–106)
Creatinine, Ser: 0.63 mg/dL (ref 0.57–1.00)
GFR calc Af Amer: 102 mL/min/{1.73_m2} (ref >59)
GFR calc non Af Amer: 89 mL/min/{1.73_m2} (ref >59)
Glucose: 109 mg/dL — ABNORMAL HIGH (ref 65–99)
Potassium: 4.2 mmol/L (ref 3.5–5.2)
Sodium: 140 mmol/L (ref 134–144)

## 2019-05-04 NOTE — Progress Notes (Signed)
   Covid-19 Vaccination Clinic  Name:  Bridget Mcdonald    MRN: [id number] DOB: [birth year]  05/04/2019  Bridget Mcdonald was observed post Covid-19 immunization for 15 minutes without incident. She was provided with Vaccine Information Sheet and instruction to access the V-Safe system.   Bridget Mcdonald was instructed to call 911 with any severe reactions post vaccine: Marland Kitchen Difficulty breathing  . Swelling of face and throat  . A fast heartbeat  . A bad rash all over body  . Dizziness and weakness   Immunizations Administered    Name Date Dose VIS Date Route   Pfizer COVID-19 Vaccine 05/04/2019  8:05 AM 0.3 mL 03/10/2018 Intramuscular   Manufacturer: [place]   Lot: [id number]   [place]: [id number]

## 2019-05-08 ENCOUNTER — Other Ambulatory Visit: Payer: Self-pay | Admitting: Cardiology

## 2019-05-10 ENCOUNTER — Telehealth: Payer: Self-pay

## 2019-05-10 ENCOUNTER — Telehealth: Payer: Self-pay | Admitting: Cardiology

## 2019-05-10 DIAGNOSIS — E7800 Pure hypercholesterolemia, unspecified: Secondary | ICD-10-CM | POA: Diagnosis not present

## 2019-05-10 DIAGNOSIS — E039 Hypothyroidism, unspecified: Secondary | ICD-10-CM | POA: Diagnosis not present

## 2019-05-10 DIAGNOSIS — I480 Paroxysmal atrial fibrillation: Secondary | ICD-10-CM | POA: Diagnosis not present

## 2019-05-10 DIAGNOSIS — M810 Age-related osteoporosis without current pathological fracture: Secondary | ICD-10-CM | POA: Diagnosis not present

## 2019-05-10 DIAGNOSIS — D62 Acute posthemorrhagic anemia: Secondary | ICD-10-CM | POA: Diagnosis not present

## 2019-05-10 NOTE — Telephone Encounter (Signed)
Pt c/o medication issue:  1. Name of Medication: Trazadone 50mg   2. How are you currently taking this medication (dosage and times per day)? N/a   3. Are you having a reaction (difficulty breathing--STAT)? no  4. What is your medication issue? Cyril Mourning, PharmD with patient's PCP office is calling about starting the patient on this medication. She states it would be 0.5 tablet at bedtime. Please advise. She is aware that Dr. Marlou Porch and his nurse are both off today.

## 2019-05-10 NOTE — Telephone Encounter (Signed)
Spoke with a receptionist at pts PCP when trying to contact [facility] to confirm a medication dose. Left a message with the receptionist to tell PharmD to call back when its convenient.

## 2019-05-10 NOTE — Telephone Encounter (Signed)
Spoke with Bridget Mcdonald [phone]  PharmD from patients PCP office who is wanting to start the patient on Trazadone 25mg  daily at bedtime due to the patient having trouble sleeping. The patient was concerned about how this could potentially effect her heart so Cyril Mourning would like Dr. Marlou Porch advisement on if it is ok for the patient to begin taking this medication.

## 2019-05-10 NOTE — Telephone Encounter (Signed)
Pt takes 60mg  every 6 hours

## 2019-05-11 NOTE — Telephone Encounter (Signed)
OK to take Trazadone. Candee Furbish, MD

## 2019-05-11 NOTE — Telephone Encounter (Signed)
Called and left VM (OK per DPR) ok to take trazadone as rxed.  Requested pt c/b with any further questions/concerns.

## 2019-05-27 DIAGNOSIS — I480 Paroxysmal atrial fibrillation: Secondary | ICD-10-CM | POA: Diagnosis not present

## 2019-05-27 DIAGNOSIS — M810 Age-related osteoporosis without current pathological fracture: Secondary | ICD-10-CM | POA: Diagnosis not present

## 2019-05-27 DIAGNOSIS — E039 Hypothyroidism, unspecified: Secondary | ICD-10-CM | POA: Diagnosis not present

## 2019-05-27 DIAGNOSIS — D62 Acute posthemorrhagic anemia: Secondary | ICD-10-CM | POA: Diagnosis not present

## 2019-05-27 DIAGNOSIS — E7800 Pure hypercholesterolemia, unspecified: Secondary | ICD-10-CM | POA: Diagnosis not present

## 2019-06-07 ENCOUNTER — Ambulatory Visit: Payer: Medicare Other | Admitting: Internal Medicine

## 2019-06-18 DIAGNOSIS — E039 Hypothyroidism, unspecified: Secondary | ICD-10-CM | POA: Diagnosis not present

## 2019-06-18 DIAGNOSIS — M810 Age-related osteoporosis without current pathological fracture: Secondary | ICD-10-CM | POA: Diagnosis not present

## 2019-06-18 DIAGNOSIS — I480 Paroxysmal atrial fibrillation: Secondary | ICD-10-CM | POA: Diagnosis not present

## 2019-06-18 DIAGNOSIS — D62 Acute posthemorrhagic anemia: Secondary | ICD-10-CM | POA: Diagnosis not present

## 2019-06-18 DIAGNOSIS — E7800 Pure hypercholesterolemia, unspecified: Secondary | ICD-10-CM | POA: Diagnosis not present

## 2019-06-23 DIAGNOSIS — E0590 Thyrotoxicosis, unspecified without thyrotoxic crisis or storm: Secondary | ICD-10-CM | POA: Diagnosis not present

## 2019-07-26 ENCOUNTER — Telehealth: Payer: Self-pay | Admitting: Cardiology

## 2019-07-26 NOTE — Telephone Encounter (Signed)
   *  STAT* If patient is at the pharmacy, call can be transferred to refill team.   1. Which medications need to be refilled? (please list name of each medication and dose if known)   diltiazem (CARDIZEM CD) 240 MG 24 hr capsule    2. Which pharmacy/location (including street and city if local pharmacy) is medication to be sent to? [place], [place]  3. Do they need a 30 day or 90 day supply? 90 days

## 2019-07-27 ENCOUNTER — Other Ambulatory Visit: Payer: Medicare Other

## 2019-07-27 MED ORDER — DILTIAZEM HCL ER COATED BEADS 240 MG PO CP24: 240.0000 mg | ORAL_CAPSULE | Freq: Every day | ORAL | 1 refills | Status: DC

## 2019-07-27 NOTE — Telephone Encounter (Signed)
Pt's medication was sent to pt's pharmacy as requested. Confirmation received.  °

## 2019-07-28 DIAGNOSIS — E039 Hypothyroidism, unspecified: Secondary | ICD-10-CM | POA: Diagnosis not present

## 2019-07-28 DIAGNOSIS — E7800 Pure hypercholesterolemia, unspecified: Secondary | ICD-10-CM | POA: Diagnosis not present

## 2019-07-28 DIAGNOSIS — M810 Age-related osteoporosis without current pathological fracture: Secondary | ICD-10-CM | POA: Diagnosis not present

## 2019-07-28 DIAGNOSIS — D62 Acute posthemorrhagic anemia: Secondary | ICD-10-CM | POA: Diagnosis not present

## 2019-07-28 DIAGNOSIS — I480 Paroxysmal atrial fibrillation: Secondary | ICD-10-CM | POA: Diagnosis not present

## 2019-08-09 ENCOUNTER — Ambulatory Visit
Admission: RE | Admit: 2019-08-09 | Discharge: 2019-08-09 | Disposition: A | Discharge status: Home / Self Care | Payer: Medicare Other | Source: Ambulatory Visit | Attending: Internal Medicine | Admitting: Internal Medicine

## 2019-08-09 ENCOUNTER — Other Ambulatory Visit: Payer: Medicare Other

## 2019-08-09 ENCOUNTER — Other Ambulatory Visit: Payer: Self-pay | Admitting: Cardiology

## 2019-08-09 DIAGNOSIS — R918 Other nonspecific abnormal finding of lung field: Secondary | ICD-10-CM | POA: Diagnosis not present

## 2019-08-09 DIAGNOSIS — J432 Centrilobular emphysema: Secondary | ICD-10-CM | POA: Diagnosis not present

## 2019-08-11 NOTE — Telephone Encounter (Signed)
   The new nodules are so small that oddds of being cancer is very very low. And there are no tools to diagnosd them. Hence the 12 months followup advise which I think is correct. If she is concerned we can repeat one in 9 months or sooner if needed  Thanks    SIGNATURE    Dr. Brand Males, M.D., F.C.C.P,  Pulmonary and Critical Care Medicine Staff Physician, [place] Director - Interstitial Lung Disease  Program  Pulmonary [place] at [place], [place], [address]  Pager: [phone], If no answer  OR between  19:00-7:00h: page [phone] Telephone (clinical office): [phone] Telephone (research): [phone]  4:02 PM 08/11/2019      [id number] CLINICAL DATA:  Follow-up of pulmonary nodules on prior chest CT. Left breast cancer with remote lumpectomy.  EXAM: CT CHEST WITHOUT CONTRAST  TECHNIQUE: Multidetector CT imaging of the chest was performed following the standard protocol without IV contrast.  COMPARISON:  01/04/2019  FINDINGS: Cardiovascular: Aortic atherosclerosis. Normal heart size, without pericardial effusion. Lad coronary artery calcification.  Mediastinum/Nodes: No mediastinal or definite hilar adenopathy, given limitations of unenhanced CT.  Lungs/Pleura: No pleural fluid. Mild centrilobular emphysema. A nodule along the right minor fissure of 4 mm is new on 106/8 and is most likely a subpleural lymph node.  3 mm right lower lobe pulmonary nodule on 107/8 is also new.  Mild lingular volume loss is not significantly changed.  Decrease in interstitial thickening within the medial right apex, including on 30/8. This is consistent with post infectious scarring. More laterally, in the right upper lobe, there is minimal interstitial thickening including on 55/8 which is similar and also likely post infectious or inflammatory.  Upper Abdomen:  High right hepatic lobe cyst. Normal imaged portions of the spleen, stomach, pancreas. Similar adrenal thickening with maintenance of adreniform shape, suggesting hyperplasia. Normal imaged right kidney. Multi septated, partially calcified upper pole left renal lesion is decreased since the prior exam. Maximally 5.4 cm today versus 7.6 cm on the prior exam (when remeasured). This is present back to 04/25/2015.  Musculoskeletal: Remote lower right rib trauma.  IMPRESSION: 1. Further improvement in previously described post infectious/inflammatory interstitial thickening, consistent with scarring. 2. New right-sided pulmonary nodules x2, 3 maximally 4 mm. Non-contrast chest CT can be considered in 12 months, given risk factors for primary bronchogenic carcinoma. This recommendation follows the consensus statement: Guidelines for Management of Incidental Pulmonary Nodules Detected on CT Images: From the Fleischner Society 2017; Radiology 2017; 284:228-243. 3. Aortic atherosclerosis (ICD10-I70.0), coronary artery atherosclerosis and emphysema (ICD10-J43.9). 4. Incompletely imaged upper pole left renal lesion, decreased in size and present back to 2017, presumably a complex cyst.   Electronically Signed   By: Abigail Miyamoto M.D.   On: 08/09/2019 20:13

## 2019-08-11 NOTE — Telephone Encounter (Signed)
Dr. Chase Caller, The patient is requesting results of her CT scan.  Please review and advise.  Thank you.

## 2019-09-08 ENCOUNTER — Telehealth: Payer: Self-pay | Admitting: Internal Medicine

## 2019-09-08 DIAGNOSIS — M810 Age-related osteoporosis without current pathological fracture: Secondary | ICD-10-CM | POA: Diagnosis not present

## 2019-09-08 DIAGNOSIS — I480 Paroxysmal atrial fibrillation: Secondary | ICD-10-CM | POA: Diagnosis not present

## 2019-09-08 DIAGNOSIS — D62 Acute posthemorrhagic anemia: Secondary | ICD-10-CM | POA: Diagnosis not present

## 2019-09-08 DIAGNOSIS — E039 Hypothyroidism, unspecified: Secondary | ICD-10-CM | POA: Diagnosis not present

## 2019-09-08 DIAGNOSIS — E7800 Pure hypercholesterolemia, unspecified: Secondary | ICD-10-CM | POA: Diagnosis not present

## 2019-09-08 NOTE — Telephone Encounter (Signed)
Spoke with the pt  She states that she woke up at 3 am "felt bubbly in my chest" "feels like pleurisy" and coughed up tablespoon full on bright red blood  Since then throughout the day has had some minimal cough with bloody sputum  She states that she is shaky and feels weak today  She denies any SOB or wheezing  No f/c/s  She has had her covid vaccines  Please advise, thanks!  Note that I went over her meds with her and she is not on any anticoagulants

## 2019-09-08 NOTE — Telephone Encounter (Signed)
I do not mind moxifloxacin but there is reports of tendonitis,   I prefere cephalexin 570m three times daily x  5 days if not avelox (moxifloxcin 4047mdaily  X 5 days)   Regarding bronch - [facility]09[facility]19 [facility]     Please schedule the following:  Diagnosis: Hemoptysis Procedure: Video bronchocoopy, flexible bronchoscopy with BAL +/- endobronchial biopsy Envisia Classifer Transbronchial biopsy:NO Anesthesia: Moderate sedation Do you need Fluro? no Priority: 09/09/19 and [facility]/19 [facility] Date: see above Alternate Date: 9/21 - 9/23  Time: any Location: [facility] Does patient have OSA? no DM? no Or Latex allergy? no Medication Restriction: none Anticoagulate/Antiplatelet:stop if taking any Pre-op Labs Ordered: CBC, CMP, PT/INR, PTT - to be done on day of or  < 1 week before scope    Current Outpatient Medications:  .  albuterol (VENTOLIN HFA) 108 (90 Base) MCG/ACT inhaler, Inhale 2 puffs into the lungs every 6 (six) hours as needed for wheezing or shortness of breath. DX; J43.9, Disp: 8 g, Rfl: 2 .  alendronate (FOSAMAX) 70 MG tablet, Take 70 mg by mouth once a week. Take with a full glass of water on an empty stomach., Disp: , Rfl:  .  CALCIUM-PHOSPHORUS PO, Take by mouth daily., Disp: , Rfl:  .  diltiazem (CARDIZEM CD) 240 MG 24 hr capsule, Take 1 capsule (240 mg total) by mouth daily. Please keep upcoming appt in November with Dr. SkMarlou Porchefore anymore refills. Thank you, Disp: 90 capsule, Rfl: 1 .  diltiazem (CARDIZEM) 60 MG tablet, Take 1 tablet (60 mg total) by mouth as needed., Disp: 30 tablet, Rfl: 1 .  hyoscyamine (NULEV) 0.125 MG TBDP disintergrating tablet, Place 0.125 mg under the tongue every 4 (four) hours as needed (GI spasms). , Disp: , Rfl:  .  levothyroxine (SYNTHROID) 175 MCG tablet, Take 175 mcg by mouth daily., Disp: , Rfl:  .  Multiple Vitamin (MULTIVITAMIN) tablet, Take 1 tablet by mouth daily. , Disp: , Rfl:  .   potassium chloride (KLOR-CON) 10 MEQ tablet, TAKE ONE TABLET BY MOUTH DAILY, Disp: 90 tablet, Rfl: 3 .  rosuvastatin (CRESTOR) 20 MG tablet, Take 1 tablet (20 mg total) by mouth daily., Disp: 90 tablet, Rfl: 3 .  Tiotropium Bromide Monohydrate (SPIRIVA RESPIMAT) 1.25 MCG/ACT AERS, Inhale 2 puffs into the lungs daily., Disp: 4 g, Rfl: 0 .  vitamin C (ASCORBIC ACID) 500 MG tablet, Take 500 mg by mouth daily., Disp: , Rfl:    Allergy History:  Allergies  Allergen Reactions  . Demerol [Meperidine]     hallucinations  . Epinephrine     Super hyper  . Gabapentin Diarrhea  . Quinine Derivatives     deaf     Please coordinate Pre-op COVID Testing   Please let Dr RaChase Callernow via reply phone message on Epic  Thank you    SIGNATURE    Dr. MuBrand MalesM.D., F.C.C.P,  Pulmonary and Critical Care Medicine Staff Physician, [facility] - Interstitial Lung Disease  Program  [place] [id number]: [phone] no answer or between  15:00h - 7:00h: call [phone] Telephone: [phone]  6:46 PM 09/08/2019      Allergies  Allergen Reactions  . Demerol [Meperidine]     hallucinations  . Epinephrine     Super hyper  .  Gabapentin Diarrhea  . Quinine Derivatives     deaf

## 2019-09-08 NOTE — Telephone Encounter (Signed)
Called and spoke with pt letting her know the info stated by MR that she needs to have a bronch and she verbalized understanding. Also stated to her that MR wants her to take doxycycline but when stated that to pt, she stated that she cannot take any cycline abx due to other meds she is currently taking.  Pt stated last abx she was prescribed was Moxifloxacin 400mg .  MR, please advise if you are okay with [place] prescribing moxifloxacin 400mg  and if so how frequent and how many tabs.

## 2019-09-08 NOTE — Telephone Encounter (Signed)
  Given recurence of hemoptuysis in face of improving cxr she needs bronchoscopy airway exam  Plan    - I can try to see if we can do it this week at [facility] long because I am on rotation at [place]  - Meanwhile she can try antibiotics - Take doxycycline 100mg  po twice daily x 5 days; take after meals and avoid sunlight in case this is simple bronchitis - if worse go to ER    Allergies  Allergen Reactions  . Demerol [Meperidine]     hallucinations  . Epinephrine     Super hyper  . Gabapentin Diarrhea  . Quinine Derivatives     deaf     IMPRESSION: 1. Further improvement in previously described post infectious/inflammatory interstitial thickening, consistent with scarring. 2. New right-sided pulmonary nodules x2, 3 maximally 4 mm. Non-contrast chest CT can be considered in 12 months, given risk factors for primary bronchogenic carcinoma. This recommendation follows the consensus statement: Guidelines for Management of Incidental Pulmonary Nodules Detected on CT Images: From the Fleischner Society 2017; Radiology 2017; 284:228-243. 3. Aortic atherosclerosis (ICD10-I70.0), coronary artery atherosclerosis and emphysema (ICD10-J43.9). 4. Incompletely imaged upper pole left renal lesion, decreased in size and present back to 2017, presumably a complex cyst.   Electronically Signed   By: Abigail Miyamoto M.D.   On: 08/09/2019 20:13

## 2019-09-09 ENCOUNTER — Encounter: Payer: Self-pay | Admitting: Adult Health

## 2019-09-09 ENCOUNTER — Telehealth: Payer: Self-pay | Admitting: Adult Health

## 2019-09-09 ENCOUNTER — Ambulatory Visit (INDEPENDENT_AMBULATORY_CARE_PROVIDER_SITE_OTHER): Payer: Medicare Other | Admitting: Adult Health

## 2019-09-09 ENCOUNTER — Other Ambulatory Visit: Payer: Self-pay

## 2019-09-09 ENCOUNTER — Ambulatory Visit (INDEPENDENT_AMBULATORY_CARE_PROVIDER_SITE_OTHER): Payer: Medicare Other

## 2019-09-09 DIAGNOSIS — R042 Hemoptysis: Secondary | ICD-10-CM | POA: Diagnosis not present

## 2019-09-09 LAB — D-DIMER, QUANTITATIVE: D-Dimer, Quant: 0.43 mcg/mL FEU (ref <0.50)

## 2019-09-09 MED ORDER — CEPHALEXIN 500 MG PO CAPS: 500.0000 mg | ORAL_CAPSULE | Freq: Three times a day (TID) | ORAL | 0 refills | Status: DC

## 2019-09-09 NOTE — Telephone Encounter (Signed)
If it cannot be done 09/10/19 tomorrow at [facility]  - I can do on those 2 dates n September

## 2019-09-09 NOTE — Telephone Encounter (Signed)
lmtcb for pt.  

## 2019-09-09 NOTE — Progress Notes (Signed)
Virtual Visit via Telephone Note  I connected with Bridget Mcdonald on 09/09/19 at  2:30 PM EDT by telephone and verified that I am speaking with the correct person using two identifiers.  Location: Patient: Home  Provider: Office    I discussed the limitations, risks, security and privacy concerns of performing an evaluation and management service by telephone and the availability of in person appointments. I also discussed with the patient that there may be a patient responsible charge related to this service. The patient expressed understanding and agreed to proceed.   History of Present Illness: 74 year old female active smoker seen for pulmonary consult October 2020 for hemoptysis Retired Arts development officer, from [place].  Today's televisit is for an acute work in for hemoptysis.  Patient has had several episodes of hemoptysis over the last year.  This initially started about 1 year ago.  She was seen for pulmonary consult in October 2020.  CT chest showed groundglass opacities in the middle right upper lobe.  And multiple solid and groundglass pulmonary nodules scattered.  She had autoimmune testing that was essentially unrevealing. Follow-up CT chest December 2020 showed near complete resolution of right upper lobe groundglass opacity.  And decreased size of bilateral pulmonary nodules. Recent CT chest August 09, 2019 showed further improvement in the infectious postinflammatory interstitial thickening.  And some new right-sided pulmonary nodules maximum 4 mm. Patient complains that approximately 1 to 2 days ago she started coughing up some blood again. She has some increased cough and congestion.  Has increased fatigue.  Has some increased chest tightness.  Patient was recommended to begin Keflex 500 mg 3 times daily for 5 days.  And to be set up for a bronchoscopy.  Patient says she continues to have some increased congestion has not picked up her antibiotics.  We went over  recommendations for her bronchoscopy with patient education given.  Patient says unfortunately she has no transportation as she has no one to take her or pick her up from the procedure.  Given patient's recurrent hemoptysis increased shortness of breath and inability for procedure.  I recommend patient seek emergency room care as inability to evaluate properly via televisit.  Patient declines says she has not going to the emergency room. Patient does say that she will come in and get labs and a chest x-ray.  And wants [place] to try to find her medical transportation for procedure.    Observations/Objective: CT chest 07/2019 -Further improvement in previously described post infectious/inflammatory interstitial thickening, consistent with scarring. 2. New right-sided pulmonary nodules x2, 3 maximally 4 mm. Non-contrast chest CT can be considered in 12 months, given risk factors for primary bronchogenic carcinoma.   Assessment and Plan: Recurrent hemoptysis and active smoker.  Questionable etiology.  Unable to adequately assess patient via televisit.  Have recommended emergency room/urgent care evaluation patient declines.  Patient says she will come to the office for chest x-ray and labs if she can get here before our office closes. Per Dr. Chase Caller with request for bronchoscopy.  Lab work will be done including CBC c-Met and a PT/INR. We will contact community resources see if medical transportation is an option for this patient.  Plan  Patient Instructions  Take antibiotics as directed Come to the office for a chest x-ray and labs We will call you with information regarding bronchoscopy Follow-up in the office with Dr. Chase Caller in 1 week and as needed Please contact office for sooner follow up if symptoms do not improve or  worsen or seek emergency care       Follow Up Instructions: Follow up with Dr. Chase Caller in 1 week and.prn  Please contact office for sooner follow up if symptoms do not  improve or worsen or seek emergency care     I discussed the assessment and treatment plan with the patient. The patient was provided an opportunity to ask questions and all were answered. The patient agreed with the plan and demonstrated an understanding of the instructions.   The patient was advised to call back or seek an in-person evaluation if the symptoms worsen or if the condition fails to improve as anticipated.  I provided 40 minutes of non-face-to-face time during this encounter.   Rexene Edison, NP

## 2019-09-09 NOTE — Telephone Encounter (Signed)
Called and spoke with pt letting her know that MR preferred to send Rx for cephalexin to pharmacy than moxifloxacin and pt verbalized understanding. Rx has been sent to pharmacy.  Stated to pt that we will work on getting bronchoscopy scheduled and pt had questions and concerns about the bronch. Visit has been scheduled for pt with TP today 8/26 at 2:30 to see if her questions could be answered prior to having the bronchoscopy scheduled. Pt needed the visit to be a televisit as she has transportation issues. Nothing further needed.

## 2019-09-09 NOTE — Telephone Encounter (Signed)
  °  Regarding bronch -  MR  can do 09/09/19 or Friday 09/10/19 at [facility] - see below Patient does not have transportation, can this be set up with community services     Please schedule the following:  Diagnosis: Hemoptysis Procedure: Video bronchocoopy, flexible bronchoscopy with BAL +/- endobronchial biopsy Envisia Classifer Transbronchial biopsy:NO Anesthesia: Moderate sedation Do you need Fluro? no Priority: 09/09/19 and 09/10/19 - at [facility] Date: see above Alternate Date: 9/21 - 9/23  Time: any Location: [facility] Does patient have OSA? no DM? no Or Latex allergy? no Medication Restriction: none Anticoagulate/Antiplatelet:stop if taking any Pre-op Labs Ordered: CBC, CMP, PT/INR, PTT - to be done on day of or  < 1 week before scope

## 2019-09-09 NOTE — Patient Instructions (Addendum)
Take antibiotics as directed Come to the office for a chest x-ray and labs We will call you with information regarding bronchoscopy Follow-up in the office with Dr. Chase Caller in 1 week and as needed Please contact office for sooner follow up if symptoms do not improve or worsen or seek emergency care

## 2019-09-10 ENCOUNTER — Telehealth: Payer: Self-pay | Admitting: Adult Health

## 2019-09-10 LAB — CBC
HCT: 42.1 % (ref 36.0–46.0)
Hemoglobin: 14.5 g/dL (ref 12.0–15.0)
MCHC: 34.4 g/dL (ref 30.0–36.0)
MCV: 96.5 fl (ref 78.0–100.0)
Platelets: 277 10*3/uL (ref 150.0–400.0)
RBC: 4.36 Mil/uL (ref 3.87–5.11)
RDW: 13 % (ref 11.5–15.5)
WBC: 8.8 10*3/uL (ref 4.0–10.5)

## 2019-09-10 LAB — PROTIME-INR
INR: 1 ratio (ref 0.8–1.0)
Prothrombin Time: 11.3 s (ref 9.6–13.1)

## 2019-09-10 LAB — APTT: aPTT: 26.7 s (ref 23.4–32.7)

## 2019-09-10 LAB — COMPREHENSIVE METABOLIC PANEL
ALT: 15 U/L (ref 0–35)
AST: 17 U/L (ref 0–37)
Albumin: 4.2 g/dL (ref 3.5–5.2)
Alkaline Phosphatase: 71 U/L (ref 39–117)
BUN: 11 mg/dL (ref 6–23)
CO2: 29 mEq/L (ref 19–32)
Calcium: 9.1 mg/dL (ref 8.4–10.5)
Chloride: 103 mEq/L (ref 96–112)
Creatinine, Ser: 0.86 mg/dL (ref 0.40–1.20)
GFR: 64.42 mL/min (ref >60.00)
Glucose, Bld: 107 mg/dL — ABNORMAL HIGH (ref 70–99)
Potassium: 3.5 mEq/L (ref 3.5–5.1)
Sodium: 140 mEq/L (ref 135–145)
Total Bilirubin: 0.4 mg/dL (ref 0.2–1.2)
Total Protein: 7 g/dL (ref 6.0–8.3)

## 2019-09-10 NOTE — Telephone Encounter (Signed)
atc pt, no answer.  Wcb.

## 2019-09-10 NOTE — Progress Notes (Signed)
Called and left a VM for patient letting her know that her D-dimer was negative and to call if she has any questions.  Nothing further needed.

## 2019-09-10 NOTE — Telephone Encounter (Signed)
ATC patient LMTCB to let them know their Bronch is scheduled for 10/07/2019 with Dr. Chase Caller at [facility].  Patient was instructed to arrive at [facility] hospital at [place]. They were instructed to bring someone with them as they will not be able to drive home from procedure. Patient instructed not to have anything to eat or drink after midnight.  Patient's covid screening is scheduled at [facility] location for 10/04/19 at 9:15 .    Routing to Dr. Chase Caller as Juluis Rainier

## 2019-09-10 NOTE — Telephone Encounter (Signed)
Can you page him or message him he wanted a Bronch on her and not sure what he wants to do if he cant do it, he sent several messages about completing the bronch .

## 2019-09-10 NOTE — Telephone Encounter (Signed)
Tammy, Dr Chase Caller does not have any availability on his schedule to see this pt in 1 wk. Please advise if okay to see APP. Thanks!

## 2019-09-10 NOTE — Telephone Encounter (Signed)
Please call and check on patient to make sure she is doing better today. Please also determine if there is transportation as patient as of yesterday did not have any when she get as may be since it set up for 1 month out she will be able to set up transportation.

## 2019-09-10 NOTE — Telephone Encounter (Signed)
The bronch for this patient has already been scheduled as of this morning please see message from yesterday with information. Patient has not been given the information as of yet I have left 2 messages.

## 2019-09-10 NOTE — Telephone Encounter (Signed)
Patient now scheduled for bronc 10/07/19   Patient can seen an app once in between. In itnerim, if hemoptysis worse - go to ER  Cc TP and Triage

## 2019-09-13 NOTE — Telephone Encounter (Signed)
Dr. Chase Caller please advise, with anesthesia they usually do not let people drive home on transportation.   Please advise if patient needs family to transport or can go by transportation

## 2019-09-13 NOTE — Telephone Encounter (Signed)
Spoke with the patient about procedure. She states that her family lives in [place] and they have kids that just went back to school and it would be to much for them to come, her neighbors that have helped her in the past are no longer where she lives, she typically drives but can't for the procedure.   Lauren do you know of any transportation service that would be able to assist this patient the day of her bronch?

## 2019-09-14 ENCOUNTER — Telehealth: Payer: Self-pay | Admitting: Adult Health

## 2019-09-14 DIAGNOSIS — E0590 Thyrotoxicosis, unspecified without thyrotoxic crisis or storm: Secondary | ICD-10-CM | POA: Diagnosis not present

## 2019-09-14 NOTE — Telephone Encounter (Signed)
Already spoke with nurse, patient has appointment on 09/16/19  Nothing further needed

## 2019-09-14 NOTE — Telephone Encounter (Signed)
Seeing her 09/16/19 and will discuss with her transport issues etc., Will close message

## 2019-09-14 NOTE — Progress Notes (Signed)
Patient contacted with results of recent chest x ray. Bridget Mcdonald verbalized understanding of results.

## 2019-09-14 NOTE — Telephone Encounter (Signed)
Will close message. Seeing her 09/16/19 and will discuss

## 2019-09-16 ENCOUNTER — Telehealth: Payer: Self-pay | Admitting: Internal Medicine

## 2019-09-16 ENCOUNTER — Ambulatory Visit (INDEPENDENT_AMBULATORY_CARE_PROVIDER_SITE_OTHER): Payer: Medicare Other | Admitting: Internal Medicine

## 2019-09-16 ENCOUNTER — Encounter: Payer: Self-pay | Admitting: Internal Medicine

## 2019-09-16 ENCOUNTER — Other Ambulatory Visit: Payer: Self-pay

## 2019-09-16 VITALS — BP 122/62 | HR 74 | Temp 98.2°F | Ht 67.0 in | Wt 154.4 lb

## 2019-09-16 DIAGNOSIS — Z87891 Personal history of nicotine dependence: Secondary | ICD-10-CM | POA: Diagnosis not present

## 2019-09-16 DIAGNOSIS — R042 Hemoptysis: Secondary | ICD-10-CM | POA: Diagnosis not present

## 2019-09-16 NOTE — Progress Notes (Signed)
HPI  IOV 11/03/2018  Chief Complaint  Patient presents with  . Consult     Bridget Mcdonald is 74 y.o. 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  She is a heavy smoker.  She has multiple complaints with the main reason for referral and the main complaint currently is 1 of hemoptysis ongoing for couple of months.  It is new in onset.  She believes she might have something autoimmune going on.  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  In addition for the last 2 or 3 years she has had dry skin in her lower extremities and the skin is flaky.  She states that the etiologic diagnosis for both these conditions is idiopathic.  Then approximately 2 at the end of August she started developing hemoptysis.  Since then it has persisted although the active frank blood is no longer present since October 04, 2018 or so.  She states the first episode of hemoptysis started August 27, 2018 and went through the whole weekend.  Then around September 06, 2018 she was in bed with symptoms ranging from coughing of blood clots fatigue, freezing cold, nausea and uncontrollable diarrhea and a sense that her atrial fibrillation was not under control.  The following week she did have right neck and ear pain with pressure pain in the left lower lateral lung in the lower part.  Fatigue also started.  She had a CT scan of the chest on September 9 that shows right upper lobe posterior segment groundglass opacities consistent with local alveolar hemorrhage in my personal visualization and opinion.  However she points to the left lower lobe infrascapular area as a point of discomfort.  In this area there is a small subpleural nodularity.  Then from mid September through October 10, 2018 she took Avelox she continued to have fatigue and  anxiety.  Around this timeframe hemoptysis resolved.  She says this is mainly because she is controlling her cough and clearing her throat.  But the chest pressure has persisted.  She feels the neuropathy has worsened.  She feels the same amount of edema as well.  She is also feels she is constantly hungry and rapidly gaining weight  She continues to smoke heavily.  Review of labs show nonbacterial microscopic hematuria in 2017.  She does not recollect taking antibiotics for this.  She had an echocardiogram in 2017 that was normal but she says she has a history of mitral valve prolapse.  She also has shortness of breath with exertion relieved by rest although walking desaturation test showed she did not desaturate.  She walked 185 feet x 3 laps in office with a resting pulse ox of 99% and the resting heart rate of 96/min.  When she finished she was pulse ox 100% and a heart rate of 117/min.  IMPRESSION: CT chest 1. Interlobular septal line thickening and ground-glass opacities in the medial right upper lobe. This is nonspecific, but can see be seen in pulmonary hemorrhage syndromes, pneumonia, and pulmonary alveolar proteinosis. 2. Multiple bilateral solid and ground-glass pulmonary nodules. Non-contrast chest CT at 3-6 months is recommended. If the nodules are stable at time of repeat CT, then future CT at 18-24 months (from today's scan) is considered optional for low-risk patients, but  is recommended for high-risk patients. This recommendation follows the consensus statement: Guidelines for Management of Incidental Pulmonary Nodules Detected on CT Images: From the Fleischner Society 2017; Radiology 2017; 284:228-243.  Aortic Atherosclerosis (ICD10-I70.0).   Electronically Signed   By: Zerita Boers M.D.   On: 09/24/2018 09:40    OV 01/11/2019  Subjective:  Patient ID: Bridget Mcdonald, female , DOB: 1945-07-11 , age 74 y.o. , MRN: [id number] , ADDRESS: [place] [address]   01/11/2019 -   Chief Complaint  Patient presents with  . Follow-up    Pt states she has been doing okay since last visit. Pt said she has been coughing up green-yellow phlegm which has been going on x4 days.     HPI [name] 74 y.o. -presents for follow-up of review of her results.  Since her last visit in October she has not had any more hemoptysis.  She had a CT scan of the chest that I personally visualized.  The pulmonary infiltrate is resolved.  The nodules have improved.  She tells me that her smoking has relapsed.  There is a history of microscopic hematuria but her repeat UA shows only 3-6 RBCs per high-power field.  At this point in time she says that she has been isolating pretty well.  There is no clusters for COVID-19.  However for the last day or so she has had cough increase with yellow-green phlegm.  She thinks is because her smoking has relapsed.  She thinks if she quit smoking this will improve.  She is not keen on antibiotics of prednisone because of multiple different allergies.  We reviewed her pulmonary function test from 2014 and this shows isolated reduction diffusion capacity.  Her current CT chest in my personal visualization shows some emphysema although this is not reported in the official report.  She admits to anxiety particularly when she quit smoking.   IMPRESSION:  Lungs/Pleura: Resolution of the region of ground-glass opacity and pleural nodularity in the RIGHT upper lobe. There is minimal residual pleuroparenchymal nodularity measuring 16 mm x 9 mm decreased from 22 mm x 12 mm on prior (image 24/8).  Smaller scattered pulmonary nodules also are improved.  For example RIGHT lower lobe nodule measuring 5 mm (image 90/8) compares to 7 mm.  Several adjacent LEFT lower lobe nodules are also decreased. For example 2 mm nodule (image 80/8) decreased from 7 mm.  LEFT upper lobe nodule previously on image 36/3 has  resolved completely.  1. Near complete resolution of RIGHT upper lobe ground-glass opacity and pleural nodularity. Interval decrease in size of bilateral pulmonary nodules. Findings consistent with resolving infectious or inflammatory process. 2. Aortic Atherosclerosis (ICD10-I70.0).   Electronically Signed   By: Suzy Bouchard M.D.   On: 01/04/2019 08:13   Results for JAYLNN, ULLERY (MRN [id number]) as of 01/11/2019 09:23  Ref. Range 11/03/2018 12:24  ANA Titer 1 Unknown Negative  ANCA Proteinase 3 Latest Ref Range: 0.0 - 3.5 U/mL <3.5  Angiotensin-Converting Enzyme Latest Ref Range: 9 - 67 U/L 20  Cyclic Citrullin Peptide Ab Latest Units: UNITS <16  dsDNA Ab Latest Ref Range: 0 - 9 IU/mL <1  ENA RNP Ab Latest Ref Range: 0.0 - 0.9 AI <0.2  ENA SSA (RO) Ab Latest Ref Range: 0.0 - 0.9 AI <0.2  ENA SSB (LA) Ab Latest Ref Range: 0.0 - 0.9 AI <0.2  Myeloperoxidase Abs Latest Units: AI <1.0  Serine Protease 3 Latest Units: AI <1.0  RA Latex  Turbid. Latest Ref Range: <14 IU/mL 24 (H)  ENA SM Ab Ser-aCnc Latest Ref Range: 0.0 - 0.9 AI <0.2  Scleroderma (Scl-70) (ENA) Antibody, IgG Latest Ref Range: 0.0 - 0.9 AI <0.2    Results for LINNEA, TODISCO (MRN [id number]) as of 01/11/2019 09:23  Ref. Range 11/03/2018 12:24  RBC / HPF Latest Ref Range: 0-2/hpf  3-6/hpf (A)  Results for LORECE, KEACH (MRN [id number]) as of 01/11/2019 09:23  Ref. Range 04/25/2015 15:49  RBC / HPF Latest Ref Range: 0 - 5 RBC/hpf TOO NUMEROUS TO COUNT   Sep 09, 2019 tele visit  25-year-old female active smoker seen for pulmonary consult October 2020 for hemoptysis Retired Arts development officer, from [place].  Today's televisit is for an acute work in for hemoptysis.  Patient has had several episodes of hemoptysis over the last year.  This initially started about 1 year ago.  She was seen for pulmonary consult in October 2020.  CT chest showed groundglass opacities in the middle right upper lobe.   And multiple solid and groundglass pulmonary nodules scattered.  She had autoimmune testing that was essentially unrevealing. Follow-up CT chest December 2020 showed near complete resolution of right upper lobe groundglass opacity.  And decreased size of bilateral pulmonary nodules. Recent CT chest August 09, 2019 showed further improvement in the infectious postinflammatory interstitial thickening.  And some new right-sided pulmonary nodules maximum 4 mm. Patient complains that approximately 1 to 2 days ago she started coughing up some blood again. She has some increased cough and congestion.  Has increased fatigue.  Has some increased chest tightness.  Patient was recommended to begin Keflex 500 mg 3 times daily for 5 days.  And to be set up for a bronchoscopy.  Patient says she continues to have some increased congestion has not picked up her antibiotics.  We went over recommendations for her bronchoscopy with patient education given.  Patient says unfortunately she has no transportation as she has no one to take her or pick her up from the procedure.  Given patient's recurrent hemoptysis increased shortness of breath and inability for procedure.  I recommend patient seek emergency room care as inability to evaluate properly via televisit.  Patient declines says she has not going to the emergency room. Patient does say that she will come in and get labs and a chest x-ray.  And wants [place] to try to find her medical transportation for procedure.    Observations/Objective: CT chest 07/2019 -Further improvement in previously described post infectious/inflammatory interstitial thickening, consistent with scarring. 2. New right-sided pulmonary nodules x2, 3 maximally 4 mm. Non-contrast chest CT can be considered in 12 months, given risk factors for primary bronchogenic carcinoma.   Assessment and Plan: Recurrent hemoptysis and active smoker.  Questionable etiology.  Unable to adequately assess patient via  televisit.  Have recommended emergency room/urgent care evaluation patient declines.  Patient says she will come to the office for chest x-ray and labs if she can get here before our office closes. Per Dr. Chase Caller with request for bronchoscopy.  Lab work will be done including CBC c-Met and a PT/INR. We will contact community resources see if medical transportation is an option for this patient.  Plan  Patient Instructions  Take antibiotics as directed Come to the office for a chest x-ray and labs We will call you with information regarding bronchoscopy Follow-up in the office with Dr. Chase Caller in 1 week and as needed Please contact office for sooner follow up if symptoms  do not improve or worsen or seek emergency care          xxxxxxxxxxxxxxxxxxxxxxx OV 09/16/2019  Subjective:  Patient ID: Bridget Mcdonald, female , DOB: May 04, 1945 , age 52 y.o. , MRN: [id number] , ADDRESS: [address] [place] [address]   09/16/2019 -   Chief Complaint  Patient presents with  . Follow-up    Hemoptysis     HPI Zonie Crutcher 74 y.o. -patient follows up from her recent hemoptysis.  She tells me she had abrupt hemoptysis it was small in amount.  After we give antibiotics and she is a Designer, jewellery it is resolved.  I recommended a bronchoscopy but she tells me that there is significant schedule issues.  She does not have transport.  I want to do the bronchoscopy last week.  She has no neighbors or friends who could bring her.  She is not allowed to take a ride back on her own because of the sedation issues.  So she is here to discuss her options.  We discussed the possibility about doing a bronchoscopy in the afternoon and putting her overnight as observation given high risk and social issues.  She is willing for that.  At this point in time no hemoptysis and she feels back to baseline.  She question me about the recent chest x-ray changes and we reviewed that.     ROS - per  HPI     has a past medical history of Acid reflux, Allergy history unknown, Asthma, mild intermittent, Barrett's esophagus, Cancer (HCC), Chronic headaches, Cyst of left kidney, Hematuria, High cholesterol, History of cardiac arrhythmia, History of colon polyps, Hyperlipidemia, Hypertension, Hypothyroidism, Hypotonia, IBS (irritable bowel syndrome), Incontinence, feces, Migraine headache, Osteopenia, Palpitations (12/01/2012), PVC's (premature ventricular contractions), Vertigo, Vestibular neuronitis, and Vocal cord polyps.   reports that she has been smoking cigarettes. She has a 25.00 pack-year smoking history. She has never used smokeless tobacco.  Past Surgical History:  Procedure Laterality Date  . APPENDECTOMY  1964  . BREAST LUMPECTOMY Left 2005  . COLONOSCOPY  04/2009  . OOPHORECTOMY  1968?  Marland Kitchen SKIN SURGERY  08/2012  . TONSILECTOMY, ADENOIDECTOMY, BILATERAL MYRINGOTOMY AND TUBES  1961  . TUBAL LIGATION      Allergies  Allergen Reactions  . Demerol [Meperidine]     hallucinations  . Epinephrine     Super hyper  . Gabapentin Diarrhea  . Quinine Derivatives     deaf    Immunization History  Administered Date(s) Administered  . Fluad Quad(high Dose 65+) 10/15/2018  . Influenza Split 10/15/2010  . PFIZER SARS-COV-2 Vaccination 04/08/2019, 05/04/2019  . Pneumococcal Conjugate-13 08/14/2017  . Pneumococcal Polysaccharide-23 01/14/2009    Family History  Problem Relation Age of Onset  . Emphysema Mother   . Cancer Father        prostate  . Heart disease Paternal Grandfather   . Cancer Brother        prostate  . Bladder Cancer Other      Current Outpatient Medications:  .  albuterol (VENTOLIN HFA) 108 (90 Base) MCG/ACT inhaler, Inhale 2 puffs into the lungs every 6 (six) hours as needed for wheezing or shortness of breath. DX; J43.9, Disp: 8 g, Rfl: 2 .  alendronate (FOSAMAX) 70 MG tablet, Take 70 mg by mouth once a week. Take with a full glass of water on an empty  stomach., Disp: , Rfl:  .  CALCIUM-PHOSPHORUS PO, Take by mouth daily., Disp: , Rfl:  .  diltiazem (  CARDIZEM CD) 240 MG 24 hr capsule, Take 1 capsule (240 mg total) by mouth daily. Please keep upcoming appt in November with Dr. Marlou Porch before anymore refills. Thank you, Disp: 90 capsule, Rfl: 1 .  diltiazem (CARDIZEM) 60 MG tablet, Take 1 tablet (60 mg total) by mouth as needed., Disp: 30 tablet, Rfl: 1 .  hyoscyamine (NULEV) 0.125 MG TBDP disintergrating tablet, Place 0.125 mg under the tongue every 4 (four) hours as needed (GI spasms). , Disp: , Rfl:  .  meclizine (ANTIVERT) 12.5 MG tablet, Take 12.5 mg by mouth 3 (three) times daily as needed for dizziness., Disp: , Rfl:  .  Multiple Vitamin (MULTIVITAMIN) tablet, Take 1 tablet by mouth daily. , Disp: , Rfl:  .  potassium chloride (KLOR-CON) 10 MEQ tablet, TAKE ONE TABLET BY MOUTH DAILY, Disp: 90 tablet, Rfl: 3 .  rosuvastatin (CRESTOR) 20 MG tablet, Take 1 tablet (20 mg total) by mouth daily., Disp: 90 tablet, Rfl: 3 .  vitamin C (ASCORBIC ACID) 500 MG tablet, Take 500 mg by mouth daily., Disp: , Rfl:       Objective:   Vitals:   09/16/19 1342  BP: 122/62  Pulse: 74  Temp: 98.2 F (36.8 C)  TempSrc: Oral  SpO2: 96%  Weight: 154 lb 6.4 oz (70 kg)  Height: $Remove'5\' 7"'LmLkyrG$  (1.702 m)    Estimated body mass index is 24.18 kg/m as calculated from the following:   Height as of this encounter: $RemoveBeforeD'5\' 7"'TjLmwuovduJDBv$  (1.702 m).   Weight as of this encounter: 154 lb 6.4 oz (70 kg).  $Rem'@WEIGHTCHANGE'QdYe$ @  [employer]   09/16/19 1342  Weight: 154 lb 6.4 oz (70 kg)     Physical Exam Grossly nonfocal exam.  She has dry flaky skin clear to auscultation normal heart sounds alert and oriented x3.  Speech normal.  No neck nodes no elevated JVP no stigmata of connective tissue disease.        Assessment:       ICD-10-CM   1. Hemoptysis  R04.2   2. History of smoking  Z87.891        Plan:     Patient Instructions     ICD-10-CM   1. Hemoptysis  R04.2   2.  History of smoking  Z87.891      plsn for bronchoscopy with airway exam with or without endobronchial biopsy 9/21, 9/22 or 9/23   - plan for later in afternoon  - will have you observation overnight due to risk and lack of transport     Risks of pneumothorax, hemothorax, sedation/anesthesia complications such as cardiac or respiratory arrest or hypotension, stroke and bleeding all explained. Benefits of diagnosis but limitations of non-diagnosis also explained. Patient verbalized understanding and wished to proceed.     SIGNATURE    Dr. Brand Males, M.D., F.C.C.P,  Pulmonary and Critical Care Medicine Staff Physician, [place] Director - Interstitial Lung Disease  Program  Pulmonary [place] at [place], [place], [address]  Pager: [phone], If no answer or between  15:00h - 7:00h: call [phone] Telephone: [phone]  2:12 PM 09/16/2019

## 2019-09-16 NOTE — H&P (View-Only) (Signed)
Bridget  IOV 11/03/2018  Chief Complaint  Patient presents with  . Consult     Bridget Mcdonald is 74 y.o. 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  She is a heavy smoker.  She has multiple complaints with the main reason for referral and the main complaint currently is 1 of hemoptysis ongoing for couple of months.  It is new in onset.  She believes she might have something autoimmune going on.  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  In addition for the last 2 or 3 years she has had dry skin in her lower extremities and the skin is flaky.  She states that the etiologic diagnosis for both these conditions is idiopathic.  Then approximately 2 at the end of August she started developing hemoptysis.  Since then it has persisted although the active frank blood is no longer present since October 04, 2018 or so.  She states the first episode of hemoptysis started August 27, 2018 and went through the whole weekend.  Then around September 06, 2018 she was in bed with symptoms ranging from coughing of blood clots fatigue, freezing cold, nausea and uncontrollable diarrhea and a sense that her atrial fibrillation was not under control.  The following week she did have right neck and ear pain with pressure pain in the left lower lateral lung in the lower part.  Fatigue also started.  She had a CT scan of the chest on September 9 that shows right upper lobe posterior segment groundglass opacities consistent with local alveolar hemorrhage in my personal visualization and opinion.  However she points to the left lower lobe infrascapular area as a point of discomfort.  In this area there is a small subpleural nodularity.  Then from mid September through October 10, 2018 she took Avelox she continued to have fatigue and  anxiety.  Around this timeframe hemoptysis resolved.  She says this is mainly because she is controlling her cough and clearing her throat.  But the chest pressure has persisted.  She feels the neuropathy has worsened.  She feels the same amount of edema as well.  She is also feels she is constantly hungry and rapidly gaining weight  She continues to smoke heavily.  Review of labs show nonbacterial microscopic hematuria in 2017.  She does not recollect taking antibiotics for this.  She had an echocardiogram in 2017 that was normal but she says she has a history of mitral valve prolapse.  She also has shortness of breath with exertion relieved by rest although walking desaturation test showed she did not desaturate.  She walked 185 feet x 3 laps in office with a resting pulse ox of 99% and the resting heart rate of 96/min.  When she finished she was pulse ox 100% and a heart rate of 117/min.  IMPRESSION: CT chest 1. Interlobular septal line thickening and ground-glass opacities in the medial right upper lobe. This is nonspecific, but can see be seen in pulmonary hemorrhage syndromes, pneumonia, and pulmonary alveolar proteinosis. 2. Multiple bilateral solid and ground-glass pulmonary nodules. Non-contrast chest CT at 3-6 months is recommended. If the nodules are stable at time of repeat CT, then future CT at 18-24 months (from today's scan) is considered optional for low-risk patients, but  is recommended for high-risk patients. This recommendation follows the consensus statement: Guidelines for Management of Incidental Pulmonary Nodules Detected on CT Images: From the Fleischner Society 2017; Radiology 2017; 284:228-243.  Aortic Atherosclerosis (ICD10-I70.0).   Electronically Signed   By: Zerita Boers M.D.   On: 09/24/2018 09:40    OV 01/11/2019  Subjective:  Patient ID: Bridget Mcdonald, Bridget Mcdonald , DOB: 1945-07-11 , age 74 y.o. , MRN: [id number] , ADDRESS: [place] [address]   01/11/2019 -   Chief Complaint  Patient presents with  . Follow-up    Pt states she has been doing okay since last visit. Pt said she has been coughing up green-yellow phlegm which has been going on x4 days.     Bridget Bridget Mcdonald 74 y.o. -presents for follow-up of review of her results.  Since her last visit in October she has not had any more hemoptysis.  She had a CT scan of the chest that I personally visualized.  The pulmonary infiltrate is resolved.  The nodules have improved.  She tells me that her smoking has relapsed.  There is a history of microscopic hematuria but her repeat UA shows only 3-6 RBCs per high-power field.  At this point in time she says that she has been isolating pretty well.  There is no clusters for COVID-19.  However for the last day or so she has had cough increase with yellow-green phlegm.  She thinks is because her smoking has relapsed.  She thinks if she quit smoking this will improve.  She is not keen on antibiotics of prednisone because of multiple different allergies.  We reviewed her pulmonary function test from 2014 and this shows isolated reduction diffusion capacity.  Her current CT chest in my personal visualization shows some emphysema although this is not reported in the official report.  She admits to anxiety particularly when she quit smoking.   IMPRESSION:  Lungs/Pleura: Resolution of the region of ground-glass opacity and pleural nodularity in the RIGHT upper lobe. There is minimal residual pleuroparenchymal nodularity measuring 16 mm x 9 mm decreased from 22 mm x 12 mm on prior (image 24/8).  Smaller scattered pulmonary nodules also are improved.  For example RIGHT lower lobe nodule measuring 5 mm (image 90/8) compares to 7 mm.  Several adjacent LEFT lower lobe nodules are also decreased. For example 2 mm nodule (image 80/8) decreased from 7 mm.  LEFT upper lobe nodule previously on image 36/3 has  resolved completely.  1. Near complete resolution of RIGHT upper lobe ground-glass opacity and pleural nodularity. Interval decrease in size of bilateral pulmonary nodules. Findings consistent with resolving infectious or inflammatory process. 2. Aortic Atherosclerosis (ICD10-I70.0).   Electronically Signed   By: Suzy Bouchard M.D.   On: 01/04/2019 08:13   Results for JAYLNN, ULLERY (MRN [id number]) as of 01/11/2019 09:23  Ref. Range 11/03/2018 12:24  ANA Titer 1 Unknown Negative  ANCA Proteinase 3 Latest Ref Range: 0.0 - 3.5 U/mL <3.5  Angiotensin-Converting Enzyme Latest Ref Range: 9 - 67 U/L 20  Cyclic Citrullin Peptide Ab Latest Units: UNITS <16  dsDNA Ab Latest Ref Range: 0 - 9 IU/mL <1  ENA RNP Ab Latest Ref Range: 0.0 - 0.9 AI <0.2  ENA SSA (RO) Ab Latest Ref Range: 0.0 - 0.9 AI <0.2  ENA SSB (LA) Ab Latest Ref Range: 0.0 - 0.9 AI <0.2  Myeloperoxidase Abs Latest Units: AI <1.0  Serine Protease 3 Latest Units: AI <1.0  RA Latex  Turbid. Latest Ref Range: <14 IU/mL 24 (H)  ENA SM Ab Ser-aCnc Latest Ref Range: 0.0 - 0.9 AI <0.2  Scleroderma (Scl-70) (ENA) Antibody, IgG Latest Ref Range: 0.0 - 0.9 AI <0.2    Results for ISIS, COSTANZA (MRN [id number]) as of 01/11/2019 09:23  Ref. Range 11/03/2018 12:24  RBC / HPF Latest Ref Range: 0-2/hpf  3-6/hpf (A)  Results for RAYSHAWN, VISCONTI (MRN [id number]) as of 01/11/2019 09:23  Ref. Range 04/25/2015 15:49  RBC / HPF Latest Ref Range: 0 - 5 RBC/hpf TOO NUMEROUS TO COUNT   Sep 09, 2019 tele visit  9-year-old Bridget Mcdonald active smoker seen for pulmonary consult October 2020 for hemoptysis Retired Arts development officer, from [place].  Today's televisit is for an acute work in for hemoptysis.  Patient has had several episodes of hemoptysis over the last year.  This initially started about 1 year ago.  She was seen for pulmonary consult in October 2020.  CT chest showed groundglass opacities in the middle right upper lobe.   And multiple solid and groundglass pulmonary nodules scattered.  She had autoimmune testing that was essentially unrevealing. Follow-up CT chest December 2020 showed near complete resolution of right upper lobe groundglass opacity.  And decreased size of bilateral pulmonary nodules. Recent CT chest August 09, 2019 showed further improvement in the infectious postinflammatory interstitial thickening.  And some new right-sided pulmonary nodules maximum 4 mm. Patient complains that approximately 1 to 2 days ago she started coughing up some blood again. She has some increased cough and congestion.  Has increased fatigue.  Has some increased chest tightness.  Patient was recommended to begin Keflex 500 mg 3 times daily for 5 days.  And to be set up for a bronchoscopy.  Patient says she continues to have some increased congestion has not picked up her antibiotics.  We went over recommendations for her bronchoscopy with patient education given.  Patient says unfortunately she has no transportation as she has no one to take her or pick her up from the procedure.  Given patient's recurrent hemoptysis increased shortness of breath and inability for procedure.  I recommend patient seek emergency room care as inability to evaluate properly via televisit.  Patient declines says she has not going to the emergency room. Patient does say that she will come in and get labs and a chest x-ray.  And wants [place] to try to find her medical transportation for procedure.    Observations/Objective: CT chest 07/2019 -Further improvement in previously described post infectious/inflammatory interstitial thickening, consistent with scarring. 2. New right-sided pulmonary nodules x2, 3 maximally 4 mm. Non-contrast chest CT can be considered in 12 months, given risk factors for primary bronchogenic carcinoma.   Assessment and Plan: Recurrent hemoptysis and active smoker.  Questionable etiology.  Unable to adequately assess patient via  televisit.  Have recommended emergency room/urgent care evaluation patient declines.  Patient says she will come to the office for chest x-ray and labs if she can get here before our office closes. Per Dr. Chase Caller with request for bronchoscopy.  Lab work will be done including CBC c-Met and a PT/INR. We will contact community resources see if medical transportation is an option for this patient.  Plan  Patient Instructions  Take antibiotics as directed Come to the office for a chest x-ray and labs We will call you with information regarding bronchoscopy Follow-up in the office with Dr. Chase Caller in 1 week and as needed Please contact office for sooner follow up if symptoms  do not improve or worsen or seek emergency care          xxxxxxxxxxxxxxxxxxxxxxx OV 09/16/2019  Subjective:  Patient ID: Bridget Mcdonald, Bridget Mcdonald , DOB: 15-Oct-1945 , age 1 y.o. , MRN: [id number] , ADDRESS: [address] [place] [address]   09/16/2019 -   Chief Complaint  Patient presents with  . Follow-up    Hemoptysis     Bridget Mcdonald 74 y.o. -patient follows up from her recent hemoptysis.  She tells me she had abrupt hemoptysis it was small in amount.  After we give antibiotics and she is a Designer, jewellery it is resolved.  I recommended a bronchoscopy but she tells me that there is significant schedule issues.  She does not have transport.  I want to do the bronchoscopy last week.  She has no neighbors or friends who could bring her.  She is not allowed to take a ride back on her own because of the sedation issues.  So she is here to discuss her options.  We discussed the possibility about doing a bronchoscopy in the afternoon and putting her overnight as observation given high risk and social issues.  She is willing for that.  At this point in time no hemoptysis and she feels back to baseline.  She question me about the recent chest x-ray changes and we reviewed that.     ROS - per  Bridget     has a past medical history of Acid reflux, Allergy history unknown, Asthma, mild intermittent, Barrett's esophagus, Cancer (HCC), Chronic headaches, Cyst of left kidney, Hematuria, High cholesterol, History of cardiac arrhythmia, History of colon polyps, Hyperlipidemia, Hypertension, Hypothyroidism, Hypotonia, IBS (irritable bowel syndrome), Incontinence, feces, Migraine headache, Osteopenia, Palpitations (12/01/2012), PVC's (premature ventricular contractions), Vertigo, Vestibular neuronitis, and Vocal cord polyps.   reports that she has been smoking cigarettes. She has a 25.00 pack-year smoking history. She has never used smokeless tobacco.  Past Surgical History:  Procedure Laterality Date  . APPENDECTOMY  1964  . BREAST LUMPECTOMY Left 2005  . COLONOSCOPY  04/2009  . OOPHORECTOMY  1968?  Marland Kitchen SKIN SURGERY  08/2012  . TONSILECTOMY, ADENOIDECTOMY, BILATERAL MYRINGOTOMY AND TUBES  1961  . TUBAL LIGATION      Allergies  Allergen Reactions  . Demerol [Meperidine]     hallucinations  . Epinephrine     Super hyper  . Gabapentin Diarrhea  . Quinine Derivatives     deaf    Immunization History  Administered Date(s) Administered  . Fluad Quad(high Dose 65+) 10/15/2018  . Influenza Split 10/15/2010  . PFIZER SARS-COV-2 Vaccination 04/08/2019, 05/04/2019  . Pneumococcal Conjugate-13 08/14/2017  . Pneumococcal Polysaccharide-23 01/14/2009    Family History  Problem Relation Age of Onset  . Emphysema Mother   . Cancer Father        prostate  . Heart disease Paternal Grandfather   . Cancer Brother        prostate  . Bladder Cancer Other      Current Outpatient Medications:  .  albuterol (VENTOLIN HFA) 108 (90 Base) MCG/ACT inhaler, Inhale 2 puffs into the lungs every 6 (six) hours as needed for wheezing or shortness of breath. DX; J43.9, Disp: 8 g, Rfl: 2 .  alendronate (FOSAMAX) 70 MG tablet, Take 70 mg by mouth once a week. Take with a full glass of water on an empty  stomach., Disp: , Rfl:  .  CALCIUM-PHOSPHORUS PO, Take by mouth daily., Disp: , Rfl:  .  diltiazem (  CARDIZEM CD) 240 MG 24 hr capsule, Take 1 capsule (240 mg total) by mouth daily. Please keep upcoming appt in November with Dr. Marlou Porch before anymore refills. Thank you, Disp: 90 capsule, Rfl: 1 .  diltiazem (CARDIZEM) 60 MG tablet, Take 1 tablet (60 mg total) by mouth as needed., Disp: 30 tablet, Rfl: 1 .  hyoscyamine (NULEV) 0.125 MG TBDP disintergrating tablet, Place 0.125 mg under the tongue every 4 (four) hours as needed (GI spasms). , Disp: , Rfl:  .  meclizine (ANTIVERT) 12.5 MG tablet, Take 12.5 mg by mouth 3 (three) times daily as needed for dizziness., Disp: , Rfl:  .  Multiple Vitamin (MULTIVITAMIN) tablet, Take 1 tablet by mouth daily. , Disp: , Rfl:  .  potassium chloride (KLOR-CON) 10 MEQ tablet, TAKE ONE TABLET BY MOUTH DAILY, Disp: 90 tablet, Rfl: 3 .  rosuvastatin (CRESTOR) 20 MG tablet, Take 1 tablet (20 mg total) by mouth daily., Disp: 90 tablet, Rfl: 3 .  vitamin C (ASCORBIC ACID) 500 MG tablet, Take 500 mg by mouth daily., Disp: , Rfl:       Objective:   Vitals:   09/16/19 1342  BP: 122/62  Pulse: 74  Temp: 98.2 F (36.8 C)  TempSrc: Oral  SpO2: 96%  Weight: 154 lb 6.4 oz (70 kg)  Height: $Remove'5\' 7"'WGGBnfn$  (1.702 m)    Estimated body mass index is 24.18 kg/m as calculated from the following:   Height as of this encounter: $RemoveBeforeD'5\' 7"'WnaZQpdQKyjExL$  (1.702 m).   Weight as of this encounter: 154 lb 6.4 oz (70 kg).  $Rem'@WEIGHTCHANGE'MTCt$ @  [employer]   09/16/19 1342  Weight: 154 lb 6.4 oz (70 kg)     Physical Exam Grossly nonfocal exam.  She has dry flaky skin clear to auscultation normal heart sounds alert and oriented x3.  Speech normal.  No neck nodes no elevated JVP no stigmata of connective tissue disease.        Assessment:       ICD-10-CM   1. Hemoptysis  R04.2   2. History of smoking  Z87.891        Plan:     Patient Instructions     ICD-10-CM   1. Hemoptysis  R04.2   2.  History of smoking  Z87.891      plsn for bronchoscopy with airway exam with or without endobronchial biopsy 9/21, 9/22 or 9/23   - plan for later in afternoon  - will have you observation overnight due to risk and lack of transport     Risks of pneumothorax, hemothorax, sedation/anesthesia complications such as cardiac or respiratory arrest or hypotension, stroke and bleeding all explained. Benefits of diagnosis but limitations of non-diagnosis also explained. Patient verbalized understanding and wished to proceed.     SIGNATURE    Dr. Brand Males, M.D., F.C.C.P,  Pulmonary and Critical Care Medicine Staff Physician, [place] Director - Interstitial Lung Disease  Program  Pulmonary [place] at [place], [place], [address]  Pager: [phone], If no answer or between  15:00h - 7:00h: call [phone] Telephone: [phone]  2:12 PM 09/16/2019

## 2019-09-16 NOTE — Telephone Encounter (Signed)
Repeat request of bronchoscopy.  Please locate original request note note.  This would be an airway exam with or without endobronchial lesion.  No fluoroscopy needed.  The dates would be between September 21 and October 07, 2019  Sedation is moderate sedation  Time prefers the afternoon at [facility] long  Patient does not have any transport.  Therefore she will stay overnight observation in the room especially given her medical issues.  Pulmonary service will discharge her the next morning  No fluoroscopy needed No TB risk Stop all anticoagulation before procedure

## 2019-09-16 NOTE — Telephone Encounter (Signed)
Lauren please advise on what is needed to facilitate this.  Thanks!

## 2019-09-16 NOTE — Patient Instructions (Signed)
ICD-10-CM   1. Hemoptysis  R04.2   2. History of smoking  Z87.891      plsn for bronchoscopy with airway exam with or without endobronchial biopsy 9/21, 9/22 or 9/23   - plan for later in afternoon  - will have you observation overnight due to risk and lack of transport

## 2019-09-24 NOTE — Telephone Encounter (Signed)
Spoke with ENDO  & their leadership  said if it's not emergent, it needs to get pushed back to mid-to late october. that would be better for an overnight stay   MR please advise on when to reschedule

## 2019-09-24 NOTE — Telephone Encounter (Deleted)
so leadership said if it's not emergent, it needs to get pushed back to mid-to late october. that would be better for an overnight stay   MR please advise

## 2019-09-24 NOTE — Progress Notes (Signed)
Attempted to obtain medical history via telephone, unable to reach at this time. I left a voicemail to return pre surgical testing department's phone call.  

## 2019-09-28 NOTE — Telephone Encounter (Signed)
Yes please let patient know that if she wants to do it in September and I believe a simple bronchoscopy is indicated because of recurrent hemoptysis then she will need to get transport.  If she cannot get transport then given the high census from COVID-19 I support administrations decision to reassess in mid-October 18-21s first would be the best time frame

## 2019-09-28 NOTE — Telephone Encounter (Signed)
ATC patient but unable to reach left her a message to call us back, regarding procedure on 10/07/19

## 2019-09-29 NOTE — Telephone Encounter (Signed)
Patient is returning phone call. Patient phone number is [phone].

## 2019-09-29 NOTE — Telephone Encounter (Signed)
ATC patient phone rang and rang and rang no option to leave voicemail

## 2019-10-04 ENCOUNTER — Other Ambulatory Visit (HOSPITAL_COMMUNITY)
Admission: RE | Admit: 2019-10-04 | Discharge: 2019-10-04 | Disposition: A | Discharge status: Home / Self Care | Payer: Medicare Other | Source: Ambulatory Visit | Attending: Internal Medicine | Admitting: Internal Medicine

## 2019-10-04 DIAGNOSIS — Z20822 Contact with and (suspected) exposure to covid-19: Secondary | ICD-10-CM | POA: Insufficient documentation

## 2019-10-04 DIAGNOSIS — Z01812 Encounter for preprocedural laboratory examination: Secondary | ICD-10-CM | POA: Diagnosis not present

## 2019-10-04 LAB — SARS CORONAVIRUS 2 (TAT 6-24 HRS): SARS Coronavirus 2: NEGATIVE

## 2019-10-04 NOTE — Telephone Encounter (Signed)
Called and spoke with patient who states that she has not been able to find a ride to her procedure on 9/23. She states she has not heard from our office in regards to if we were able to find her transportation or if he insurance approved for her to stay overnight in the hospital. Patient has multiple questions I answered some the others would depend on if she can have procedure done in 3 days. She did get covid tested today.  Lauren do you have any information as far as above questions

## 2019-10-05 NOTE — Telephone Encounter (Signed)
ATC patient let her know we did find transportation through the service with [facility]. I was unable to reach patient. Left message that the transportation would be calling to her to confirm.  The number to the transportation office is [phone].

## 2019-10-06 ENCOUNTER — Telehealth: Payer: Self-pay | Admitting: Internal Medicine

## 2019-10-06 NOTE — Telephone Encounter (Signed)
Endoscopy spoke to [place] 10/06/2019 -> shge wants to know who will get patient tomorrow 10/07/19  as part of transporation and who will take her back home?  Thanks  MR

## 2019-10-06 NOTE — Telephone Encounter (Signed)
Left 2 voicemail's on patient's phone regarding prior message to call our office back .

## 2019-10-07 ENCOUNTER — Ambulatory Visit (HOSPITAL_COMMUNITY)
Admission: RE | Admit: 2019-10-07 | Discharge: 2019-10-07 | Disposition: A | Discharge status: Home / Self Care | Payer: Medicare Other | Attending: Internal Medicine | Admitting: Internal Medicine

## 2019-10-07 ENCOUNTER — Encounter (HOSPITAL_COMMUNITY): Payer: Self-pay | Admitting: Internal Medicine

## 2019-10-07 ENCOUNTER — Ambulatory Visit (HOSPITAL_COMMUNITY): Payer: Medicare Other | Admitting: Anesthesiology

## 2019-10-07 ENCOUNTER — Other Ambulatory Visit: Payer: Self-pay

## 2019-10-07 ENCOUNTER — Telehealth: Payer: Self-pay | Admitting: Internal Medicine

## 2019-10-07 ENCOUNTER — Encounter (HOSPITAL_COMMUNITY)
Admission: RE | Disposition: A | Discharge status: Home / Self Care | Payer: Self-pay | Source: Home / Self Care | Attending: Internal Medicine

## 2019-10-07 DIAGNOSIS — R042 Hemoptysis: Secondary | ICD-10-CM | POA: Diagnosis not present

## 2019-10-07 DIAGNOSIS — Z79899 Other long term (current) drug therapy: Secondary | ICD-10-CM | POA: Insufficient documentation

## 2019-10-07 DIAGNOSIS — J4520 Mild intermittent asthma, uncomplicated: Secondary | ICD-10-CM | POA: Diagnosis not present

## 2019-10-07 DIAGNOSIS — I10 Essential (primary) hypertension: Secondary | ICD-10-CM | POA: Diagnosis not present

## 2019-10-07 DIAGNOSIS — Z825 Family history of asthma and other chronic lower respiratory diseases: Secondary | ICD-10-CM | POA: Insufficient documentation

## 2019-10-07 DIAGNOSIS — E7800 Pure hypercholesterolemia, unspecified: Secondary | ICD-10-CM | POA: Insufficient documentation

## 2019-10-07 DIAGNOSIS — F17200 Nicotine dependence, unspecified, uncomplicated: Secondary | ICD-10-CM | POA: Insufficient documentation

## 2019-10-07 DIAGNOSIS — Z8619 Personal history of other infectious and parasitic diseases: Secondary | ICD-10-CM | POA: Insufficient documentation

## 2019-10-07 DIAGNOSIS — Z7983 Long term (current) use of bisphosphonates: Secondary | ICD-10-CM | POA: Diagnosis not present

## 2019-10-07 DIAGNOSIS — E785 Hyperlipidemia, unspecified: Secondary | ICD-10-CM | POA: Insufficient documentation

## 2019-10-07 DIAGNOSIS — E039 Hypothyroidism, unspecified: Secondary | ICD-10-CM | POA: Diagnosis not present

## 2019-10-07 DIAGNOSIS — M8580 Other specified disorders of bone density and structure, unspecified site: Secondary | ICD-10-CM | POA: Diagnosis not present

## 2019-10-07 DIAGNOSIS — Z8249 Family history of ischemic heart disease and other diseases of the circulatory system: Secondary | ICD-10-CM | POA: Diagnosis not present

## 2019-10-07 DIAGNOSIS — K589 Irritable bowel syndrome without diarrhea: Secondary | ICD-10-CM | POA: Diagnosis not present

## 2019-10-07 DIAGNOSIS — K219 Gastro-esophageal reflux disease without esophagitis: Secondary | ICD-10-CM | POA: Diagnosis not present

## 2019-10-07 HISTORY — PX: VIDEO BRONCHOSCOPY: SHX5072

## 2019-10-07 SURGERY — VIDEO BRONCHOSCOPY WITHOUT FLUORO
Anesthesia: Monitor Anesthesia Care

## 2019-10-07 MED ORDER — LIDOCAINE 2% (20 MG/ML) 5 ML SYRINGE
INTRAMUSCULAR | Status: DC | PRN
  Administered 2019-10-07: 60 mg via INTRAVENOUS

## 2019-10-07 MED ORDER — LIDOCAINE HCL (PF) 1 % IJ SOLN
INTRAMUSCULAR | Status: DC | PRN
  Administered 2019-10-07: 5 mL
  Administered 2019-10-07: 2 mL
  Administered 2019-10-07: 5 mL

## 2019-10-07 MED ORDER — PHENYLEPHRINE HCL 0.25 % NA SOLN: 1.0000 | Freq: Four times a day (QID) | NASAL | Status: DC | PRN

## 2019-10-07 MED ORDER — PROPOFOL 500 MG/50ML IV EMUL
INTRAVENOUS | Status: DC | PRN
  Administered 2019-10-07: 125 ug/kg/min via INTRAVENOUS

## 2019-10-07 MED ORDER — LIDOCAINE HCL URETHRAL/MUCOSAL 2 % EX GEL: 1.0000 "application " | Freq: Once | CUTANEOUS | Status: DC

## 2019-10-07 MED ORDER — PROPOFOL 500 MG/50ML IV EMUL
INTRAVENOUS | Status: AC
  Filled 2019-10-07: qty 50

## 2019-10-07 MED ORDER — BUTAMBEN-TETRACAINE-BENZOCAINE 2-2-14 % EX AERO: 1.0000 | INHALATION_SPRAY | Freq: Once | CUTANEOUS | Status: DC

## 2019-10-07 MED ORDER — LIDOCAINE 2% (20 MG/ML) 5 ML SYRINGE
INTRAMUSCULAR | Status: AC
  Filled 2019-10-07: qty 5

## 2019-10-07 MED ORDER — FENTANYL CITRATE (PF) 100 MCG/2ML IJ SOLN
INTRAMUSCULAR | Status: DC | PRN
Start: 2019-10-07 — End: 2019-10-07
  Administered 2019-10-07: 25 ug via INTRAVENOUS

## 2019-10-07 MED ORDER — FENTANYL CITRATE (PF) 100 MCG/2ML IJ SOLN
INTRAMUSCULAR | Status: AC
  Filled 2019-10-07: qty 2

## 2019-10-07 MED ORDER — PROPOFOL 10 MG/ML IV BOLUS
INTRAVENOUS | Status: AC
  Filled 2019-10-07: qty 20

## 2019-10-07 MED ORDER — LACTATED RINGERS IV SOLN
INTRAVENOUS | Status: DC
  Administered 2019-10-07: 1000 mL via INTRAVENOUS

## 2019-10-07 NOTE — Anesthesia Preprocedure Evaluation (Signed)
Anesthesia Evaluation  Patient identified by MRN, date of birth, ID band Patient awake    Reviewed: Allergy & Precautions, NPO status , Patient's Chart, lab work & pertinent test results  Airway Mallampati: II  TM Distance: >3 FB Neck ROM: Full    Dental no notable dental hx. (+) Dental Advisory Given   Pulmonary COPD, Current Smoker and Patient abstained from smoking.,    Pulmonary exam normal        Cardiovascular hypertension, Pt. on medications Normal cardiovascular exam     Neuro/Psych negative neurological ROS  negative psych ROS   GI/Hepatic Neg liver ROS, GERD  ,  Endo/Other  Hypothyroidism   Renal/GU negative Renal ROS  negative genitourinary   Musculoskeletal negative musculoskeletal ROS (+)   Abdominal   Peds negative pediatric ROS (+)  Hematology negative hematology ROS (+)   Anesthesia Other Findings   Reproductive/Obstetrics negative OB ROS                             Anesthesia Physical Anesthesia Plan  ASA: III  Anesthesia Plan: MAC   Post-op Pain Management:    Induction: Intravenous  PONV Risk Score and Plan: 2 and Ondansetron and Propofol infusion  Airway Management Planned: Natural Airway and Simple Face Mask  Additional Equipment:   Intra-op Plan:   Post-operative Plan:   Informed Consent: I have reviewed the patients History and Physical, chart, labs and discussed the procedure including the risks, benefits and alternatives for the proposed anesthesia with the patient or authorized representative who has indicated his/her understanding and acceptance.     Dental advisory given  Plan Discussed with: CRNA and Anesthesiologist  Anesthesia Plan Comments:         Anesthesia Quick Evaluation

## 2019-10-07 NOTE — Interval H&P Note (Signed)
No interim changes No further hemoptysis  Presents for bronch NPO confiormed  Exam - non focal  VSS   A Hemoptysis SMoker Emphysema  P Bronch airway exam - endobronchial bx if there is a lesion   Risks of pneumothorax, hemothorax, sedation/anesthesia complications such as cardiac or respiratory arrest or hypotension, stroke and bleeding all explained. Benefits of diagnosis but limitations of non-diagnosis also explained. Patient verbalized understanding and wished to proceed.      SIGNATURE    Dr. Brand Males, M.D., F.C.C.P,  Pulmonary and Critical Care Medicine Staff Physician, [place] Director - Interstitial Lung Disease  Program  Pulmonary [place] at [place], [place], [address]  Pager: [phone], If no answer  OR between  19:00-7:00h: page [phone] Telephone (clinical office): [phone] Telephone (research): [phone]  3:08 PM 10/07/2019

## 2019-10-07 NOTE — Transfer of Care (Signed)
Immediate Anesthesia Transfer of Care Note  Patient: Bridget Mcdonald  Procedure(s) Performed: VIDEO BRONCHOSCOPY WITHOUT FLUORO (N/A )  Patient Location: PACU  Anesthesia Type:MAC  Level of Consciousness: awake, alert  and oriented  Airway & Oxygen Therapy: Patient Spontanous Breathing and Patient connected to face mask oxygen  Post-op Assessment: Report given to RN, Post -op Vital signs reviewed and stable and Patient moving all extremities X 4  Post vital signs: Reviewed and stable  Last Vitals:  Vitals Value Taken Time  BP 143/53 10/07/19 1533  Temp    Pulse 84 10/07/19 1534  Resp 22 10/07/19 1534  SpO2 99 % 10/07/19 1534  Vitals shown include unvalidated device data.  Last Pain:  Vitals:   10/07/19 1533  TempSrc:   PainSc: 0-No pain         Complications: No complications documented.

## 2019-10-07 NOTE — Anesthesia Procedure Notes (Signed)
Procedure Name: MAC Date/Time: 10/07/2019 3:04 PM Performed by: Niel Hummer, CRNA Pre-anesthesia Checklist: Patient identified, Emergency Drugs available, Suction available and Patient being monitored Oxygen Delivery Method: Simple face mask

## 2019-10-07 NOTE — Telephone Encounter (Signed)
Attempted to call pt but unable to reach. Left message for her to return call. 

## 2019-10-07 NOTE — Telephone Encounter (Signed)
Bridget Mcdonald  The bronch was normal  Plan  - routine opd followup 3 months - pls give    SIGNATURE    Dr. Brand Males, M.D., F.C.C.P,  Pulmonary and Critical Care Medicine Staff Physician, [place] Director - Interstitial Lung Disease  Program  Pulmonary [place] [facility] [place], [place], [address]  Pager: [phone], If no answer  OR between  19:00-7:00h: page [phone] Telephone (clinical office): [phone] Telephone (research): [phone]  3:30 PM 10/07/2019

## 2019-10-07 NOTE — Discharge Instructions (Signed)
Your airway exam was normal  plan  Please have someone to drive you home Please be careful with activities for next 24 hours You can eat 2-4 hours after getting home provided you are fully alert, able to cough, and are not nauseated or vomiting and     feel well You are expected to have low grade fever or cough some amount of blood for next 24-48 hours; if this worsens call us IF you are very short of breath or coughing blood or chest pain or not feeling well, call us [phone]  anytime or go to emergency room Please call [phone] for followup appointment or we will call you and fix one

## 2019-10-07 NOTE — Op Note (Signed)
Name:  Conception Doebler MRN:  [id number] DOB:  17-Nov-1945  PROCEDURE NOTE  Procedure(s): Flexible bronchoscopy [phone])  Indications:  Smoker, hemoptysis  Consent:  Procedure, benefits, risks and alternatives discussed.  Questions answered.  Consent obtained.  Anesthesia:  Moderate Sedation  Location: [facility] OR 1  Procedure summary:  Appropriate equipment was assembled.  The patient was brought to the procedure suite room and identified as Sulema Braid with Sep 17, 1945  Safety timeout was performed. The patient was placed supine on the  table, airway and  MAC sedation adminstered by anesthesia   After the appropriate level of moderation was assured, flexible video bronchoscope was lubricated and inserted through the endotracheal tube.  Total of  1% Lidocaine were administered through the bronchoscope to augment moderate sedation.   Airway examination was YES performed bilaterally to subsegmental level.  Minimal clear secretions were noted, mucosa appeared normal and NO endobronchial lesions were identified. Patient coughed and with mild scope trauma in the carina easy bruising was noted   After hemostasis was assure, the bronchoscope was withdrawn.  The patient was recovered and then  transferred to recovery area  Post-procedure chest x-ray was NOT ordered.  Specimens sent: NONE  Complications:  No immediate complications were noted.  Hemodynamic parameters and oxygenation remained stable throughout the procedure.  Estimated blood loss:  NONE  IMPRESSION 1. Normal airwawy   Followup Future Appointments  Date Time Provider [place]  11/26/2019  8:00 AM Jerline Pain, MD [facility]     Dr. Brand Males, M.D., [facility].C.P Pulmonary and Critical Care Medicine Staff Physician [facility] Pager: [phone], If no answer or between  15:00h - 7:00h: call [phone]  10/07/2019 3:26  PM

## 2019-10-07 NOTE — Anesthesia Postprocedure Evaluation (Signed)
Anesthesia Post Note  Patient: Bridget Mcdonald  Procedure(s) Performed: VIDEO BRONCHOSCOPY WITHOUT FLUORO (N/A )     Patient location during evaluation: PACU Anesthesia Type: MAC Level of consciousness: awake and alert Pain management: pain level controlled Vital Signs Assessment: post-procedure vital signs reviewed and stable Respiratory status: spontaneous breathing and respiratory function stable Cardiovascular status: stable Postop Assessment: no apparent nausea or vomiting Anesthetic complications: no   No complications documented.  Last Vitals:  Vitals:   10/07/19 1542 10/07/19 1552  BP: (!) 149/60 (!) 147/51  Pulse: 82 78  Resp: (!) 36 18  Temp:    SpO2: 95% 93%    Last Pain:  Vitals:   10/07/19 1552  TempSrc:   PainSc: 0-No pain                 Alisa Stjames DANIEL

## 2019-10-07 NOTE — Telephone Encounter (Signed)
Pt was able to have the bronch 10/07/19. Nothing further needed.

## 2019-10-08 ENCOUNTER — Encounter (HOSPITAL_COMMUNITY): Payer: Self-pay | Admitting: Internal Medicine

## 2019-10-13 ENCOUNTER — Encounter: Payer: Self-pay | Admitting: *Deleted

## 2019-10-13 NOTE — Telephone Encounter (Signed)
Attempted to call pt but unable to reach. Left message for her to return call. Due to multiple attempts trying to reach pt and have not been able to do so, letter will be sent to pt and encounter will be closed.

## 2019-10-14 ENCOUNTER — Telehealth: Payer: Self-pay | Admitting: Internal Medicine

## 2019-10-14 DIAGNOSIS — D62 Acute posthemorrhagic anemia: Secondary | ICD-10-CM | POA: Diagnosis not present

## 2019-10-14 DIAGNOSIS — M810 Age-related osteoporosis without current pathological fracture: Secondary | ICD-10-CM | POA: Diagnosis not present

## 2019-10-14 DIAGNOSIS — E039 Hypothyroidism, unspecified: Secondary | ICD-10-CM | POA: Diagnosis not present

## 2019-10-14 DIAGNOSIS — I480 Paroxysmal atrial fibrillation: Secondary | ICD-10-CM | POA: Diagnosis not present

## 2019-10-14 DIAGNOSIS — E7800 Pure hypercholesterolemia, unspecified: Secondary | ICD-10-CM | POA: Diagnosis not present

## 2019-10-14 NOTE — Telephone Encounter (Signed)
Called and spoke with patient about results from [place]. Let her know that it was normal and for her to follow up in 3 months. She states that she is having coughing fits and that occosionaly she brings up sputum that is clear and it still has still spots of blood but not as much blood as it was.  She is asking how long this will be going on? Today makes a week.  Dr. Chase Caller please advise

## 2019-10-18 NOTE — Telephone Encounter (Signed)
Tried calling the pt and there was no answer- LMTCB and will hold to call 10/5

## 2019-10-18 NOTE — Telephone Encounter (Signed)
Given the nature of her airways - some specks of blood can be expected. If is large then is a concern. Or if there is a change from baseline then can always Rx for bronchitis with abx/pred each time  She can consider an inhaler like spiriva (has some emphysema on scan) and workw ith PCP [place], Bill Salinas, MD to change alendronate to something else - causes GERD to see if it can help alleviae cough -> thereby hemoptysis   Allergies  Allergen Reactions  . Demerol [Meperidine]     hallucinations  . Epinephrine     Super hyper  . Gabapentin Diarrhea  . Quinine Derivatives     Temporary loss of hearing      Current Outpatient Medications:  .  acetaminophen (TYLENOL) 325 MG tablet, Take 325 mg by mouth every 6 (six) hours as needed for moderate pain., Disp: , Rfl:  .  albuterol (VENTOLIN HFA) 108 (90 Base) MCG/ACT inhaler, Inhale 2 puffs into the lungs every 6 (six) hours as needed for wheezing or shortness of breath. DX; J43.9, Disp: 8 g, Rfl: 2 .  alendronate (FOSAMAX) 70 MG tablet, Take 70 mg by mouth every Saturday. Take with a full glass of water on an empty stomach. , Disp: , Rfl:  .  Ascorbic Acid (VITAMIN C) 500 MG CHEW, Chew 500-1,000 mg by mouth daily., Disp: , Rfl:  .  aspirin 325 MG tablet, Take 325 mg by mouth every 6 (six) hours as needed for moderate pain., Disp: , Rfl:  .  Calcium Carb-Cholecalciferol (CALCIUM 1000 + D) 1000-800 MG-UNIT TABS, Take 1 tablet by mouth daily., Disp: , Rfl:  .  diltiazem (CARDIZEM CD) 240 MG 24 hr capsule, Take 1 capsule (240 mg total) by mouth daily. Please keep upcoming appt in November with Dr. Marlou Porch before anymore refills. Thank you, Disp: 90 capsule, Rfl: 1 .  diltiazem (CARDIZEM) 60 MG tablet, Take 1 tablet (60 mg total) by mouth as needed. (Patient taking differently: Take 60 mg by mouth every 6 (six) hours as needed (increased heart rate). ), Disp: 30 tablet, Rfl: 1 .  hyoscyamine (NULEV) 0.125 MG TBDP disintergrating tablet, Place 0.125 mg  under the tongue every 4 (four) hours as needed (GI spasms). , Disp: , Rfl:  .  levothyroxine (SYNTHROID) 150 MCG tablet, Take 150 mcg by mouth daily before breakfast., Disp: , Rfl:  .  meclizine (ANTIVERT) 25 MG tablet, Take 25 mg by mouth 3 (three) times daily as needed for dizziness., Disp: , Rfl:  .  Multiple Vitamin (MULTIVITAMIN) tablet, Take 1 tablet by mouth daily. , Disp: , Rfl:  .  potassium chloride (KLOR-CON) 10 MEQ tablet, TAKE ONE TABLET BY MOUTH DAILY (Patient taking differently: Take 10 mEq by mouth daily. ), Disp: 90 tablet, Rfl: 3 .  rosuvastatin (CRESTOR) 20 MG tablet, Take 1 tablet (20 mg total) by mouth daily., Disp: 90 tablet, Rfl: 3 .  traZODone (DESYREL) 50 MG tablet, Take 25-50 mg by mouth at bedtime as needed for sleep., Disp: , Rfl:

## 2019-10-19 NOTE — Telephone Encounter (Signed)
Called and spoke with pt letting her know the info stated by MR and she verbalized understanding. Nothing further needed. 

## 2019-10-19 NOTE — Telephone Encounter (Signed)
Pt returning call.  [phone]

## 2019-11-15 ENCOUNTER — Other Ambulatory Visit: Payer: Self-pay | Admitting: Family Medicine

## 2019-11-15 DIAGNOSIS — Z1231 Encounter for screening mammogram for malignant neoplasm of breast: Secondary | ICD-10-CM

## 2019-11-15 DIAGNOSIS — E2839 Other primary ovarian failure: Secondary | ICD-10-CM

## 2019-11-17 DIAGNOSIS — E039 Hypothyroidism, unspecified: Secondary | ICD-10-CM | POA: Diagnosis not present

## 2019-11-17 DIAGNOSIS — H8110 Benign paroxysmal vertigo, unspecified ear: Secondary | ICD-10-CM | POA: Diagnosis not present

## 2019-11-17 DIAGNOSIS — K589 Irritable bowel syndrome without diarrhea: Secondary | ICD-10-CM | POA: Diagnosis not present

## 2019-11-17 DIAGNOSIS — Z23 Encounter for immunization: Secondary | ICD-10-CM | POA: Diagnosis not present

## 2019-11-17 DIAGNOSIS — I480 Paroxysmal atrial fibrillation: Secondary | ICD-10-CM | POA: Diagnosis not present

## 2019-11-17 DIAGNOSIS — S2341XA Sprain of ribs, initial encounter: Secondary | ICD-10-CM | POA: Diagnosis not present

## 2019-11-17 DIAGNOSIS — Z0000 Encounter for general adult medical examination without abnormal findings: Secondary | ICD-10-CM | POA: Diagnosis not present

## 2019-11-17 DIAGNOSIS — H6123 Impacted cerumen, bilateral: Secondary | ICD-10-CM | POA: Diagnosis not present

## 2019-11-17 DIAGNOSIS — E7800 Pure hypercholesterolemia, unspecified: Secondary | ICD-10-CM | POA: Diagnosis not present

## 2019-11-17 DIAGNOSIS — R042 Hemoptysis: Secondary | ICD-10-CM | POA: Diagnosis not present

## 2019-11-17 DIAGNOSIS — M810 Age-related osteoporosis without current pathological fracture: Secondary | ICD-10-CM | POA: Diagnosis not present

## 2019-11-17 DIAGNOSIS — F331 Major depressive disorder, recurrent, moderate: Secondary | ICD-10-CM | POA: Diagnosis not present

## 2019-11-26 ENCOUNTER — Ambulatory Visit (INDEPENDENT_AMBULATORY_CARE_PROVIDER_SITE_OTHER): Payer: Medicare Other | Admitting: Cardiology

## 2019-11-26 ENCOUNTER — Encounter: Payer: Self-pay | Admitting: Cardiology

## 2019-11-26 ENCOUNTER — Other Ambulatory Visit: Payer: Self-pay

## 2019-11-26 VITALS — BP 140/60 | HR 81 | Ht 67.0 in | Wt 151.0 lb

## 2019-11-26 DIAGNOSIS — I480 Paroxysmal atrial fibrillation: Secondary | ICD-10-CM

## 2019-11-26 DIAGNOSIS — I10 Essential (primary) hypertension: Secondary | ICD-10-CM | POA: Diagnosis not present

## 2019-11-26 DIAGNOSIS — E785 Hyperlipidemia, unspecified: Secondary | ICD-10-CM

## 2019-11-26 MED ORDER — DILTIAZEM HCL 60 MG PO TABS
60.0000 mg | ORAL_TABLET | ORAL | 1 refills | Status: DC | PRN
Start: 2019-11-26 — End: 2021-08-29

## 2019-11-26 MED ORDER — ROSUVASTATIN CALCIUM 20 MG PO TABS: 20.0000 mg | ORAL_TABLET | Freq: Every day | ORAL | 3 refills | Status: DC

## 2019-11-26 MED ORDER — POTASSIUM CHLORIDE ER 10 MEQ PO TBCR: 10.0000 meq | EXTENDED_RELEASE_TABLET | Freq: Every day | ORAL | 3 refills | Status: DC

## 2019-11-26 MED ORDER — DILTIAZEM HCL ER COATED BEADS 240 MG PO CP24: 240.0000 mg | ORAL_CAPSULE | Freq: Every day | ORAL | 3 refills | Status: DC

## 2019-11-26 NOTE — Progress Notes (Signed)
Cardiology Office Note:    Date:  11/26/2019   ID:  Bridget Mcdonald, DOB September 10, 1945, MRN [id number]  PCP:  Aretta Nip, MD  [facility] Cardiologist:  Candee Furbish, MD  [facility] Electrophysiologist:  None   Referring MD: Aretta Nip, MD     History of Present Illness:    Bridget Mcdonald is a 74 y.o. female here for the follow-up of paroxysmal atrial fibrillation.  Has seen Roderic Palau.  Originally hesitant to utilize anticoagulation.  Negative stress test Holter monitor showed frequent PVCs.  Had been worked up previously for temporal arteritis.  Patient is felt when laying down at night.  Definitely had atrial fibrillation in the past.  Takes diltiazem if needed.  Has been worked up for hemoptysis with pulmonary.  CT scan as small focal aortic calcifications.  Past Medical History:  Diagnosis Date  . Acid reflux   . Allergy history unknown   . Asthma, mild intermittent   . Barrett's esophagus    [place]  . Cancer ([place])    skin, breast (left)  . Chronic headaches   . Cyst of left kidney    mult-locular Dr.Dahlstedt  . Hematuria    Dr. Zannie Cove  . High cholesterol   . History of cardiac arrhythmia   . History of colon polyps    04/2009 [place] unknown histology  . Hyperlipidemia   . Hypertension   . Hypothyroidism   . Hypotonia    [name] 09/2012  . IBS (irritable bowel syndrome)   . Incontinence, feces    09/2012  . Migraine headache   . Osteopenia   . Palpitations 12/01/2012   Echocardiogram - 7/13-normal EF, trace valvular lesions, mildly AI  . PVC's (premature ventricular contractions)   . Vertigo   . Vestibular neuronitis    herpetic  . Vocal cord polyps    sees DR. Cristina Gong    Past Surgical History:  Procedure Laterality Date  . APPENDECTOMY  1964  . BREAST LUMPECTOMY Left 2005  . COLONOSCOPY  04/2009  . OOPHORECTOMY  1968?  Marland Kitchen SKIN SURGERY  08/2012  . TONSILECTOMY, ADENOIDECTOMY, BILATERAL  MYRINGOTOMY AND TUBES  1961  . TUBAL LIGATION    . VIDEO BRONCHOSCOPY N/A 10/07/2019   Procedure: VIDEO BRONCHOSCOPY WITHOUT FLUORO;  Surgeon: Brand Males, MD;  Location: [facility];  Service: Cardiopulmonary;  Laterality: N/A;    Current Medications: Current Meds  Medication Sig  . acetaminophen (TYLENOL) 325 MG tablet Take 325 mg by mouth every 6 (six) hours as needed for moderate pain.  Marland Kitchen albuterol (VENTOLIN HFA) 108 (90 Base) MCG/ACT inhaler Inhale 2 puffs into the lungs every 6 (six) hours as needed for wheezing or shortness of breath. DX; J43.9  . alendronate (FOSAMAX) 70 MG tablet Take 70 mg by mouth every Saturday. Take with a full glass of water on an empty stomach.   . Ascorbic Acid (VITAMIN C) 500 MG CHEW Chew 500-1,000 mg by mouth daily.  Marland Kitchen aspirin 325 MG tablet Take 325 mg by mouth every 6 (six) hours as needed for moderate pain.  . Calcium Carb-Cholecalciferol (CALCIUM 1000 + D) 1000-800 MG-UNIT TABS Take 1 tablet by mouth daily.  Marland Kitchen diltiazem (CARDIZEM CD) 240 MG 24 hr capsule Take 1 capsule (240 mg total) by mouth daily.  Marland Kitchen diltiazem (CARDIZEM) 60 MG tablet Take 1 tablet (60 mg total) by mouth as needed.  . hyoscyamine (NULEV) 0.125 MG TBDP disintergrating tablet Place 0.125 mg under the tongue every 4 (four)  hours as needed (GI spasms).   Marland Kitchen levothyroxine (SYNTHROID) 150 MCG tablet Take 150 mcg by mouth daily before breakfast.  . meclizine (ANTIVERT) 25 MG tablet Take 25 mg by mouth 3 (three) times daily as needed for dizziness.  . Multiple Vitamin (MULTIVITAMIN) tablet Take 1 tablet by mouth daily.   . potassium chloride (KLOR-CON) 10 MEQ tablet Take 1 tablet (10 mEq total) by mouth daily.  . rosuvastatin (CRESTOR) 20 MG tablet Take 1 tablet (20 mg total) by mouth daily.  . [DISCONTINUED] diltiazem (CARDIZEM CD) 240 MG 24 hr capsule Take 1 capsule (240 mg total) by mouth daily. Please keep upcoming appt in November with Dr. Marlou Porch before anymore refills. Thank you  .  [DISCONTINUED] diltiazem (CARDIZEM) 60 MG tablet Take 1 tablet (60 mg total) by mouth as needed.  . [DISCONTINUED] potassium chloride (KLOR-CON) 10 MEQ tablet TAKE ONE TABLET BY MOUTH DAILY  . [DISCONTINUED] rosuvastatin (CRESTOR) 20 MG tablet Take 1 tablet (20 mg total) by mouth daily.     Allergies:   Demerol [meperidine], Epinephrine, Gabapentin, and Quinine derivatives   Social History   Socioeconomic History  . Marital status: Single    Spouse name: Not on file  . Number of children: 1  . Years of education: Post Grad  . Highest education level: Not on file  Occupational History  . Occupation: Retired     Comment:  Secretary/administrator  Tobacco Use  . Smoking status: Current Every Day Smoker    Packs/day: 0.50    Years: 50.00    Pack years: 25.00    Types: Cigarettes  . Smokeless tobacco: Never Used  . Tobacco comment: 5cigs per day as of 12/28  Vaping Use  . Vaping Use: Never used  Substance and Sexual Activity  . Alcohol use: Yes    Comment: rare  . Drug use: No  . Sexual activity: Not on file  Other Topics Concern  . Not on file  Social History Narrative   Lives at home with herself.   Caffeine use: 2 cups per day.   Right-handed   Social Determinants of Health   Financial Resource Strain:   . Difficulty of Paying Living Expenses: Not on file  Food Insecurity:   . Worried About Charity fundraiser in the Last Year: Not on file  . Ran Out of Food in the Last Year: Not on file  Transportation Needs:   . Lack of Transportation (Medical): Not on file  . Lack of Transportation (Non-Medical): Not on file  Physical Activity:   . Days of Exercise per Week: Not on file  . Minutes of Exercise per Session: Not on file  Stress:   . Feeling of Stress : Not on file  Social Connections:   . Frequency of Communication with Friends and Family: Not on file  . Frequency of Social Gatherings with Friends and Family: Not on file  . Attends Religious Services: Not on file  . Active  Member of Clubs or Organizations: Not on file  . Attends Archivist Meetings: Not on file  . Marital Status: Not on file     Family History: The patient's family history includes Bladder Cancer in an other family member; Cancer in her brother and father; Emphysema in her mother; Heart disease in her paternal grandfather.  ROS:   Please see the history of present illness.     All other systems reviewed and are negative.  EKGs/Labs/Other Studies Reviewed:    The following  studies were reviewed today:   Event monitor 11/28/15:  The longest AF episode lasted for 09:08:42  Episose of atrial fibrilation detected. Peak HR 150bpm  ECHO 2020:  1. Left ventricular ejection fraction, by visual estimation, is 60 to  65%. The left ventricle has normal function. There is no left ventricular  hypertrophy.  2. Global right ventricle has normal systolic function.The right  ventricular size is normal. No increase in right ventricular wall  thickness.  3. Left atrial size was normal.  4. Right atrial size was normal.  5. The mitral valve is normal in structure. No evidence of mitral valve  regurgitation. No evidence of mitral stenosis.  6. The tricuspid valve is normal in structure. Tricuspid valve  regurgitation is not demonstrated.  7. Aortic valve regurgitation is mild to moderate.  8. The aortic valve is tricuspid. Aortic valve regurgitation is mild to  moderate. Mild aortic valve sclerosis without stenosis.  9. The pulmonic valve was normal in structure. Pulmonic valve  regurgitation is not visualized.  10. The inferior vena cava is normal in size with greater than 50%  respiratory variability, suggesting right atrial pressure of 3 mmHg.   EKG:  EKG is  ordered today.  The ekg ordered today demonstrates sinus rhythm 81 no other abnormalities  Recent Labs: 09/09/2019: ALT 15; BUN 11; Creatinine, Ser 0.86; Hemoglobin 14.5; Platelets 277.0; Potassium 3.5; Sodium 140   Recent Lipid Panel    Component Value Date/Time   CHOL 124 03/17/2019 1434   TRIG 118 03/17/2019 1434   HDL 46 03/17/2019 1434   CHOLHDL 2.7 03/17/2019 1434   LDLCALC 57 03/17/2019 1434     Risk Assessment/Calculations:     CHA2DS2-VASc Score = 4  This indicates a 4.8% annual risk of stroke. The patient's score is based upon: CHF History: 0 HTN History: 1 Diabetes History: 0 Stroke History: 0 Vascular Disease History: 1 Age Score: 1 Gender Score: 1      Physical Exam:    VS:  BP 140/60   Pulse 81   Ht 5\' 7"  (1.702 m)   Wt 151 lb (68.5 kg)   LMP  (LMP Unknown)   SpO2 96%   BMI 23.65 kg/m     Wt Readings from Last 3 Encounters:  11/26/19 151 lb (68.5 kg)  09/16/19 154 lb 6.4 oz (70 kg)  01/11/19 156 lb 12.8 oz (71.1 kg)     GEN:  Well nourished, well developed in no acute distress HEENT: Normal NECK: No JVD; No carotid bruits LYMPHATICS: No lymphadenopathy CARDIAC: RRR, no murmurs, rubs, gallops RESPIRATORY:  Clear to auscultation without rales, wheezing or rhonchi  ABDOMEN: Soft, non-tender, non-distended MUSCULOSKELETAL:  No edema; No deformity  SKIN: Warm and dry NEUROLOGIC:  Alert and oriented x 3 PSYCHIATRIC:  Normal affect   ASSESSMENT:    1. Paroxysmal atrial fibrillation (HCC)   2. Hyperlipidemia, unspecified hyperlipidemia type   3. Essential hypertension    PLAN:    In order of problems listed above:  Paroxysmal atrial fibrillation --More and more episodes, feels "crappy". Chest lump feel, like valve stick. Reached into refrigerator and it started. 11:30pm. Felt shaky, sweaty, nausea, upset stomach. Deep breaths. 6am. 7am took meds. She stays up all night usually. Heart was not racing. 88bpm, felt irregular, felt the skips. Had to lean on sink and breathe deep, weak. Happened a few months before as well. Could not unload groceries, felt SOB. Next day, felt fine.  Right ear pain next day. Jaw.  --  Ate next night, palpitations started, but  heart rate 60. Felt skip every 10, 5, 4 beats. Finally got it down. Did not take the 60 of diltiazem.  -Previously encouraged anticoagulation given CHA2DS2-VASc of 4. -Had had some hemoptysis being worked up, GI bleeding/rectal bleeding.  Dr. Cristina Gong had said it was okay to utilize anticoagulation in the past. -Continue with diltiazem 240 once a day. -Okay to utilize diltiazem 60 short acting with heart rates even in the 80s.  History of chest pain/claudication -Lower extremity Dopplers nuclear stress test reassuring.  Feels clicking in her chest wall at times could be musculoskeletal.  Aortic atherosclerosis -Currently on rosuvastatin 20 mg a day.  Excellent.  LDL goal less than 70.  LDL 62 at last check.  Excellent.  Hemoglobin 14.5 hemoglobin A1c 5.6 creatinine 0.8.  Doing well without any myalgias.  Tobacco use -Continue to encourage cessation. -Okay to use Wellbutrin for the depression as well.    Shared Decision Making/Informed Consent      Medication Adjustments/Labs and Tests Ordered: Current medicines are reviewed at length with the patient today.  Concerns regarding medicines are outlined above.  Orders Placed This Encounter  Procedures  . EKG 12-Lead   Meds ordered this encounter  Medications  . rosuvastatin (CRESTOR) 20 MG tablet    Sig: Take 1 tablet (20 mg total) by mouth daily.    Dispense:  90 tablet    Refill:  3    D/C atorvastatin  . potassium chloride (KLOR-CON) 10 MEQ tablet    Sig: Take 1 tablet (10 mEq total) by mouth daily.    Dispense:  90 tablet    Refill:  3  . diltiazem (CARDIZEM CD) 240 MG 24 hr capsule    Sig: Take 1 capsule (240 mg total) by mouth daily.    Dispense:  90 capsule    Refill:  3  . diltiazem (CARDIZEM) 60 MG tablet    Sig: Take 1 tablet (60 mg total) by mouth as needed.    Dispense:  30 tablet    Refill:  1    Patient Instructions  Medication Instructions:  The current medical regimen is effective;  continue present plan  and medications.  *If you need a refill on your cardiac medications before your next appointment, please call your pharmacy*  Follow-Up: At [facility], you and your health needs are our priority.  As part of our continuing mission to provide you with exceptional heart care, we have created designated Provider Care Teams.  These Care Teams include your primary Cardiologist (physician) and Advanced Practice Providers (APPs -  Physician Assistants and Nurse Practitioners) who all work together to provide you with the care you need, when you need it.  We recommend signing up for the patient portal called "MyChart".  Sign up information is provided on this After Visit Summary.  MyChart is used to connect with patients for Virtual Visits (Telemedicine).  Patients are able to view lab/test results, encounter notes, upcoming appointments, etc.  Non-urgent messages can be sent to your provider as well.   To learn more about what you can do with MyChart, go to [email or web address].    Your next appointment:   12 month(s)  The format for your next appointment:   In Person  Provider:   Candee Furbish, MD   Thank you for choosing [facility]!!        Signed, Candee Furbish, MD  11/26/2019 8:59 AM    [place]

## 2019-11-26 NOTE — Patient Instructions (Signed)
Medication Instructions:  The current medical regimen is effective;  continue present plan and medications.  *If you need a refill on your cardiac medications before your next appointment, please call your pharmacy*  Follow-Up: At [facility], you and your health needs are our priority.  As part of our continuing mission to provide you with exceptional heart care, we have created designated Provider Care Teams.  These Care Teams include your primary Cardiologist (physician) and Advanced Practice Providers (APPs -  Physician Assistants and Nurse Practitioners) who all work together to provide you with the care you need, when you need it.  We recommend signing up for the patient portal called "MyChart".  Sign up information is provided on this After Visit Summary.  MyChart is used to connect with patients for Virtual Visits (Telemedicine).  Patients are able to view lab/test results, encounter notes, upcoming appointments, etc.  Non-urgent messages can be sent to your provider as well.   To learn more about what you can do with MyChart, go to [email or web address].    Your next appointment:   12 month(s)  The format for your next appointment:   In Person  Provider:   Mark Skains, MD   Thank you for choosing [facility]!!      

## 2019-12-06 DIAGNOSIS — Z23 Encounter for immunization: Secondary | ICD-10-CM | POA: Diagnosis not present

## 2019-12-06 DIAGNOSIS — K219 Gastro-esophageal reflux disease without esophagitis: Secondary | ICD-10-CM | POA: Diagnosis not present

## 2019-12-06 DIAGNOSIS — M810 Age-related osteoporosis without current pathological fracture: Secondary | ICD-10-CM | POA: Diagnosis not present

## 2019-12-06 DIAGNOSIS — I480 Paroxysmal atrial fibrillation: Secondary | ICD-10-CM | POA: Diagnosis not present

## 2019-12-06 DIAGNOSIS — E7800 Pure hypercholesterolemia, unspecified: Secondary | ICD-10-CM | POA: Diagnosis not present

## 2019-12-06 DIAGNOSIS — D62 Acute posthemorrhagic anemia: Secondary | ICD-10-CM | POA: Diagnosis not present

## 2019-12-06 DIAGNOSIS — E039 Hypothyroidism, unspecified: Secondary | ICD-10-CM | POA: Diagnosis not present

## 2019-12-06 DIAGNOSIS — F331 Major depressive disorder, recurrent, moderate: Secondary | ICD-10-CM | POA: Diagnosis not present

## 2019-12-27 DIAGNOSIS — I480 Paroxysmal atrial fibrillation: Secondary | ICD-10-CM | POA: Diagnosis not present

## 2019-12-27 DIAGNOSIS — E039 Hypothyroidism, unspecified: Secondary | ICD-10-CM | POA: Diagnosis not present

## 2019-12-27 DIAGNOSIS — F331 Major depressive disorder, recurrent, moderate: Secondary | ICD-10-CM | POA: Diagnosis not present

## 2019-12-27 DIAGNOSIS — D62 Acute posthemorrhagic anemia: Secondary | ICD-10-CM | POA: Diagnosis not present

## 2019-12-27 DIAGNOSIS — M810 Age-related osteoporosis without current pathological fracture: Secondary | ICD-10-CM | POA: Diagnosis not present

## 2019-12-27 DIAGNOSIS — K219 Gastro-esophageal reflux disease without esophagitis: Secondary | ICD-10-CM | POA: Diagnosis not present

## 2019-12-27 DIAGNOSIS — E7800 Pure hypercholesterolemia, unspecified: Secondary | ICD-10-CM | POA: Diagnosis not present

## 2020-02-01 ENCOUNTER — Encounter: Payer: Self-pay | Admitting: Internal Medicine

## 2020-02-01 ENCOUNTER — Ambulatory Visit (INDEPENDENT_AMBULATORY_CARE_PROVIDER_SITE_OTHER): Payer: Medicare Other | Admitting: Internal Medicine

## 2020-02-01 ENCOUNTER — Other Ambulatory Visit: Payer: Self-pay

## 2020-02-01 VITALS — BP 118/74 | HR 84 | Temp 97.6°F | Ht 66.5 in | Wt 155.4 lb

## 2020-02-01 DIAGNOSIS — J449 Chronic obstructive pulmonary disease, unspecified: Secondary | ICD-10-CM

## 2020-02-01 MED ORDER — BUDESONIDE-FORMOTEROL FUMARATE 80-4.5 MCG/ACT IN AERO: 2.0000 | INHALATION_SPRAY | Freq: Two times a day (BID) | RESPIRATORY_TRACT | 12 refills | Status: DC

## 2020-02-01 NOTE — Progress Notes (Signed)
HPI  IOV 11/03/2018  Chief Complaint  Patient presents with  . Consult     Bridget Mcdonald is 75 y.o. 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  She is a heavy smoker.  She has multiple complaints with the main reason for referral and the main complaint currently is 1 of hemoptysis ongoing for couple of months.  It is new in onset.  She believes she might have something autoimmune going on.  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  In addition for the last 2 or 3 years she has had dry skin in her lower extremities and the skin is flaky.  She states that the etiologic diagnosis for both these conditions is idiopathic.  Then approximately 2 at the end of August she started developing hemoptysis.  Since then it has persisted although the active frank blood is no longer present since October 04, 2018 or so.  She states the first episode of hemoptysis started August 27, 2018 and went through the whole weekend.  Then around September 06, 2018 she was in bed with symptoms ranging from coughing of blood clots fatigue, freezing cold, nausea and uncontrollable diarrhea and a sense that her atrial fibrillation was not under control.  The following week she did have right neck and ear pain with pressure pain in the left lower lateral lung in the lower part.  Fatigue also started.  She had a CT scan of the chest on September 9 that shows right upper lobe posterior segment groundglass opacities consistent with local alveolar hemorrhage in my personal visualization and opinion.  However she points to the left lower lobe infrascapular area as a point of discomfort.  In this area there is a small subpleural nodularity.  Then from mid September through October 10, 2018 she took Avelox she continued to have fatigue and  anxiety.  Around this timeframe hemoptysis resolved.  She says this is mainly because she is controlling her cough and clearing her throat.  But the chest pressure has persisted.  She feels the neuropathy has worsened.  She feels the same amount of edema as well.  She is also feels she is constantly hungry and rapidly gaining weight  She continues to smoke heavily.  Review of labs show nonbacterial microscopic hematuria in 2017.  She does not recollect taking antibiotics for this.  She had an echocardiogram in 2017 that was normal but she says she has a history of mitral valve prolapse.  She also has shortness of breath with exertion relieved by rest although walking desaturation test showed she did not desaturate.  She walked 185 feet x 3 laps in office with a resting pulse ox of 99% and the resting heart rate of 96/min.  When she finished she was pulse ox 100% and a heart rate of 117/min.  IMPRESSION: CT chest 1. Interlobular septal line thickening and ground-glass opacities in the medial right upper lobe. This is nonspecific, but can see be seen in pulmonary hemorrhage syndromes, pneumonia, and pulmonary alveolar proteinosis. 2. Multiple bilateral solid and ground-glass pulmonary nodules. Non-contrast chest CT at 3-6 months is recommended. If the nodules are stable at time of repeat CT, then future CT at 18-24 months (from today's scan) is considered optional for low-risk patients, but  is recommended for high-risk patients. This recommendation follows the consensus statement: Guidelines for Management of Incidental Pulmonary Nodules Detected on CT Images: From the Fleischner Society 2017; Radiology 2017; 284:228-243.  Aortic Atherosclerosis (ICD10-I70.0).   Electronically Signed   By: Zerita Boers M.D.   On: 09/24/2018 09:40    OV 01/11/2019  Subjective:  Patient ID: Bridget Mcdonald, female , DOB: 1945-07-11 , age 74 y.o. , MRN: [id number] , ADDRESS: [place] [address]   01/11/2019 -   Chief Complaint  Patient presents with  . Follow-up    Pt states she has been doing okay since last visit. Pt said she has been coughing up green-yellow phlegm which has been going on x4 days.     HPI Bridget Mcdonald 75 y.o. -presents for follow-up of review of her results.  Since her last visit in October she has not had any more hemoptysis.  She had a CT scan of the chest that I personally visualized.  The pulmonary infiltrate is resolved.  The nodules have improved.  She tells me that her smoking has relapsed.  There is a history of microscopic hematuria but her repeat UA shows only 3-6 RBCs per high-power field.  At this point in time she says that she has been isolating pretty well.  There is no clusters for COVID-19.  However for the last day or so she has had cough increase with yellow-green phlegm.  She thinks is because her smoking has relapsed.  She thinks if she quit smoking this will improve.  She is not keen on antibiotics of prednisone because of multiple different allergies.  We reviewed her pulmonary function test from 2014 and this shows isolated reduction diffusion capacity.  Her current CT chest in my personal visualization shows some emphysema although this is not reported in the official report.  She admits to anxiety particularly when she quit smoking.   IMPRESSION:  Lungs/Pleura: Resolution of the region of ground-glass opacity and pleural nodularity in the RIGHT upper lobe. There is minimal residual pleuroparenchymal nodularity measuring 16 mm x 9 mm decreased from 22 mm x 12 mm on prior (image 24/8).  Smaller scattered pulmonary nodules also are improved.  For example RIGHT lower lobe nodule measuring 5 mm (image 90/8) compares to 7 mm.  Several adjacent LEFT lower lobe nodules are also decreased. For example 2 mm nodule (image 80/8) decreased from 7 mm.  LEFT upper lobe nodule previously on image 36/3 has  resolved completely.  1. Near complete resolution of RIGHT upper lobe ground-glass opacity and pleural nodularity. Interval decrease in size of bilateral pulmonary nodules. Findings consistent with resolving infectious or inflammatory process. 2. Aortic Atherosclerosis (ICD10-I70.0).   Electronically Signed   By: Suzy Bouchard M.D.   On: 01/04/2019 08:13   Results for JAYLNN, ULLERY (MRN [id number]) as of 01/11/2019 09:23  Ref. Range 11/03/2018 12:24  ANA Titer 1 Unknown Negative  ANCA Proteinase 3 Latest Ref Range: 0.0 - 3.5 U/mL <3.5  Angiotensin-Converting Enzyme Latest Ref Range: 9 - 67 U/L 20  Cyclic Citrullin Peptide Ab Latest Units: UNITS <16  dsDNA Ab Latest Ref Range: 0 - 9 IU/mL <1  ENA RNP Ab Latest Ref Range: 0.0 - 0.9 AI <0.2  ENA SSA (RO) Ab Latest Ref Range: 0.0 - 0.9 AI <0.2  ENA SSB (LA) Ab Latest Ref Range: 0.0 - 0.9 AI <0.2  Myeloperoxidase Abs Latest Units: AI <1.0  Serine Protease 3 Latest Units: AI <1.0  RA Latex  Turbid. Latest Ref Range: <14 IU/mL 24 (H)  ENA SM Ab Ser-aCnc Latest Ref Range: 0.0 - 0.9 AI <0.2  Scleroderma (Scl-70) (ENA) Antibody, IgG Latest Ref Range: 0.0 - 0.9 AI <0.2    Results for MERCEDEZ, BOULE (MRN [id number]) as of 01/11/2019 09:23  Ref. Range 11/03/2018 12:24  RBC / HPF Latest Ref Range: 0-2/hpf  3-6/hpf (A)  Results for KEIERRA, NUDO (MRN [id number]) as of 01/11/2019 09:23  Ref. Range 04/25/2015 15:49  RBC / HPF Latest Ref Range: 0 - 5 RBC/hpf TOO NUMEROUS TO COUNT   Sep 09, 2019 tele visit  52-year-old female active smoker seen for pulmonary consult October 2020 for hemoptysis Retired Investment banker, corporate, from [place].  Today's televisit is for an acute work in for hemoptysis.  Patient has had several episodes of hemoptysis over the last year.  This initially started about 1 year ago.  She was seen for pulmonary consult in October 2020.  CT chest showed groundglass opacities in the middle right upper lobe.   And multiple solid and groundglass pulmonary nodules scattered.  She had autoimmune testing that was essentially unrevealing. Follow-up CT chest December 2020 showed near complete resolution of right upper lobe groundglass opacity.  And decreased size of bilateral pulmonary nodules. Recent CT chest August 09, 2019 showed further improvement in the infectious postinflammatory interstitial thickening.  And some new right-sided pulmonary nodules maximum 4 mm. Patient complains that approximately 1 to 2 days ago she started coughing up some blood again. She has some increased cough and congestion.  Has increased fatigue.  Has some increased chest tightness.  Patient was recommended to begin Keflex 500 mg 3 times daily for 5 days.  And to be set up for a bronchoscopy.  Patient says she continues to have some increased congestion has not picked up her antibiotics.  We went over recommendations for her bronchoscopy with patient education given.  Patient says unfortunately she has no transportation as she has no one to take her or pick her up from the procedure.  Given patient's recurrent hemoptysis increased shortness of breath and inability for procedure.  I recommend patient seek emergency room care as inability to evaluate properly via televisit.  Patient declines says she has not going to the emergency room. Patient does say that she will come in and get labs and a chest x-ray.  And wants [place] to try to find her medical transportation for procedure.    Observations/Objective: CT chest 07/2019 -Further improvement in previously described post infectious/inflammatory interstitial thickening, consistent with scarring. 2. New right-sided pulmonary nodules x2, 3 maximally 4 mm. Non-contrast chest CT can be considered in 12 months, given risk factors for primary bronchogenic carcinoma.   Assessment and Plan: Recurrent hemoptysis and active smoker.  Questionable etiology.  Unable to adequately assess patient via  televisit.  Have recommended emergency room/urgent care evaluation patient declines.  Patient says she will come to the office for chest x-ray and labs if she can get here before our office closes. Per Dr. Marchelle Gearing with request for bronchoscopy.  Lab work will be done including CBC c-Met and a PT/INR. We will contact community resources see if medical transportation is an option for this patient.  Plan  Patient Instructions  Take antibiotics as directed Come to the office for a chest x-ray and labs We will call you with information regarding bronchoscopy Follow-up in the office with Dr. Marchelle Gearing in 1 week and as needed Please contact office for sooner follow up if symptoms  do not improve or worsen or seek emergency care     xxxxxxxxxxxxxxxxxxxxxxx OV 09/16/2019  Subjective:  Patient ID: Bridget Mcdonald, female , DOB: 02-07-1945 , age 29 y.o. , MRN: [id number] , ADDRESS: [address] [place] [address]   09/16/2019 -   Chief Complaint  Patient presents with  . Follow-up    Hemoptysis     HPI Arta Stump 75 y.o. -patient follows up from her recent hemoptysis.  She tells me she had abrupt hemoptysis it was small in amount.  After we give antibiotics and she is a Designer, jewellery it is resolved.  I recommended a bronchoscopy but she tells me that there is significant schedule issues.  She does not have transport.  I want to do the bronchoscopy last week.  She has no neighbors or friends who could bring her.  She is not allowed to take a ride back on her own because of the sedation issues.  So she is here to discuss her options.  We discussed the possibility about doing a bronchoscopy in the afternoon and putting her overnight as observation given high risk and social issues.  She is willing for that.  At this point in time no hemoptysis and she feels back to baseline.  She question me about the recent chest x-ray changes and we reviewed that.    OV  02/01/2020  Subjective:  Patient ID: Bridget Mcdonald, female , DOB: 1945-12-20 , age 43 y.o. , MRN: [id number] , ADDRESS: [address] [place] [address] PCP Rankins, Bill Salinas, MD Patient Care Team: Aretta Nip, MD as PCP - General (Family Medicine) Jerline Pain, MD as PCP - Cardiology (Cardiology)  This Provider for this visit: Treatment Team:  Attending Provider: Brand Males, MD    02/01/2020 -   Chief Complaint  Patient presents with  . Follow-up    Coughing and wheezing more at night, SOB with exertion   #Smoker #History of hemoptysis -normal bronchoscopy September 2021 #Emphysema on CT chest last PFT 2014 #Lung nodules #Pulm infiltrates  HPI Kristene Liberati 75 y.o. -returns for follow-up.  In September 2021 she underwent bronchoscopy.  Airways were normal.  Since then the hemoptysis resolved but she tells me at this visit that since then at nighttime she is having cough with wheezing as when she lies down but does not wake her up in the middle of the night.  Her RSI Score is listed below and is significantly high suggesting irritable larynx syndrome.  She also gets cough when she chokes on some food or when she eats some food.  But there is no clear-cut aspiration. COPD CAT score itself is 22 which is stable and baseline.  She tried to take Spiriva and this was both expensive and also cause paradoxical cough.   She had a CT scan in July 2021 showed improvement or resolution of the pulm infiltrates but she has 2 new pulmonary nodules 4 mm on the right side.    CAT Score 02/01/2020 09/09/2019  Total CAT Score 22 21      Dr Lorenza Cambridge Reflux Symptom Index (> 13-15 suggestive of LPR cough) 0 -> 5  =  none ->severe problem  Hoarseness of problem with voice 0  Clearing  Of Throat 2  Excess throat mucus or feeling of post nasal drip 3  Difficulty swallowing food, liquid or tablets 0  Cough after eating or lying down 3  Breathing difficulties or  choking episodes 3  Troublesome or annoying  cough 5  Sensation of something sticking in throat or lump in throat 4  Heartburn, chest pain, indigestion, or stomach acid coming up 1  TOTAL 21     CT Chest data  No results found.    PFT  No flowsheet data found.   No flowsheet data found.    IMPRESSION: 1. Further improvement in previously described post infectious/inflammatory interstitial thickening, consistent with scarring. 2. New right-sided pulmonary nodules x2, 3 maximally 4 mm. Non-contrast chest CT can be considered in 12 months, given risk factors for primary bronchogenic carcinoma. This recommendation follows the consensus statement: Guidelines for Management of Incidental Pulmonary Nodules Detected on CT Images: From the Fleischner Society 2017; Radiology 2017; 284:228-243. 3. Aortic atherosclerosis (ICD10-I70.0), coronary artery atherosclerosis and emphysema (ICD10-J43.9). 4. Incompletely imaged upper pole left renal lesion, decreased in size and present back to 2017, presumably a complex cyst.   Electronically Signed   By: Abigail Miyamoto M.D.   On: 08/09/2019 20:13    has a past medical history of Acid reflux, Allergy history unknown, Asthma, mild intermittent, Barrett's esophagus, Cancer (HCC), Chronic headaches, Cyst of left kidney, Hematuria, High cholesterol, History of cardiac arrhythmia, History of colon polyps, Hyperlipidemia, Hypertension, Hypothyroidism, Hypotonia, IBS (irritable bowel syndrome), Incontinence, feces, Migraine headache, Osteopenia, Palpitations (12/01/2012), PVC's (premature ventricular contractions), Vertigo, Vestibular neuronitis, and Vocal cord polyps.   reports that she has been smoking cigarettes. She has a 50.00 pack-year smoking history. She has never used smokeless tobacco.  Past Surgical History:  Procedure Laterality Date  . APPENDECTOMY  1964  . BREAST LUMPECTOMY Left 2005  . COLONOSCOPY  04/2009  . OOPHORECTOMY   1968?  Marland Kitchen SKIN SURGERY  08/2012  . TONSILECTOMY, ADENOIDECTOMY, BILATERAL MYRINGOTOMY AND TUBES  1961  . TUBAL LIGATION    . VIDEO BRONCHOSCOPY N/A 10/07/2019   Procedure: VIDEO BRONCHOSCOPY WITHOUT FLUORO;  Surgeon: Brand Males, MD;  Location: [facility];  Service: Cardiopulmonary;  Laterality: N/A;    Allergies  Allergen Reactions  . Demerol [Meperidine]     hallucinations  . Epinephrine     Super hyper  . Gabapentin Diarrhea  . Quinine Derivatives     Temporary loss of hearing    Immunization History  Administered Date(s) Administered  . Fluad Quad(high Dose 65+) 10/15/2018  . Influenza Split 10/15/2010, 11/09/2015, 11/11/2016, 11/18/2017  . Influenza, High Dose Seasonal PF 12/26/2014, 10/28/2018, 11/17/2019  . Moderna Sars-Covid-2 Vaccination 04/08/2019, 05/04/2019  . PFIZER(Purple Top)SARS-COV-2 Vaccination 04/08/2019, 05/04/2019  . Pneumococcal Conjugate-13 08/30/2013, 08/14/2017  . Pneumococcal Polysaccharide-23 01/14/2009, 08/25/2012  . Tdap 07/05/2009    Family History  Problem Relation Age of Onset  . Emphysema Mother   . Cancer Father        prostate  . Heart disease Paternal Grandfather   . Cancer Brother        prostate  . Bladder Cancer Other      Current Outpatient Medications:  .  albuterol (VENTOLIN HFA) 108 (90 Base) MCG/ACT inhaler, Inhale 2 puffs into the lungs every 6 (six) hours as needed for wheezing or shortness of breath. DX; J43.9, Disp: 8 g, Rfl: 2 .  alendronate (FOSAMAX) 70 MG tablet, Take 70 mg by mouth every Saturday. Take with a full glass of water on an empty stomach., Disp: , Rfl:  .  Ascorbic Acid (VITAMIN C) 500 MG CHEW, Chew 500-1,000 mg by mouth daily., Disp: , Rfl:  .  budesonide-formoterol (SYMBICORT) 80-4.5 MCG/ACT inhaler, Inhale 2 puffs into the lungs in the morning and  at bedtime., Disp: 1 each, Rfl: 12 .  Calcium Carb-Cholecalciferol (CALCIUM 1000 + D) 1000-800 MG-UNIT TABS, Take 1 tablet by mouth daily., Disp: , Rfl:   .  diltiazem (CARDIZEM CD) 240 MG 24 hr capsule, Take 1 capsule (240 mg total) by mouth daily., Disp: 90 capsule, Rfl: 3 .  diltiazem (CARDIZEM) 60 MG tablet, Take 1 tablet (60 mg total) by mouth as needed., Disp: 30 tablet, Rfl: 1 .  hyoscyamine (ANASPAZ) 0.125 MG TBDP disintergrating tablet, Place 0.125 mg under the tongue every 4 (four) hours as needed (GI spasms). , Disp: , Rfl:  .  levothyroxine (SYNTHROID) 150 MCG tablet, Take 150 mcg by mouth daily before breakfast., Disp: , Rfl:  .  meclizine (ANTIVERT) 25 MG tablet, Take 25 mg by mouth 3 (three) times daily as needed for dizziness., Disp: , Rfl:  .  Multiple Vitamin (MULTIVITAMIN) tablet, Take 1 tablet by mouth daily. , Disp: , Rfl:  .  potassium chloride (KLOR-CON) 10 MEQ tablet, Take 1 tablet (10 mEq total) by mouth daily., Disp: 90 tablet, Rfl: 3 .  rosuvastatin (CRESTOR) 20 MG tablet, Take 1 tablet (20 mg total) by mouth daily., Disp: 90 tablet, Rfl: 3      Objective:   Vitals:   02/01/20 1606  BP: 118/74  Pulse: 84  Temp: 97.6 F (36.4 C)  TempSrc: Oral  SpO2: 94%  Weight: 155 lb 6.4 oz (70.5 kg)  Height: 5' 6.5" (1.689 m)    Estimated body mass index is 24.71 kg/m as calculated from the following:   Height as of this encounter: 5' 6.5" (1.689 m).   Weight as of this encounter: 155 lb 6.4 oz (70.5 kg).  _0 @  Filed Weights   02/01/20 1606  Weight: 155 lb 6.4 oz (70.5 kg)     Physical Exam  General: No distress. Looks well Neuro: Alert and Oriented x 3. GCS 15. Speech normal Psych: Pleasant Resp:  Barrel Chest - no.  Wheeze - no, Crackles - no, No overt respiratory distress CVS: Normal heart sounds. Murmurs - no Ext: Stigmata of Connective Tissue Disease - no HEENT: Normal upper airway. PEERL +. No post nasal drip        Assessment:       ICD-10-CM   1. Chronic obstructive pulmonary disease, unspecified COPD type ([place])  J44.9 CT Chest Wo Contrast    Pulmonary function test   Some of  her cough component is because of emphysema/COPD.  On the other hand some of the sounds like irritable larynx syndrome/acid reflux.  It is hard to sort this out.  I explained to her that it is best to approach 1 problem and see how much residual symptoms are there.  However she has had problems affording inhalers of finding it to have side effects.  Encouraged her to try different type of inhaler.  We will start by prescribing Symbicort and see what happens.  She will report back.  If Symbicort is too expensive then we will try to give her sample inhalers of BREZTRI.  We will also take the opportunity to get PFTs.    Plan:     Patient Instructions  Pulmonary infiltrates -  improved on CT Jan 04, 2019 and July 2201 Pulmonary nodules - 2 new nodules 19m right side July 2021  Plan  -repeat CT chest without contrast in Aug 2022  History Smoking  - please work on quitting  - we can discuss strategies next visit  Pulmonary emphysema, unspecified  emphysema type ([place]) - as seen on CT  -  2014 PFt with moderate reduction in diffusion Chronic Cough  - cough and wheezing could be due to copd or something else  too bad spiriva caused cough -  Plan -start  symbicort 80/4.5 2 puff bid - price it out - let us see if this helps cough - do full PFT in 6-8 weeks or so   Followup  - 6-8 weeks to see resposne to inahler  but come after full PFT     SIGNATURE    Dr. Brand Males, M.D., F.C.C.P,  Pulmonary and Critical Care Medicine Staff Physician, [place] Director - Interstitial Lung Disease  Program  [facility] [place], [place], [address]  Pager: [phone], If no answer or between  15:00h - 7:00h: call [phone] Telephone: [phone]  5:16 PM 02/01/2020

## 2020-02-01 NOTE — Patient Instructions (Addendum)
Pulmonary infiltrates -  improved on CT Jan 04, 2019 and July 2201 Pulmonary nodules - 2 new nodules 53mm right side July 2021  Plan  -repeat CT chest without contrast in Aug 2022  History Smoking  - please work on quitting  - we can discuss strategies next visit  Pulmonary emphysema, unspecified emphysema type ([place]) - as seen on CT  -  2014 PFt with moderate reduction in diffusion Chronic Cough  - cough and wheezing could be due to copd or something else  too bad spiriva caused cough -  Plan -start  symbicort 80/4.5 2 puff bid - price it out - let us see if this helps cough - do full PFT in 6-8 weeks or so   Followup  - 6-8 weeks to see resposne to inahler  but come after full PFT

## 2020-02-15 ENCOUNTER — Other Ambulatory Visit: Payer: Medicare Other

## 2020-02-15 ENCOUNTER — Ambulatory Visit: Payer: Medicare Other

## 2020-02-18 ENCOUNTER — Ambulatory Visit: Payer: Medicare Other | Admitting: Internal Medicine

## 2020-04-10 ENCOUNTER — Other Ambulatory Visit (HOSPITAL_COMMUNITY)
Admission: RE | Admit: 2020-04-10 | Discharge: 2020-04-10 | Disposition: A | Discharge status: Home / Self Care | Payer: Medicare Other | Source: Ambulatory Visit | Attending: Internal Medicine | Admitting: Internal Medicine

## 2020-04-10 DIAGNOSIS — Z01812 Encounter for preprocedural laboratory examination: Secondary | ICD-10-CM | POA: Insufficient documentation

## 2020-04-10 DIAGNOSIS — Z20822 Contact with and (suspected) exposure to covid-19: Secondary | ICD-10-CM | POA: Diagnosis not present

## 2020-04-10 LAB — SARS CORONAVIRUS 2 (TAT 6-24 HRS): SARS Coronavirus 2: NEGATIVE

## 2020-04-13 ENCOUNTER — Ambulatory Visit: Payer: Medicare Other | Admitting: Internal Medicine

## 2020-05-03 ENCOUNTER — Ambulatory Visit
Admission: RE | Admit: 2020-05-03 | Discharge: 2020-05-03 | Disposition: A | Discharge status: Home / Self Care | Payer: Medicare Other | Source: Ambulatory Visit | Attending: Family Medicine | Admitting: Family Medicine

## 2020-05-03 ENCOUNTER — Other Ambulatory Visit: Payer: Medicare Other

## 2020-05-03 ENCOUNTER — Other Ambulatory Visit: Payer: Self-pay

## 2020-05-03 DIAGNOSIS — Z1231 Encounter for screening mammogram for malignant neoplasm of breast: Secondary | ICD-10-CM

## 2020-05-23 DIAGNOSIS — E039 Hypothyroidism, unspecified: Secondary | ICD-10-CM | POA: Diagnosis not present

## 2020-05-23 DIAGNOSIS — I10 Essential (primary) hypertension: Secondary | ICD-10-CM | POA: Diagnosis not present

## 2020-05-29 ENCOUNTER — Other Ambulatory Visit (HOSPITAL_COMMUNITY)
Admission: RE | Admit: 2020-05-29 | Discharge: 2020-05-29 | Disposition: A | Discharge status: Home / Self Care | Payer: Medicare Other | Source: Ambulatory Visit | Attending: Internal Medicine | Admitting: Internal Medicine

## 2020-05-29 DIAGNOSIS — Z01812 Encounter for preprocedural laboratory examination: Secondary | ICD-10-CM | POA: Diagnosis not present

## 2020-05-29 DIAGNOSIS — Z20822 Contact with and (suspected) exposure to covid-19: Secondary | ICD-10-CM | POA: Diagnosis not present

## 2020-05-29 LAB — SARS CORONAVIRUS 2 (TAT 6-24 HRS): SARS Coronavirus 2: NEGATIVE

## 2020-05-31 ENCOUNTER — Ambulatory Visit: Payer: Medicare Other | Admitting: Internal Medicine

## 2020-05-31 ENCOUNTER — Other Ambulatory Visit: Payer: Self-pay

## 2020-05-31 ENCOUNTER — Ambulatory Visit (INDEPENDENT_AMBULATORY_CARE_PROVIDER_SITE_OTHER): Payer: Medicare Other | Admitting: Internal Medicine

## 2020-05-31 ENCOUNTER — Encounter: Payer: Self-pay | Admitting: Internal Medicine

## 2020-05-31 VITALS — BP 122/60 | HR 82 | Temp 97.7°F | Ht 66.0 in | Wt 153.0 lb

## 2020-05-31 DIAGNOSIS — Z87891 Personal history of nicotine dependence: Secondary | ICD-10-CM

## 2020-05-31 DIAGNOSIS — R042 Hemoptysis: Secondary | ICD-10-CM | POA: Diagnosis not present

## 2020-05-31 DIAGNOSIS — R059 Cough, unspecified: Secondary | ICD-10-CM

## 2020-05-31 DIAGNOSIS — J449 Chronic obstructive pulmonary disease, unspecified: Secondary | ICD-10-CM

## 2020-05-31 DIAGNOSIS — I10 Essential (primary) hypertension: Secondary | ICD-10-CM

## 2020-05-31 DIAGNOSIS — R918 Other nonspecific abnormal finding of lung field: Secondary | ICD-10-CM | POA: Diagnosis not present

## 2020-05-31 DIAGNOSIS — J439 Emphysema, unspecified: Secondary | ICD-10-CM

## 2020-05-31 MED ORDER — CEPHALEXIN 500 MG PO CAPS: 500.0000 mg | ORAL_CAPSULE | Freq: Three times a day (TID) | ORAL | 0 refills | Status: DC

## 2020-05-31 NOTE — Patient Instructions (Addendum)
Pulmonary infiltrates -  improved on CT Jan 04, 2019 and July 2021 CT Pulmonary nodules - 2 new nodules 5mm right side July 2021 CT History of ongoing Smoking - unable to quit completely  Pulmonary emphysema, unspecified emphysema type ([place]) - as seen on CT  -  2014 PFt with moderate reduction in diffusion  - on symbicort  Recurrent hemoptysis - no cause identified so far; again May 2022   Plan -need to rule out Pulmonary AVM  - do bubble study echo  - do HRCT supine and prone - specifically to look for Pulmary aVM  - take empiric cephalexin 500mg  tid x 5 days  - continue prior symbicort  Followup  -telephone visit or face to face next 2 weeks wiuth Dr Chase Caller or APP to discuss results

## 2020-05-31 NOTE — Progress Notes (Signed)
Patient not able to perform.

## 2020-05-31 NOTE — Patient Instructions (Signed)
PFT not performed.

## 2020-05-31 NOTE — Progress Notes (Signed)
HPI  IOV 11/03/2018  Chief Complaint  Patient presents with  . Consult     Bridget Mcdonald is 75 y.o. 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  She is a heavy smoker.  She has multiple complaints with the main reason for referral and the main complaint currently is 1 of hemoptysis ongoing for couple of months.  It is new in onset.  She believes she might have something autoimmune going on.  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  In addition for the last 2 or 3 years she has had dry skin in her lower extremities and the skin is flaky.  She states that the etiologic diagnosis for both these conditions is idiopathic.  Then approximately 2 at the end of August she started developing hemoptysis.  Since then it has persisted although the active frank blood is no longer present since October 04, 2018 or so.  She states the first episode of hemoptysis started August 27, 2018 and went through the whole weekend.  Then around September 06, 2018 she was in bed with symptoms ranging from coughing of blood clots fatigue, freezing cold, nausea and uncontrollable diarrhea and a sense that her atrial fibrillation was not under control.  The following week she did have right neck and ear pain with pressure pain in the left lower lateral lung in the lower part.  Fatigue also started.  She had a CT scan of the chest on September 9 that shows right upper lobe posterior segment groundglass opacities consistent with local alveolar hemorrhage in my personal visualization and opinion.  However she points to the left lower lobe infrascapular area as a point of discomfort.  In this area there is a small subpleural nodularity.  Then from mid September through October 10, 2018 she took Avelox she continued to have fatigue  and anxiety.  Around this timeframe hemoptysis resolved.  She says this is mainly because she is controlling her cough and clearing her throat.  But the chest pressure has persisted.  She feels the neuropathy has worsened.  She feels the same amount of edema as well.  She is also feels she is constantly hungry and rapidly gaining weight  She continues to smoke heavily.  Review of labs show nonbacterial microscopic hematuria in 2017.  She does not recollect taking antibiotics for this.  She had an echocardiogram in 2017 that was normal but she says she has a history of mitral valve prolapse.  She also has shortness of breath with exertion relieved by rest although walking desaturation test showed she did not desaturate.  She walked 185 feet x 3 laps in office with a resting pulse ox of 99% and the resting heart rate of 96/min.  When she finished she was pulse ox 100% and a heart rate of 117/min.  IMPRESSION: CT chest 1. Interlobular septal line thickening and ground-glass opacities in the medial right upper lobe. This is nonspecific, but can see be seen in pulmonary hemorrhage syndromes, pneumonia, and pulmonary alveolar proteinosis. 2. Multiple bilateral solid and ground-glass pulmonary nodules. Non-contrast chest CT at 3-6 months is recommended. If the nodules are stable at time of repeat CT, then future CT at 18-24 months (from today's scan) is considered optional for low-risk patients,  but is recommended for high-risk patients. This recommendation follows the consensus statement: Guidelines for Management of Incidental Pulmonary Nodules Detected on CT Images: From the Fleischner Society 2017; Radiology 2017; 284:228-243.  Aortic Atherosclerosis (ICD10-I70.0).   Electronically Signed   By: Zerita Boers M.D.   On: 09/24/2018 09:40    OV 01/11/2019  Subjective:  Patient ID: Bridget Mcdonald, female , DOB: 19-Aug-1945 , age 52 y.o. , MRN: [id number] , ADDRESS: [place] [address]   01/11/2019 -   Chief Complaint  Patient presents with  . Follow-up    Pt states she has been doing okay since last visit. Pt said she has been coughing up green-yellow phlegm which has been going on x4 days.     HPI Bridget Mcdonald 75 y.o. -presents for follow-up of review of her results.  Since her last visit in October she has not had any more hemoptysis.  She had a CT scan of the chest that I personally visualized.  The pulmonary infiltrate is resolved.  The nodules have improved.  She tells me that her smoking has relapsed.  There is a history of microscopic hematuria but her repeat UA shows only 3-6 RBCs per high-power field.  At this point in time she says that she has been isolating pretty well.  There is no clusters for COVID-19.  However for the last day or so she has had cough increase with yellow-green phlegm.  She thinks is because her smoking has relapsed.  She thinks if she quit smoking this will improve.  She is not keen on antibiotics of prednisone because of multiple different allergies.  We reviewed her pulmonary function test from 2014 and this shows isolated reduction diffusion capacity.  Her current CT chest in my personal visualization shows some emphysema although this is not reported in the official report.  She admits to anxiety particularly when she quit smoking.   IMPRESSION:  Lungs/Pleura: Resolution of the region of ground-glass opacity and pleural nodularity in the RIGHT upper lobe. There is minimal residual pleuroparenchymal nodularity measuring 16 mm x 9 mm decreased from 22 mm x 12 mm on prior (image 24/8).  Smaller scattered pulmonary nodules also are improved.  For example RIGHT lower lobe nodule measuring 5 mm (image 90/8) compares to 7 mm.  Several adjacent LEFT lower lobe nodules are also decreased. For example 2 mm nodule (image 80/8) decreased from 7 mm.  LEFT upper lobe nodule previously on image 36/3 has  resolved completely.  1. Near complete resolution of RIGHT upper lobe ground-glass opacity and pleural nodularity. Interval decrease in size of bilateral pulmonary nodules. Findings consistent with resolving infectious or inflammatory process. 2. Aortic Atherosclerosis (ICD10-I70.0).   Electronically Signed   By: Suzy Bouchard M.D.   On: 01/04/2019 08:13   Results for MADYSEN, FAIRCLOTH (MRN [id number]) as of 01/11/2019 09:23  Ref. Range 11/03/2018 12:24  ANA Titer 1 Unknown Negative  ANCA Proteinase 3 Latest Ref Range: 0.0 - 3.5 U/mL <3.5  Angiotensin-Converting Enzyme Latest Ref Range: 9 - 67 U/L 20  Cyclic Citrullin Peptide Ab Latest Units: UNITS <16  dsDNA Ab Latest Ref Range: 0 - 9 IU/mL <1  ENA RNP Ab Latest Ref Range: 0.0 - 0.9 AI <0.2  ENA SSA (RO) Ab Latest Ref Range: 0.0 - 0.9 AI <0.2  ENA SSB (LA) Ab Latest Ref Range: 0.0 - 0.9 AI <0.2  Myeloperoxidase Abs Latest Units: AI <1.0  Serine Protease 3 Latest Units: AI <1.0  RA  Latex Turbid. Latest Ref Range: <14 IU/mL 24 (H)  ENA SM Ab Ser-aCnc Latest Ref Range: 0.0 - 0.9 AI <0.2  Scleroderma (Scl-70) (ENA) Antibody, IgG Latest Ref Range: 0.0 - 0.9 AI <0.2    Results for MYKAEL, TROTT (MRN [id number]) as of 01/11/2019 09:23  Ref. Range 11/03/2018 12:24  RBC / HPF Latest Ref Range: 0-2/hpf  3-6/hpf (A)  Results for MYKENZIE, EBANKS (MRN [id number]) as of 01/11/2019 09:23  Ref. Range 04/25/2015 15:49  RBC / HPF Latest Ref Range: 0 - 5 RBC/hpf TOO NUMEROUS TO COUNT   Sep 09, 2019 tele visit  48-year-old female active smoker seen for pulmonary consult October 2020 for hemoptysis Retired Arts development officer, from [place].  Today's televisit is for an acute work in for hemoptysis.  Patient has had several episodes of hemoptysis over the last year.  This initially started about 1 year ago.  She was seen for pulmonary consult in October 2020.  CT chest showed groundglass opacities in the middle right upper lobe.   And multiple solid and groundglass pulmonary nodules scattered.  She had autoimmune testing that was essentially unrevealing. Follow-up CT chest December 2020 showed near complete resolution of right upper lobe groundglass opacity.  And decreased size of bilateral pulmonary nodules. Recent CT chest August 09, 2019 showed further improvement in the infectious postinflammatory interstitial thickening.  And some new right-sided pulmonary nodules maximum 4 mm. Patient complains that approximately 1 to 2 days ago she started coughing up some blood again. She has some increased cough and congestion.  Has increased fatigue.  Has some increased chest tightness.  Patient was recommended to begin Keflex 500 mg 3 times daily for 5 days.  And to be set up for a bronchoscopy.  Patient says she continues to have some increased congestion has not picked up her antibiotics.  We went over recommendations for her bronchoscopy with patient education given.  Patient says unfortunately she has no transportation as she has no one to take her or pick her up from the procedure.  Given patient's recurrent hemoptysis increased shortness of breath and inability for procedure.  I recommend patient seek emergency room care as inability to evaluate properly via televisit.  Patient declines says she has not going to the emergency room. Patient does say that she will come in and get labs and a chest x-ray.  And wants [place] to try to find her medical transportation for procedure.    Observations/Objective: CT chest 07/2019 -Further improvement in previously described post infectious/inflammatory interstitial thickening, consistent with scarring. 2. New right-sided pulmonary nodules x2, 3 maximally 4 mm. Non-contrast chest CT can be considered in 12 months, given risk factors for primary bronchogenic carcinoma.   Assessment and Plan: Recurrent hemoptysis and active smoker.  Questionable etiology.  Unable to adequately assess patient via  televisit.  Have recommended emergency room/urgent care evaluation patient declines.  Patient says she will come to the office for chest x-ray and labs if she can get here before our office closes. Per Dr. Chase Caller with request for bronchoscopy.  Lab work will be done including CBC c-Met and a PT/INR. We will contact community resources see if medical transportation is an option for this patient.  Plan  Patient Instructions  Take antibiotics as directed Come to the office for a chest x-ray and labs We will call you with information regarding bronchoscopy Follow-up in the office with Dr. Chase Caller in 1 week and as needed Please contact office for sooner follow up if  symptoms do not improve or worsen or seek emergency care     xxxxxxxxxxxxxxxxxxxxxxx OV 09/16/2019  Subjective:  Patient ID: Bridget Mcdonald, female , DOB: April 07, 1945 , age 47 y.o. , MRN: [id number] , ADDRESS: [place] [address]   09/16/2019 -   Chief Complaint  Patient presents with  . Follow-up    Hemoptysis     HPI Emalee Knies 75 y.o. -patient follows up from her recent hemoptysis.  She tells me she had abrupt hemoptysis it was small in amount.  After we give antibiotics and she is a Designer, jewellery it is resolved.  I recommended a bronchoscopy but she tells me that there is significant schedule issues.  She does not have transport.  I want to do the bronchoscopy last week.  She has no neighbors or friends who could bring her.  She is not allowed to take a ride back on her own because of the sedation issues.  So she is here to discuss her options.  We discussed the possibility about doing a bronchoscopy in the afternoon and putting her overnight as observation given high risk and social issues.  She is willing for that.  At this point in time no hemoptysis and she feels back to baseline.  She question me about the recent chest x-ray changes and we reviewed that.    OV  02/01/2020  Subjective:  Patient ID: Bridget Mcdonald, female , DOB: 10-18-1945 , age 53 y.o. , MRN: [id number] , ADDRESS: [address] [place] [id number] PCP Rankins, Bill Salinas, MD Patient Care Team: Aretta Nip, MD as PCP - General (Family Medicine) Jerline Pain, MD as PCP - Cardiology (Cardiology)  This Provider for this visit: Treatment Team:  Attending Provider: Brand Males, MD    02/01/2020 -   Chief Complaint  Patient presents with  . Follow-up    Coughing and wheezing more at night, SOB with exertion   #Smoker #History of hemoptysis -normal bronchoscopy September 2021 #Emphysema on CT chest last PFT 2014 #Lung nodules #Pulm infiltrates  HPI Cachet Mccutchen 75 y.o. -returns for follow-up.  In September 2021 she underwent bronchoscopy.  Airways were normal.  Since then the hemoptysis resolved but she tells me at this visit that since then at nighttime she is having cough with wheezing as when she lies down but does not wake her up in the middle of the night.  Her RSI Score is listed below and is significantly high suggesting irritable larynx syndrome.  She also gets cough when she chokes on some food or when she eats some food.  But there is no clear-cut aspiration. COPD CAT score itself is 22 which is stable and baseline.  She tried to take Spiriva and this was both expensive and also cause paradoxical cough. She had a CT scan in July 2021 showed improvement or resolution of the pulm infiltrates but she has 2 new pulmonary nodules 4 mm on the right side.    CAT Score 02/01/2020 09/09/2019  Total CAT Score 22 21      CT Chest data  No results found.    PFT  No flowsheet data found.   No flowsheet data found.    IMPRESSION: 1. Further improvement in previously described post infectious/inflammatory interstitial thickening, consistent with scarring. 2. New right-sided pulmonary nodules x2, 3 maximally 4 mm. Non-contrast chest CT  can be considered in 12 months, given risk factors for primary bronchogenic carcinoma. This recommendation follows the consensus statement: Guidelines  for Management of Incidental Pulmonary Nodules Detected on CT Images: From the Fleischner Society 2017; Radiology 2017; 284:228-243. 3. Aortic atherosclerosis (ICD10-I70.0), coronary artery atherosclerosis and emphysema (ICD10-J43.9). 4. Incompletely imaged upper pole left renal lesion, decreased in size and present back to 2017, presumably a complex cyst.   Electronically Signed   By: Abigail Miyamoto M.D.   On: 08/09/2019 20:13   OV 05/31/2020  Subjective:  Patient ID: Bridget Mcdonald, female , DOB: 13-Apr-1945 , age 32 y.o. , MRN: [id number] , ADDRESS: [place] [address] PCP Rankins, Bill Salinas, MD Patient Care Team: Aretta Nip, MD as PCP - General (Family Medicine) Jerline Pain, MD as PCP - Cardiology (Cardiology)  This Provider for this visit: Treatment Team:  Attending Provider: Brand Males, MD    05/31/2020 -   Chief Complaint  Patient presents with  . Follow-up    Coughing up blood since Friday. Today she says it is less blood mostly with mucus. Having cramps in rib area. Using Symbicort once a day instead of twice. But has not taken any inhalers since Friday when bleeding started. Feeling weak   #Smoker #History of hemoptysis -normal bronchoscopy September 2021 #Emphysema on CT chest last PFT 2014 #Lung nodules #Pulm infiltrates  HPI Nazaria Riesen 75 y.o. -presents for follow-up.  At this visit she was supposed to have pulmonary function test but she could not perform because of technical issues with performance skills.  She tells me she was doing okay except she would have occasional mild amounts of dark hemoptysis.  She says this always happens when she rolls over to the right side.  Likewise: On Friday, 05/26/2020 she turned over in bed to the right side and then all of a  sudden she could feel bubbles in her chest and then she tried to get to the restroom and by the time she was coughing up a lot of blood.  She says it was significant.  Since then the color has improved and the volume has improved.  She states please come unprovoked but it appears the pattern appears to be when she is turning to her right side in the bed.  The only exception to this was in 2020 when she was on the phone otherwise it always happens when she turns to the right side.  There is no fever or chills.  No COVID symptoms.  No worsening cough no wheezing no other sputum.  She does not feel she is safe she does not feel for COPD exacerbation.  He is open to taking antibiotics.  She is frustrated that we have not found an etiology so far.  She still is smoking and is trying to quit.   Also complains of cramps in her lower chest bilaterally.   Dr Lorenza Cambridge Reflux Symptom Index (> 13-15 suggestive of LPR cough) Jan 2022  Hoarseness of problem with voice 0  Clearing  Of Throat 2  Excess throat mucus or feeling of post nasal drip 3  Difficulty swallowing food, liquid or tablets 0  Cough after eating or lying down 3  Breathing difficulties or choking episodes 3  Troublesome or annoying cough 5  Sensation of something sticking in throat or lump in throat 4  Heartburn, chest pain, indigestion, or stomach acid coming up 1  TOTAL 21      PFT  No flowsheet data found.     has a past medical history of Acid reflux, Allergy history unknown, Asthma, mild intermittent, Barrett's esophagus,  [facility]), Chronic headaches, Cyst of left kidney, Hematuria, High cholesterol, History of cardiac arrhythmia, History of colon polyps, Hyperlipidemia, Hypertension, Hypothyroidism, Hypotonia, IBS (irritable bowel syndrome), Incontinence, feces, Migraine headache, Osteopenia, Palpitations (12/01/2012), PVC's (premature ventricular contractions), Vertigo, Vestibular neuronitis, and Vocal cord polyps.   reports  that she has been smoking cigarettes. She has a 50.00 pack-year smoking history. She has never used smokeless tobacco.  Past Surgical History:  Procedure Laterality Date  . APPENDECTOMY  1964  . BREAST LUMPECTOMY Left 2005  . COLONOSCOPY  04/2009  . OOPHORECTOMY  1968?  Marland Kitchen SKIN SURGERY  08/2012  . TONSILECTOMY, ADENOIDECTOMY, BILATERAL MYRINGOTOMY AND TUBES  1961  . TUBAL LIGATION    . VIDEO BRONCHOSCOPY N/A 10/07/2019   Procedure: VIDEO BRONCHOSCOPY WITHOUT FLUORO;  Surgeon: Brand Males, MD;  Location: [facility];  Service: Cardiopulmonary;  Laterality: N/A;    Allergies  Allergen Reactions  . Demerol [Meperidine]     hallucinations  . Epinephrine     Super hyper  . Gabapentin Diarrhea  . Quinine Derivatives     Temporary loss of hearing    Immunization History  Administered Date(s) Administered  . Fluad Quad(high Dose 65+) 10/15/2018  . Influenza Split 10/15/2010, 11/09/2015, 11/11/2016, 11/18/2017  . Influenza, High Dose Seasonal PF 12/26/2014, 10/28/2018, 11/17/2019  . PFIZER(Purple Top)SARS-COV-2 Vaccination 04/08/2019, 05/04/2019  . Pneumococcal Conjugate-13 08/30/2013, 08/14/2017  . Pneumococcal Polysaccharide-23 01/14/2009, 08/25/2012  . Tdap 07/05/2009    Family History  Problem Relation Age of Onset  . Emphysema Mother   . Cancer Father        prostate  . Heart disease Paternal Grandfather   . Cancer Brother        prostate  . Bladder Cancer Other      Current Outpatient Medications:  .  albuterol (VENTOLIN HFA) 108 (90 Base) MCG/ACT inhaler, Inhale 2 puffs into the lungs every 6 (six) hours as needed for wheezing or shortness of breath. DX; J43.9, Disp: 8 g, Rfl: 2 .  alendronate (FOSAMAX) 70 MG tablet, Take 70 mg by mouth every Saturday. Take with a full glass of water on an empty stomach., Disp: , Rfl:  .  Ascorbic Acid (VITAMIN C) 500 MG CHEW, Chew 500-1,000 mg by mouth daily., Disp: , Rfl:  .  budesonide-formoterol (SYMBICORT) 80-4.5 MCG/ACT  inhaler, Inhale 2 puffs into the lungs in the morning and at bedtime. (Patient taking differently: Inhale 2 puffs into the lungs in the morning and at bedtime. Using once a day), Disp: 1 each, Rfl: 12 .  Calcium Carb-Cholecalciferol (CALCIUM 1000 + D) 1000-800 MG-UNIT TABS, Take 1 tablet by mouth daily. 750 mg, Disp: , Rfl:  .  diltiazem (CARDIZEM CD) 240 MG 24 hr capsule, Take 1 capsule (240 mg total) by mouth daily., Disp: 90 capsule, Rfl: 3 .  diltiazem (CARDIZEM) 60 MG tablet, Take 1 tablet (60 mg total) by mouth as needed., Disp: 30 tablet, Rfl: 1 .  hyoscyamine (ANASPAZ) 0.125 MG TBDP disintergrating tablet, Place 0.125 mg under the tongue every 4 (four) hours as needed (GI spasms). , Disp: , Rfl:  .  levothyroxine (SYNTHROID) 150 MCG tablet, Take 150 mcg by mouth daily before breakfast., Disp: , Rfl:  .  meclizine (ANTIVERT) 25 MG tablet, Take 25 mg by mouth 3 (three) times daily as needed for dizziness., Disp: , Rfl:  .  Multiple Vitamin (MULTIVITAMIN) tablet, Take 1 tablet by mouth daily. , Disp: , Rfl:  .  potassium chloride (KLOR-CON) 10 MEQ tablet, Take 1  tablet (10 mEq total) by mouth daily., Disp: 90 tablet, Rfl: 3 .  rosuvastatin (CRESTOR) 20 MG tablet, Take 1 tablet (20 mg total) by mouth daily., Disp: 90 tablet, Rfl: 3      Objective:   Vitals:   05/31/20 1602 05/31/20 1609  BP: 122/60   Pulse:  82  Temp: 97.7 F (36.5 C)   SpO2:  94%  Weight: 153 lb (69.4 kg)   Height: 5' 6"  (1.676 m)     Estimated body mass index is 24.69 kg/m as calculated from the following:   Height as of this encounter: 5' 6"  (1.676 m).   Weight as of this encounter: 153 lb (69.4 kg).  @WEIGHTCHANGE @  Filed Weights   05/31/20 1602  Weight: 153 lb (69.4 kg)     Physical Exam  General: No distress. Looks well Neuro: Alert and Oriented x 3. GCS 15. Speech normal Psych: Pleasant Resp:  Barrel Chest - no.  Wheeze - no, Crackles - no, No overt respiratory distress CVS: Normal heart sounds.  Murmurs - no Ext: Stigmata of Connective Tissue Disease - no HEENT: Normal upper airway. PEERL +. No post nasal drip        Assessment:       ICD-10-CM   1. Hemoptysis  R04.2   2. History of smoking  Z87.891   3. Pulmonary nodules  R91.8   4. Pulmonary emphysema, unspecified emphysema type ([place])  J43.9    Really odd story of unprovoked hemoptysis without any other symptoms.  Prior work-up has been negative.  I wonder if she has pulmonary arteriovenous motivation particularly given the fact it is positional and comes unprovoked in large amounts and then settles.  Do not specifically look for this.  Previous CT scan of the chest was not a high-resolution CT chest.  I explained this possibility to her.  We will give empiric antibiotics in case this is bronchitis from active smoking.  We will get an echocardiogram for shunt.  Namely a bubble study echo.  Also get high-resolution CT chest supine and prone specifically looking for AVM.  She is agreeable with this plan    Plan:     Patient Instructions  Pulmonary infiltrates -  improved on CT Jan 04, 2019 and July 2021 CT Pulmonary nodules - 2 new nodules 29m right side July 2021 CT History of ongoing Smoking - unable to quit completely  Pulmonary emphysema, unspecified [place]ysema type [place] - as seen on CT  -  2014 PFt with moderate reduction in diffusion  - on symbicort  Recurrent hemoptysis - no cause identified so far; again May 2022   Plan -need to rule out Pulmonary AVM  - do bubble study echo  - do HRCT supine and prone - specifically to look for Pulmary aVM  - take empiric cephalexin 5060mtid x 5 days  - continue prior symbicort  Followup  -telephone visit or face to face next 2 weeks wiuth Dr RaChase Callerr APP to discuss results     SIGNATURE   Insidious onset of shortness of breath.  For the last few months.  It is getting better according to the ILD questionnaire.  No episodes.  Activity is limited by knee  pain. Dr. MuBrand MalesM.D., F.C.C.P,  Pulmonary and Critical Care Medicine Staff Physician, [place] - Interstitial Lung Disease  Program  Pulmonary Fi[id number]: [phone] no answer  or between  15:00h - 7:00h: call [phone] Telephone: [phone]  5:12 PM 05/31/2020

## 2020-06-06 DIAGNOSIS — R7301 Impaired fasting glucose: Secondary | ICD-10-CM | POA: Diagnosis not present

## 2020-06-07 ENCOUNTER — Other Ambulatory Visit: Payer: Medicare Other

## 2020-06-14 ENCOUNTER — Ambulatory Visit (HOSPITAL_COMMUNITY): Admission: RE | Admit: 2020-06-14 | Payer: Medicare Other | Source: Ambulatory Visit

## 2020-06-21 ENCOUNTER — Ambulatory Visit: Payer: Medicare Other | Admitting: Adult Health

## 2020-06-21 ENCOUNTER — Ambulatory Visit (HOSPITAL_COMMUNITY)
Admission: RE | Admit: 2020-06-21 | Discharge: 2020-06-21 | Disposition: A | Discharge status: Home / Self Care | Payer: Medicare Other | Source: Ambulatory Visit | Attending: Internal Medicine | Admitting: Internal Medicine

## 2020-06-21 ENCOUNTER — Other Ambulatory Visit: Payer: Self-pay

## 2020-06-21 DIAGNOSIS — J439 Emphysema, unspecified: Secondary | ICD-10-CM | POA: Insufficient documentation

## 2020-06-21 DIAGNOSIS — R918 Other nonspecific abnormal finding of lung field: Secondary | ICD-10-CM | POA: Insufficient documentation

## 2020-06-21 DIAGNOSIS — R059 Cough, unspecified: Secondary | ICD-10-CM | POA: Diagnosis not present

## 2020-06-21 DIAGNOSIS — E785 Hyperlipidemia, unspecified: Secondary | ICD-10-CM | POA: Diagnosis not present

## 2020-06-21 DIAGNOSIS — I10 Essential (primary) hypertension: Secondary | ICD-10-CM | POA: Diagnosis not present

## 2020-06-21 DIAGNOSIS — R042 Hemoptysis: Secondary | ICD-10-CM | POA: Diagnosis not present

## 2020-06-21 DIAGNOSIS — I351 Nonrheumatic aortic (valve) insufficiency: Secondary | ICD-10-CM | POA: Insufficient documentation

## 2020-06-21 LAB — ECHOCARDIOGRAM COMPLETE BUBBLE STUDY
Area-P 1/2: 2.28 cm2
P 1/2 time: 469 msec
S' Lateral: 2.7 cm

## 2020-06-21 NOTE — Progress Notes (Signed)
Normal echo except for mild gr 1 diast dysfn. Will not be calling in wth results. Pls ensure to keep up with upcoming followup

## 2020-06-21 NOTE — Progress Notes (Signed)
  Echocardiogram 2D Echocardiogram has been performed.  Jennette Dubin 06/21/2020, 8:58 AM

## 2020-06-23 ENCOUNTER — Other Ambulatory Visit: Payer: Medicare Other

## 2020-06-29 ENCOUNTER — Ambulatory Visit
Admission: RE | Admit: 2020-06-29 | Discharge: 2020-06-29 | Disposition: A | Discharge status: Home / Self Care | Payer: Medicare Other | Source: Ambulatory Visit | Attending: Internal Medicine | Admitting: Internal Medicine

## 2020-06-29 ENCOUNTER — Ambulatory Visit: Payer: Medicare Other | Admitting: Adult Health

## 2020-06-29 DIAGNOSIS — R042 Hemoptysis: Secondary | ICD-10-CM

## 2020-06-29 DIAGNOSIS — R918 Other nonspecific abnormal finding of lung field: Secondary | ICD-10-CM | POA: Diagnosis not present

## 2020-07-04 ENCOUNTER — Encounter: Payer: Self-pay | Admitting: Adult Health

## 2020-07-04 ENCOUNTER — Ambulatory Visit (INDEPENDENT_AMBULATORY_CARE_PROVIDER_SITE_OTHER): Payer: Medicare Other | Admitting: Adult Health

## 2020-07-04 ENCOUNTER — Other Ambulatory Visit: Payer: Self-pay

## 2020-07-04 VITALS — BP 126/58 | HR 93 | Ht 66.0 in | Wt 149.0 lb

## 2020-07-04 DIAGNOSIS — J449 Chronic obstructive pulmonary disease, unspecified: Secondary | ICD-10-CM | POA: Diagnosis not present

## 2020-07-04 DIAGNOSIS — R042 Hemoptysis: Secondary | ICD-10-CM

## 2020-07-04 DIAGNOSIS — F17200 Nicotine dependence, unspecified, uncomplicated: Secondary | ICD-10-CM

## 2020-07-04 DIAGNOSIS — R918 Other nonspecific abnormal finding of lung field: Secondary | ICD-10-CM | POA: Diagnosis not present

## 2020-07-04 MED ORDER — TIOTROPIUM BROMIDE-OLODATEROL 2.5-2.5 MCG/ACT IN AERS: 2.0000 | INHALATION_SPRAY | Freq: Every day | RESPIRATORY_TRACT | 0 refills | Status: DC

## 2020-07-04 MED ORDER — TIOTROPIUM BROMIDE-OLODATEROL 2.5-2.5 MCG/ACT IN AERS
2.0000 | INHALATION_SPRAY | Freq: Every day | RESPIRATORY_TRACT | 2 refills | Status: AC
Start: 2020-07-04 — End: ?

## 2020-07-04 NOTE — Assessment & Plan Note (Signed)
Discussed smoking cessation in detail

## 2020-07-04 NOTE — Assessment & Plan Note (Signed)
Waxing and waning pulmonary nodules dating back to September 2020.  Most recent CT chest this month shows resolution of right-sided nodules.  However new scattered pulmonary nodules were noted with maximum measurement at 0.8 cm.  She will need close follow-up with CT chest in 3 months.

## 2020-07-04 NOTE — Patient Instructions (Addendum)
Set up CT chest without contrast in 3 months (September 2022)  Stop Symbicort  Begin Stiolto 2 puffs daily  Activity as tolerated Work on not smoking .  Follow up with Dr. Chase Caller in 3 months and As needed

## 2020-07-04 NOTE — Assessment & Plan Note (Signed)
Moderate COPD with emphysema.  Patient's lung function has declined over the last several years.  We discussed in detail smoking cessation.  We will change Symbicort over to [employer].  Activity as tolerated.  Plan  Patient Instructions  Set up CT chest without contrast in 3 months (September 2022)  Stop Symbicort  Begin Stiolto 2 puffs daily  Activity as tolerated Work on not smoking .  Follow up with Dr. Chase Caller in 3 months and As needed

## 2020-07-04 NOTE — Progress Notes (Signed)
@Patient  ID: Bridget Mcdonald, female    DOB: Aug 23, 1945, 75 y.o.   MRN: [id number]  Chief Complaint  Patient presents with   Follow-up     Referring provider: Aretta Nip, MD  HPI: 75 year old female active smoker followed for lung nodules, recurrent hemoptysis, emphysema/COPD  TEST/EVENTS :  CT chest September 2020 multiple bilateral solid and groundglass pulmonary nodules CT chest January 04, 2019-resolution of groundglass opacity and pleural nodularity in the right upper lobe minimum residual pleural-parenchymal nodularity decreased in size, smaller scattered pulmonary nodules are improved CT chest August 09, 2019 mild emphysema, decreased interstitial thickening in the right apex consistent with a postinfectious scarring.,  New right-sided pulmonary nodules maximum 4 mm November 11, 2018 autoimmune/connective tissue labs negative except for RA factor XX 4 Bronchoscopy September 2021 normal   07/04/2020 Follow up : COPD with emphysema, hemoptysis, pulmonary nodules Patient returns for a 1 month follow-up.  Patient was seen last visit had had another episode of hemoptysis.  She was treated for acute bronchitis with cephalexin x5 days.  She was set up for a CT high-res completed on July 29, 2020 with no suspicious adenopathy.  Mild to moderate emphysema, right middle lobe and right lower lobe pulmonary nodules resolved.  Several new pulmonary nodules scattered throughout the lungs measuring 0.8 cm max, stable right upper lobe scarring.  No evidence of interstitial lung disease.  Unchanged adrenal hyperplasia and a left renal cyst. We reviewed her CT in detail.  Discussed her incidental findings and to discuss this with her primary care provider. Patient does continue to smoke.  Smoking cessation was discussed in detail and resources were offered. Since last visit patient says she is feeling better.  Her hemoptysis has resolved.  She was set up for a 2D echo with bubble study this  was normal except for mild diastolic dysfunction.  EF was preserved.  And no evidence of shunting. Patient was also set up for pulmonary function testing but was unable to complete.  We did do a an office spirometry today that showed moderate airflow obstruction with FEV1 at 57%, ratio 63, FVC 68%. She is currently on Symbicort but only takes once daily.  She says she has trouble with thrush with this inhaler. Patient says overall breathing is doing about the same.  She gets winded with heavy activities.  She has some intermittent coughing.  No further hemoptysis.  No unintentional weight loss.  Allergies  Allergen Reactions   Demerol [Meperidine]     hallucinations   Epinephrine     Super hyper   Gabapentin Diarrhea   Quinine Derivatives     Temporary loss of hearing    Immunization History  Administered Date(s) Administered   Fluad Quad(high Dose 65+) 10/15/2018   Influenza Split 10/15/2010, 11/09/2015, 11/11/2016, 11/18/2017   Influenza, High Dose Seasonal PF 12/26/2014, 10/28/2018, 11/17/2019   PFIZER(Purple Top)SARS-COV-2 Vaccination 04/08/2019, 05/04/2019, 03/13/2020   Pneumococcal Conjugate-13 08/30/2013, 08/14/2017   Pneumococcal Polysaccharide-23 01/14/2009, 08/25/2012   Tdap 07/05/2009    Past Medical History:  Diagnosis Date   Acid reflux    Allergy history unknown    Asthma, mild intermittent    Barrett's esophagus    dx'd in [place]/09   Cancer (HCC)    skin, breast (left)   Chronic headaches    Cyst of left kidney    mult-locular Dr.Dahlstedt   Hematuria    Dr. Zannie Cove   High cholesterol    History of cardiac arrhythmia    History of colon  polyps    04/2009 [place] unknown histology   Hyperlipidemia    Hypertension    Hypothyroidism    Hypotonia    [name] 09/2012   IBS (irritable bowel syndrome)    Incontinence, feces    09/2012   Migraine headache    Osteopenia    Palpitations 12/01/2012   Echocardiogram - 7/13-normal EF, trace valvular  lesions, mildly AI   PVC's (premature ventricular contractions)    Vertigo    Vestibular neuronitis    herpetic   Vocal cord polyps    sees DR. Cristina Gong    Tobacco History: Social History   Tobacco Use  Smoking Status Every Day   Packs/day: 1.00   Years: 50.00   Pack years: 50.00   Types: Cigarettes   Start date: 1961  Smokeless Tobacco Never  Tobacco Comments   15 cigarettes a day 05/31/2020   Ready to quit: No Counseling given: Yes Tobacco comments: 15 cigarettes a day 05/31/2020   Outpatient Medications Prior to Visit  Medication Sig Dispense Refill   albuterol (VENTOLIN HFA) 108 (90 Base) MCG/ACT inhaler Inhale 2 puffs into the lungs every 6 (six) hours as needed for wheezing or shortness of breath. DX; J43.9 8 g 2   alendronate (FOSAMAX) 70 MG tablet Take 70 mg by mouth every Saturday. Take with a full glass of water on an empty stomach.     Ascorbic Acid (VITAMIN C) 500 MG CHEW Chew 500-1,000 mg by mouth daily.     budesonide-formoterol (SYMBICORT) 80-4.5 MCG/ACT inhaler Inhale 2 puffs into the lungs in the morning and at bedtime. (Patient taking differently: Inhale 2 puffs into the lungs in the morning and at bedtime. Using once a day) 1 each 12   Calcium Carb-Cholecalciferol (CALCIUM 1000 + D) 1000-800 MG-UNIT TABS Take 1 tablet by mouth daily. 750 mg     diltiazem (CARDIZEM CD) 240 MG 24 hr capsule Take 1 capsule (240 mg total) by mouth daily. 90 capsule 3   diltiazem (CARDIZEM) 60 MG tablet Take 1 tablet (60 mg total) by mouth as needed. 30 tablet 1   hyoscyamine (ANASPAZ) 0.125 MG TBDP disintergrating tablet Place 0.125 mg under the tongue every 4 (four) hours as needed (GI spasms).      levothyroxine (SYNTHROID) 150 MCG tablet Take 150 mcg by mouth daily before breakfast.     meclizine (ANTIVERT) 25 MG tablet Take 25 mg by mouth 3 (three) times daily as needed for dizziness.     Multiple Vitamin (MULTIVITAMIN) tablet Take 1 tablet by mouth daily.      potassium  chloride (KLOR-CON) 10 MEQ tablet Take 1 tablet (10 mEq total) by mouth daily. 90 tablet 3   rosuvastatin (CRESTOR) 20 MG tablet Take 1 tablet (20 mg total) by mouth daily. 90 tablet 3   cephALEXin (KEFLEX) 500 MG capsule Take 1 capsule (500 mg total) by mouth 3 (three) times daily. 15 capsule 0   No facility-administered medications prior to visit.     Review of Systems:   Constitutional:   No  weight loss, night sweats,  Fevers, chills,  +fatigue, or  lassitude.  HEENT:   No headaches,  Difficulty swallowing,  Tooth/dental problems, or  Sore throat,                No sneezing, itching, ear ache, nasal congestion, post nasal drip,   CV:  No chest pain,  Orthopnea, PND, swelling in lower extremities, anasarca, dizziness, palpitations, syncope.   GI  No  heartburn, indigestion, abdominal pain, nausea, vomiting, diarrhea, change in bowel habits, loss of appetite, bloody stools.   Resp: .  No chest wall deformity  Skin: no rash or lesions.  GU: no dysuria, change in color of urine, no urgency or frequency.  No flank pain, no hematuria   MS:  No joint pain or swelling.  No decreased range of motion.  No back pain.    Physical Exam  BP (!) 126/58   Pulse 93   Ht 5\' 6"  (1.676 m)   Wt 149 lb (67.6 kg)   LMP  (LMP Unknown)   SpO2 96%   BMI 24.05 kg/m   GEN: A/Ox3; pleasant , NAD, well nourished    HEENT:  [place]/AT,   NOSE-clear, THROAT-clear, no lesions, no postnasal drip or exudate noted.   NECK:  Supple w/ fair ROM; no JVD; normal carotid impulses w/o bruits; no thyromegaly or nodules palpated; no lymphadenopathy.    RESP  Clear  P & A; w/o, wheezes/ rales/ or rhonchi. no accessory muscle use, no dullness to percussion  CARD:  RRR, no m/r/g, no peripheral edema, pulses intact, no cyanosis or clubbing.  GI:   Soft & nt; nml bowel sounds; no organomegaly or masses detected.   Musco: Warm bil, no deformities or joint swelling noted.   Neuro: alert, no focal deficits noted.     Skin: Warm, no lesions or rashes    Lab Results:   BNP No results found for: BNP  ProBNP    Component Value Date/Time   PROBNP 139 04/12/2016 1528    Imaging: CT Chest High Resolution  Result Date: 06/29/2020 CLINICAL DATA:  Recurrent hemoptysis. Dyspnea. Remote history of left breast cancer. Current smoker. Follow-up pulmonary nodules. History of right upper lobe opacities. EXAM: CT CHEST WITHOUT CONTRAST TECHNIQUE: Multidetector CT imaging of the chest was performed following the standard protocol without intravenous contrast. High resolution imaging of the lungs, as well as inspiratory and expiratory imaging, was performed. COMPARISON:  08/09/2019 chest CT. FINDINGS: Cardiovascular: Normal heart size. No significant pericardial effusion/thickening. Atherosclerotic nonaneurysmal thoracic aorta. Normal caliber pulmonary arteries. Mediastinum/Nodes: No discrete thyroid nodules. Unremarkable esophagus. No pathologically enlarged axillary, mediastinal or hilar lymph nodes, noting limited sensitivity for the detection of hilar adenopathy on this noncontrast study. Lungs/Pleura: No pneumothorax. No pleural effusion. Mild-to-moderate centrilobular emphysema. Previously visualized tiny right middle lobe and right lower lobe pulmonary nodules on 08/09/2019 chest CT study have resolved. Several (at least 10) new indistinct pulmonary nodules scattered throughout both lungs, for example measuring 0.8 cm in the posterior right lower lobe (series 9/image 108), 0.7 cm in the anterior left upper lobe (series 9/image 86) and 0.6 cm in the left lower lobe (series 9/image 107). Stable focal subpleural mild curvilinear postinfectious/postinflammatory scarring in the posteromedial apical right upper lobe. Otherwise no significant regions of subpleural reticulation, ground-glass opacity, traction bronchiectasis, architectural distortion or frank honeycombing. No significant lobular air trapping or evidence of  tracheobronchomalacia on the expiration sequence. Upper abdomen: Stable mild bilateral adrenal hyperplasia without discrete adrenal nodules. Lobulated 5.3 cm mildly complex upper left renal cyst with mildly thickened septal and mural calcification, unchanged. Musculoskeletal:  No aggressive appearing focal osseous lesions. IMPRESSION: 1. Waxing and waning pulmonary nodularity. Previously visualized tiny right pulmonary nodules have resolved. Several (at least 10) new indistinct pulmonary nodules, largest 0.8 cm in the right lower lobe. Differential includes Langerhans cell histiocytosis, granulomatosis with polyangiitis (GPA) or other pulmonary vasculitides, and rheumatoid nodules. Suggest smoking cessation and follow-up chest  CT in 3-6 months given patient risk factors. 2. No evidence of interstitial lung disease. 3. Stable mild bilateral adrenal hyperplasia without discrete adrenal nodules. 4. Aortic Atherosclerosis (ICD10-I70.0) and Emphysema (ICD10-J43.9). Electronically Signed   By: Ilona Sorrel M.D.   On: 06/29/2020 19:51   ECHOCARDIOGRAM COMPLETE BUBBLE STUDY  Result Date: 06/21/2020    ECHOCARDIOGRAM REPORT   Patient Name:   OSIE MERKIN Date of Exam: 06/21/2020 Medical Rec #:  [id number]         Height:       66.0 in Accession #:    [id number]        Weight:       153.0 lb Date of Birth:  [birth year]         BSA:          1.785 m Patient Age:    51 years          BP:           122/60 mmHg Patient Gender: F                 HR:           88 bpm. Exam Location:  Outpatient Procedure: 2D Echo and Saline Contrast Bubble Study Indications:    Hemoptysis; Pulmonary infiltrates  History:        Patient has prior history of Echocardiogram examinations, most                 recent 11/27/2018. Arrythmias:PVC; Risk Factors:Hypertension and                 Dyslipidemia.  Sonographer:    Mikki Santee RDCS (AE) Referring Phys: [place]  1. Left ventricular ejection fraction, by  estimation, is 60 to 65%. The left ventricle has normal function. The left ventricle has no regional wall motion abnormalities. Left ventricular diastolic parameters are consistent with Grade I diastolic dysfunction (impaired relaxation).  2. Right ventricular systolic function is normal. The right ventricular size is normal. Tricuspid regurgitation signal is inadequate for assessing PA pressure.  3. The mitral valve is normal in structure. Trivial mitral valve regurgitation.  4. The aortic valve is tricuspid. Aortic valve regurgitation is mild. No aortic stenosis is present.  5. The inferior vena cava is normal in size with greater than 50% respiratory variability, suggesting right atrial pressure of 3 mmHg.  6. Agitated saline contrast bubble study was negative, with no evidence of any interatrial shunt or intrapulmonary shunt. Comparison(s): No significant change from prior study. FINDINGS  Left Ventricle: Left ventricular ejection fraction, by estimation, is 60 to 65%. The left ventricle has normal function. The left ventricle has no regional wall motion abnormalities. The left ventricular internal cavity size was normal in size. There is  no left ventricular hypertrophy. Left ventricular diastolic parameters are consistent with Grade I diastolic dysfunction (impaired relaxation). Normal left ventricular filling pressure. Right Ventricle: The right ventricular size is normal. No increase in right ventricular wall thickness. Right ventricular systolic function is normal. Tricuspid regurgitation signal is inadequate for assessing PA pressure. Left Atrium: Left atrial size was normal in size. Right Atrium: Right atrial size was normal in size. Pericardium: There is no evidence of pericardial effusion. Mitral Valve: The mitral valve is normal in structure. There is mild thickening of the mitral valve leaflet(s). Trivial mitral valve regurgitation. Tricuspid Valve: The tricuspid valve is normal in structure.  Tricuspid valve regurgitation is trivial. Aortic Valve: The aortic valve is tricuspid.  Aortic valve regurgitation is mild. Aortic regurgitation PHT measures 469 msec. No aortic stenosis is present. Pulmonic Valve: The pulmonic valve was normal in structure. Pulmonic valve regurgitation is trivial. Aorta: The aortic root and ascending aorta are structurally normal, with no evidence of dilitation. Venous: The inferior vena cava is normal in size with greater than 50% respiratory variability, suggesting right atrial pressure of 3 mmHg. IAS/Shunts: No atrial level shunt detected by color flow Doppler. Agitated saline contrast was given intravenously to evaluate for intracardiac shunting. Agitated saline contrast bubble study was negative, with no evidence of any interatrial shunt.  LEFT VENTRICLE PLAX 2D LVIDd:         4.40 cm  Diastology LVIDs:         2.70 cm  LV e' medial:    8.05 cm/s LV PW:         0.90 cm  LV E/e' medial:  7.2 LV IVS:        0.90 cm  LV e' lateral:   6.31 cm/s LVOT diam:     2.00 cm  LV E/e' lateral: 9.2 LV SV:         82 LV SV Index:   46 LVOT Area:     3.14 cm  RIGHT VENTRICLE RV S prime:     12.60 cm/s TAPSE (M-mode): 1.9 cm LEFT ATRIUM             Index       RIGHT ATRIUM          Index LA diam:        3.10 cm 1.74 cm/m  RA Area:     9.40 cm LA Vol (A2C):   40.1 ml 22.47 ml/m RA Volume:   16.90 ml 9.47 ml/m LA Vol (A4C):   17.6 ml 9.86 ml/m LA Biplane Vol: 26.7 ml 14.96 ml/m  AORTIC VALVE LVOT Vmax:   145.00 cm/s LVOT Vmean:  94.000 cm/s LVOT VTI:    0.262 m AI PHT:      469 msec  AORTA Ao Root diam: 3.00 cm Ao Asc diam:  3.40 cm MITRAL VALVE MV Area (PHT): 2.28 cm    SHUNTS MV Decel Time: 333 msec    Systemic VTI:  0.26 m MV E velocity: 58.20 cm/s  Systemic Diam: 2.00 cm MV A velocity: 71.30 cm/s MV E/A ratio:  0.82 Gwyndolyn Kaufman MD Electronically signed by Gwyndolyn Kaufman MD Signature Date/Time: 06/21/2020/11:42:11 AM    Final       No flowsheet data found.  No results  found for: NITRICOXIDE      Assessment & Plan:   COPD with emphysema ([place]) Moderate COPD with emphysema.  Patient's lung function has declined over the last several years.  We discussed in detail smoking cessation.  We will change Symbicort over to [employer].  Activity as tolerated.  Plan  Patient Instructions  Set up CT chest without contrast in 3 months (September 2022)  Stop Symbicort  Begin Stiolto 2 puffs daily  Activity as tolerated Work on not smoking .  Follow up with Dr. Chase Caller in 3 months and As needed        Smoker Discussed smoking cessation in detail  Hemoptysis Recurrent hemoptysis over the last couple years.  Work-up has been unrevealing except for waxing and waning lung nodules.  Bronchoscopy in 2021 was unrevealing. Patient is encouraged on smoking cessation.  As noted patient has ongoing pulmonary nodules that we will need close follow-up.  She has a CT  chest planned in 3 months.  Pulmonary nodules Waxing and waning pulmonary nodules dating back to September 2020.  Most recent CT chest this month shows resolution of right-sided nodules.  However new scattered pulmonary nodules were noted with maximum measurement at 0.8 cm.  She will need close follow-up with CT chest in 3 months.    I spent  45  minutes dedicated to the care of this patient on the date of this encounter to include pre-visit review of records, face-to-face time with the patient discussing conditions above, post visit ordering of testing, clinical documentation with the electronic health record, making appropriate referrals as documented, and communicating necessary findings to members of the patients care team.   Rexene Edison, NP 07/04/2020

## 2020-07-04 NOTE — Assessment & Plan Note (Signed)
Recurrent hemoptysis over the last couple years.  Work-up has been unrevealing except for waxing and waning lung nodules.  Bronchoscopy in 2021 was unrevealing. Patient is encouraged on smoking cessation.  As noted patient has ongoing pulmonary nodules that we will need close follow-up.  She has a CT chest planned in 3 months.

## 2020-07-11 ENCOUNTER — Telehealth: Payer: Self-pay | Admitting: *Deleted

## 2020-07-11 NOTE — Telephone Encounter (Signed)
PA request was received from (pharmacy): [facility]  Phone:[phone] Fax: [phone] Medication name and strength: Stiolto Respimat Ordering Provider: Rexene Edison NP  Was PA started with [facility]?: yes If yes, please enter KEY: [id number] Medication tried and failed: Symbicort 80/4.5 Covered Alternatives: Brynda Peon, Trelegy ellipta  Dr. Chase Caller, Tammy is currently out of the office and I am working on a PA for a patient of yours.  This patient has not tried/failed 2 of the covered inhalers:  Anoro, Bevespi or Trelegy Ellipta.  It is unlikely that a PA will be approved given this information.  Would you like to change the inhaler?  If so which one and what strength?  Thank you.

## 2020-07-11 NOTE — Telephone Encounter (Signed)
Bevespi 2 puff bid

## 2020-07-12 NOTE — Telephone Encounter (Signed)
Called patient to discuss medication change. She did not answer. Left message for patient to call back.

## 2020-07-13 ENCOUNTER — Other Ambulatory Visit (HOSPITAL_COMMUNITY): Payer: Self-pay

## 2020-07-13 MED ORDER — BEVESPI AEROSPHERE 9-4.8 MCG/ACT IN AERO: 2.0000 | INHALATION_SPRAY | Freq: Two times a day (BID) | RESPIRATORY_TRACT | 6 refills | Status: DC

## 2020-07-13 NOTE — Telephone Encounter (Signed)
ATC x2, left detailed message (DPR) regarding change in inhaler and to return call to the office. Script for [employer] inhaler sent to [facility] d/t the upcoming holiday.  Will await return call from patient.

## 2020-07-18 NOTE — Telephone Encounter (Signed)
I called and spoke with the pt and notified of bevespi being sent  Pt verbalized understanding  Nothing further needed

## 2020-07-18 NOTE — Telephone Encounter (Signed)
Pt returning a phone call. Pt can be reached at [phone]

## 2020-09-28 ENCOUNTER — Other Ambulatory Visit: Payer: Medicare Other

## 2020-10-03 ENCOUNTER — Ambulatory Visit: Payer: Medicare Other | Admitting: Internal Medicine

## 2020-10-13 ENCOUNTER — Other Ambulatory Visit: Payer: Medicare Other

## 2020-10-25 ENCOUNTER — Other Ambulatory Visit: Payer: Medicare Other

## 2020-12-15 ENCOUNTER — Ambulatory Visit: Payer: Medicare Other | Admitting: Cardiology

## 2021-01-09 ENCOUNTER — Other Ambulatory Visit: Payer: Self-pay | Admitting: *Deleted

## 2021-01-09 MED ORDER — POTASSIUM CHLORIDE ER 10 MEQ PO TBCR: 10.0000 meq | EXTENDED_RELEASE_TABLET | Freq: Every day | ORAL | 0 refills | Status: DC

## 2021-01-09 MED ORDER — ROSUVASTATIN CALCIUM 20 MG PO TABS: 20.0000 mg | ORAL_TABLET | Freq: Every day | ORAL | 0 refills | Status: DC

## 2021-02-02 ENCOUNTER — Ambulatory Visit: Payer: Medicare Other | Admitting: Cardiology

## 2021-02-13 ENCOUNTER — Other Ambulatory Visit: Payer: Self-pay | Admitting: *Deleted

## 2021-02-13 MED ORDER — DILTIAZEM HCL ER COATED BEADS 240 MG PO CP24: 240.0000 mg | ORAL_CAPSULE | Freq: Every day | ORAL | 0 refills | Status: DC

## 2021-02-28 DIAGNOSIS — R7303 Prediabetes: Secondary | ICD-10-CM | POA: Diagnosis not present

## 2021-02-28 DIAGNOSIS — F331 Major depressive disorder, recurrent, moderate: Secondary | ICD-10-CM | POA: Diagnosis not present

## 2021-02-28 DIAGNOSIS — J449 Chronic obstructive pulmonary disease, unspecified: Secondary | ICD-10-CM | POA: Diagnosis not present

## 2021-02-28 DIAGNOSIS — E039 Hypothyroidism, unspecified: Secondary | ICD-10-CM | POA: Diagnosis not present

## 2021-02-28 DIAGNOSIS — Z0000 Encounter for general adult medical examination without abnormal findings: Secondary | ICD-10-CM | POA: Diagnosis not present

## 2021-02-28 DIAGNOSIS — I10 Essential (primary) hypertension: Secondary | ICD-10-CM | POA: Diagnosis not present

## 2021-02-28 DIAGNOSIS — K589 Irritable bowel syndrome without diarrhea: Secondary | ICD-10-CM | POA: Diagnosis not present

## 2021-02-28 DIAGNOSIS — I480 Paroxysmal atrial fibrillation: Secondary | ICD-10-CM | POA: Diagnosis not present

## 2021-02-28 DIAGNOSIS — L858 Other specified epidermal thickening: Secondary | ICD-10-CM | POA: Diagnosis not present

## 2021-02-28 DIAGNOSIS — F17200 Nicotine dependence, unspecified, uncomplicated: Secondary | ICD-10-CM | POA: Diagnosis not present

## 2021-02-28 DIAGNOSIS — E7800 Pure hypercholesterolemia, unspecified: Secondary | ICD-10-CM | POA: Diagnosis not present

## 2021-02-28 DIAGNOSIS — M810 Age-related osteoporosis without current pathological fracture: Secondary | ICD-10-CM | POA: Diagnosis not present

## 2021-03-05 ENCOUNTER — Ambulatory Visit
Admission: RE | Admit: 2021-03-05 | Discharge: 2021-03-05 | Disposition: A | Discharge status: Home / Self Care | Payer: Medicare Other | Source: Ambulatory Visit | Attending: Adult Health | Admitting: Adult Health

## 2021-03-05 ENCOUNTER — Other Ambulatory Visit: Payer: Self-pay

## 2021-03-05 DIAGNOSIS — J449 Chronic obstructive pulmonary disease, unspecified: Secondary | ICD-10-CM

## 2021-03-05 DIAGNOSIS — J439 Emphysema, unspecified: Secondary | ICD-10-CM | POA: Diagnosis not present

## 2021-03-07 ENCOUNTER — Other Ambulatory Visit: Payer: Self-pay | Admitting: Family Medicine

## 2021-03-07 ENCOUNTER — Telehealth: Payer: Self-pay | Admitting: Internal Medicine

## 2021-03-07 DIAGNOSIS — M810 Age-related osteoporosis without current pathological fracture: Secondary | ICD-10-CM

## 2021-03-07 NOTE — Progress Notes (Signed)
ATC x1, LVM to return call.

## 2021-03-07 NOTE — Telephone Encounter (Signed)
Lm for patient.  

## 2021-03-08 NOTE — Telephone Encounter (Signed)
Great news several lung nodules have resolved . The remaining nodules are stable in size. Stable scarring . No adenopathy Will discuss in detail with Dr. Chase Caller at [facility] coming up . Will decide if additional imaging is indicated going forward    Incidentals: Atherosclerosis noted discuss with PCP .  Patient is aware of results and voiced her understanding.  Nothing further needed.

## 2021-03-20 ENCOUNTER — Ambulatory Visit (INDEPENDENT_AMBULATORY_CARE_PROVIDER_SITE_OTHER): Payer: Medicare Other | Admitting: Internal Medicine

## 2021-03-20 ENCOUNTER — Encounter: Payer: Self-pay | Admitting: Internal Medicine

## 2021-03-20 ENCOUNTER — Other Ambulatory Visit: Payer: Self-pay

## 2021-03-20 VITALS — BP 140/60 | HR 94 | Temp 98.7°F | Ht 66.0 in | Wt 146.0 lb

## 2021-03-20 DIAGNOSIS — R918 Other nonspecific abnormal finding of lung field: Secondary | ICD-10-CM | POA: Diagnosis not present

## 2021-03-20 DIAGNOSIS — R053 Chronic cough: Secondary | ICD-10-CM

## 2021-03-20 DIAGNOSIS — J439 Emphysema, unspecified: Secondary | ICD-10-CM

## 2021-03-20 DIAGNOSIS — F17200 Nicotine dependence, unspecified, uncomplicated: Secondary | ICD-10-CM | POA: Diagnosis not present

## 2021-03-20 DIAGNOSIS — J449 Chronic obstructive pulmonary disease, unspecified: Secondary | ICD-10-CM | POA: Diagnosis not present

## 2021-03-20 NOTE — Progress Notes (Signed)
HPI  IOV 11/03/2018  Chief Complaint  Patient presents with   Consult     Bridget Mcdonald is 76 y.o. 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  She is a heavy smoker.  She has multiple complaints with the main reason for referral and the main complaint currently is 1 of hemoptysis ongoing for couple of months.  It is new in onset.  She believes she might have something autoimmune going on.  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  In addition for the last 2 or 3 years she has had dry skin in her lower extremities and the skin is flaky.  She states that the etiologic diagnosis for both these conditions is idiopathic.  Then approximately 2 at the end of August she started developing hemoptysis.  Since then it has persisted although the active frank blood is no longer present since October 04, 2018 or so.  She states the first episode of hemoptysis started August 27, 2018 and went through the whole weekend.  Then around September 06, 2018 she was in bed with symptoms ranging from coughing of blood clots fatigue, freezing cold, nausea and uncontrollable diarrhea and a sense that her atrial fibrillation was not under control.  The following week she did have right neck and ear pain with pressure pain in the left lower lateral lung in the lower part.  Fatigue also started.  She had a CT scan of the chest on September 9 that shows right upper lobe posterior segment groundglass opacities consistent with local alveolar hemorrhage in my personal visualization and opinion.  However she points to the left lower lobe infrascapular area as a point of discomfort.  In this area there is a small subpleural nodularity.  Then from mid September through October 10, 2018 she took Avelox she continued to have fatigue and  anxiety.  Around this timeframe hemoptysis resolved.  She says this is mainly because she is controlling her cough and clearing her throat.  But the chest pressure has persisted.  She feels the neuropathy has worsened.  She feels the same amount of edema as well.  She is also feels she is constantly hungry and rapidly gaining weight  She continues to smoke heavily.  Review of labs show nonbacterial microscopic hematuria in 2017.  She does not recollect taking antibiotics for this.  She had an echocardiogram in 2017 that was normal but she says she has a history of mitral valve prolapse.  She also has shortness of breath with exertion relieved by rest although walking desaturation test showed she did not desaturate.  She walked 185 feet x 3 laps in office with a resting pulse ox of 99% and the resting heart rate of 96/min.  When she finished she was pulse ox 100% and a heart rate of 117/min.  IMPRESSION: CT chest 1. Interlobular septal line thickening and ground-glass opacities in the medial right upper lobe. This is nonspecific, but can see be seen in pulmonary hemorrhage syndromes, pneumonia, and pulmonary alveolar proteinosis. 2. Multiple bilateral solid and ground-glass pulmonary nodules. Non-contrast chest CT at 3-6 months is recommended. If the nodules are stable at time of repeat CT, then future CT at 18-24 months (from today's scan) is considered optional for low-risk patients, but  is recommended for high-risk patients. This recommendation follows the consensus statement: Guidelines for Management of Incidental Pulmonary Nodules Detected on CT Images: From the Fleischner Society 2017; Radiology 2017; 284:228-243.   Aortic Atherosclerosis (ICD10-I70.0).     Electronically Signed   By: Zerita Boers M.D.   On: 09/24/2018 09:40     OV 01/11/2019  Subjective:  Patient ID: Bridget Mcdonald, female , DOB: 1945-05-29 , age 37 y.o. , MRN: [id number] , ADDRESS: [place] [address]   01/11/2019 -   Chief Complaint  Patient presents with   Follow-up    Pt states she has been doing okay since last visit. Pt said she has been coughing up green-yellow phlegm which has been going on x4 days.     HPI [name] 76 y.o. -presents for follow-up of review of her results.  Since her last visit in October she has not had any more hemoptysis.  She had a CT scan of the chest that I personally visualized.  The pulmonary infiltrate is resolved.  The nodules have improved.  She tells me that her smoking has relapsed.  There is a history of microscopic hematuria but her repeat UA shows only 3-6 RBCs per high-power field.  At this point in time she says that she has been isolating pretty well.  There is no clusters for COVID-19.  However for the last day or so she has had cough increase with yellow-green phlegm.  She thinks is because her smoking has relapsed.  She thinks if she quit smoking this will improve.  She is not keen on antibiotics of prednisone because of multiple different allergies.  We reviewed her pulmonary function test from 2014 and this shows isolated reduction diffusion capacity.  Her current CT chest in my personal visualization shows some emphysema although this is not reported in the official report.  She admits to anxiety particularly when she quit smoking.   IMPRESSION:  Lungs/Pleura: Resolution of the region of ground-glass opacity and pleural nodularity in the RIGHT upper lobe. There is minimal residual pleuroparenchymal nodularity measuring 16 mm x 9 mm decreased from 22 mm x 12 mm on prior (image 24/8).   Smaller scattered pulmonary nodules also are improved.   For example RIGHT lower lobe nodule measuring 5 mm (image 90/8) compares to 7 mm.   Several adjacent LEFT lower lobe nodules are also decreased. For example 2 mm nodule (image 80/8) decreased from 7 mm.   LEFT upper lobe nodule previously on image 36/3 has  resolved completely.  1. Near complete resolution of RIGHT upper lobe ground-glass opacity and pleural nodularity. Interval decrease in size of bilateral pulmonary nodules. Findings consistent with resolving infectious or inflammatory process. 2. Aortic Atherosclerosis (ICD10-I70.0).     Electronically Signed   By: Suzy Bouchard M.D.   On: 01/04/2019 08:13   Results for ANNISTON, NELLUMS (MRN [id number]) as of 01/11/2019 09:23  Ref. Range 11/03/2018 12:24  ANA Titer 1 Unknown Negative  ANCA Proteinase 3 Latest Ref Range: 0.0 - 3.5 U/mL <3.5  Angiotensin-Converting Enzyme Latest Ref Range: 9 - 67 U/L 20  Cyclic Citrullin Peptide Ab Latest Units: UNITS <16  dsDNA Ab Latest Ref Range: 0 - 9 IU/mL <1  ENA RNP Ab Latest Ref Range: 0.0 - 0.9 AI <0.2  ENA SSA (RO) Ab Latest Ref Range: 0.0 - 0.9 AI <0.2  ENA SSB (LA) Ab Latest Ref Range: 0.0 - 0.9 AI <0.2  Myeloperoxidase Abs Latest Units: AI <1.0  Serine Protease 3 Latest Units: AI <1.0  RA Latex Turbid. Latest Ref Range: <14 IU/mL 24 (H)  ENA SM Ab Ser-aCnc Latest Ref Range: 0.0 - 0.9 AI <0.2  Scleroderma (Scl-70) (ENA) Antibody, IgG Latest Ref Range: 0.0 - 0.9 AI <0.2    Results for RALIYAH, MONTELLA (MRN [id number]) as of 01/11/2019 09:23  Ref. Range 11/03/2018 12:24  RBC / HPF Latest Ref Range: 0-2/hpf  3-6/hpf (A)  Results for CLOIE, WOODEN (MRN [id number]) as of 01/11/2019 09:23  Ref. Range 04/25/2015 15:49  RBC / HPF Latest Ref Range: 0 - 5 RBC/hpf TOO NUMEROUS TO COUNT   Sep 09, 2019 tele visit  63-year-old female active smoker seen for pulmonary consult October 2020 for hemoptysis Retired Arts development officer, from [place].  Today's televisit is for an acute work in for hemoptysis.  Patient has had several episodes of hemoptysis over the last year.  This initially started about 1 year ago.  She was seen for pulmonary consult in October 2020.  CT chest showed groundglass opacities in the middle right upper lobe.   And multiple solid and groundglass pulmonary nodules scattered.  She had autoimmune testing that was essentially unrevealing. Follow-up CT chest December 2020 showed near complete resolution of right upper lobe groundglass opacity.  And decreased size of bilateral pulmonary nodules. Recent CT chest August 09, 2019 showed further improvement in the infectious postinflammatory interstitial thickening.  And some new right-sided pulmonary nodules maximum 4 mm. Patient complains that approximately 1 to 2 days ago she started coughing up some blood again. She has some increased cough and congestion.  Has increased fatigue.  Has some increased chest tightness.  Patient was recommended to begin Keflex 500 mg 3 times daily for 5 days.  And to be set up for a bronchoscopy.  Patient says she continues to have some increased congestion has not picked up her antibiotics.  We went over recommendations for her bronchoscopy with patient education given.  Patient says unfortunately she has no transportation as she has no one to take her or pick her up from the procedure.  Given patient's recurrent hemoptysis increased shortness of breath and inability for procedure.  I recommend patient seek emergency room care as inability to evaluate properly via televisit.  Patient declines says she has not going to the emergency room. Patient does say that she will come in and get labs and a chest x-ray.  And wants [place] to try to find her medical transportation for procedure.    Observations/Objective: CT chest 07/2019 -Further improvement in previously described post infectious/inflammatory interstitial thickening, consistent with scarring. 2. New right-sided pulmonary nodules x2, 3 maximally 4 mm. Non-contrast chest CT can be considered in 12 months, given risk factors for primary bronchogenic carcinoma.   Assessment and Plan: Recurrent hemoptysis and active smoker.  Questionable etiology.  Unable to adequately assess patient via  televisit.  Have recommended emergency room/urgent care evaluation patient declines.  Patient says she will come to the office for chest x-ray and labs if she can get here before our office closes. Per Dr. Chase Caller with request for bronchoscopy.  Lab work will be done including CBC c-Met and a PT/INR. We will contact community resources see if medical transportation is an option for this patient.  Plan  Patient Instructions  Take antibiotics as directed Come to the office for a chest x-ray and labs We will call you with information regarding bronchoscopy Follow-up in the office with Dr. Chase Caller in 1 week and as  needed Please contact office for sooner follow up if symptoms do not improve or worsen or seek emergency care     xxxxxxxxxxxxxxxxxxxxxxx OV 09/16/2019  Subjective:  Patient ID: Bridget Mcdonald, female , DOB: [birth year] , age 60 y.o. , MRN: [id number] , ADDRESS: [place] [address]   09/16/2019 -   Chief Complaint  Patient presents with   Follow-up    Hemoptysis     HPI Tameka Hoiland 76 y.o. -patient follows up from her recent hemoptysis.  She tells me she had abrupt hemoptysis it was small in amount.  After we give antibiotics and she is a Designer, jewellery it is resolved.  I recommended a bronchoscopy but she tells me that there is significant schedule issues.  She does not have transport.  I want to do the bronchoscopy last week.  She has no neighbors or friends who could bring her.  She is not allowed to take a ride back on her own because of the sedation issues.  So she is here to discuss her options.  We discussed the possibility about doing a bronchoscopy in the afternoon and putting her overnight as observation given high risk and social issues.  She is willing for that.  At this point in time no hemoptysis and she feels back to baseline.  She question me about the recent chest x-ray changes and we reviewed that.    OV 02/01/2020  Subjective:   Patient ID: Bridget Mcdonald, female , DOB: [birth year] , age 16 y.o. , MRN: [id number] , ADDRESS: [address]  [place] [id number] PCP Rankins, Bill Salinas, MD Patient Care Team: Rankins, Bill Salinas, MD as PCP - General (Family Medicine) Jerline Pain, MD as PCP - Cardiology (Cardiology)  This Provider for this visit: Treatment Team:  Attending Provider: Brand Males, MD    02/01/2020 -   Chief Complaint  Patient presents with   Follow-up    Coughing and wheezing more at night, SOB with exertion   #Smoker #History of hemoptysis -normal bronchoscopy September 2021 #Emphysema on CT chest last PFT 2014 #Lung nodules #Pulm infiltrates  HPI Laquia Rosano 76 y.o. -returns for follow-up.  In September 2021 she underwent bronchoscopy.  Airways were normal.  Since then the hemoptysis resolved but she tells me at this visit that since then at nighttime she is having cough with wheezing as when she lies down but does not wake her up in the middle of the night.  Her RSI Score is listed below and is significantly high suggesting irritable larynx syndrome.  She also gets cough when she chokes on some food or when she eats some food.  But there is no clear-cut aspiration. COPD CAT score itself is 22 which is stable and baseline.  She tried to take Spiriva and this was both expensive and also cause paradoxical cough. She had a CT scan in July 2021 showed improvement or resolution of the pulm infiltrates but she has 2 new pulmonary nodules 4 mm on the right side.    CAT Score 02/01/2020 09/09/2019  Total CAT Score 22 21      CT Chest data  No results found.    PFT  No flowsheet data found.   No flowsheet data found.    IMPRESSION: 1. Further improvement in previously described post infectious/inflammatory interstitial thickening, consistent with scarring. 2. New right-sided pulmonary nodules x2, 3 maximally 4 mm. Non-contrast chest CT can be considered in 12  months, given risk factors for primary  bronchogenic carcinoma. This recommendation follows the consensus statement: Guidelines for Management of Incidental Pulmonary Nodules Detected on CT Images: From the Fleischner Society 2017; Radiology 2017; 284:228-243. 3. Aortic atherosclerosis (ICD10-I70.0), coronary artery atherosclerosis and emphysema (ICD10-J43.9). 4. Incompletely imaged upper pole left renal lesion, decreased in size and present back to 2017, presumably a complex cyst.     Electronically Signed   By: Abigail Miyamoto M.D.   On: 08/09/2019 20:13    OV 05/31/2020  Subjective:  Patient ID: Bridget Mcdonald, female , DOB: January 02, 1946 , age 71 y.o. , MRN: [id number] , ADDRESS: [address] [place] [id number] PCP Rankins, Bill Salinas, MD Patient Care Team: Aretta Nip, MD as PCP - General (Family Medicine) Jerline Pain, MD as PCP - Cardiology (Cardiology)  This Provider for this visit: Treatment Team:  Attending Provider: Brand Males, MD    05/31/2020 -   Chief Complaint  Patient presents with   Follow-up    Coughing up blood since Friday. Today she says it is less blood mostly with mucus. Having cramps in rib area. Using Symbicort once a day instead of twice. But has not taken any inhalers since Friday when bleeding started. Feeling weak   #Smoker #History of hemoptysis -normal bronchoscopy September 2021 #Emphysema on CT chest last PFT 2014 #Lung nodules #Pulm infiltrates  HPI Jasneet Schobert 76 y.o. -presents for follow-up.  At this visit she was supposed to have pulmonary function test but she could not perform because of technical issues with performance skills.  She tells me she was doing okay except she would have occasional mild amounts of dark hemoptysis.  She says this always happens when she rolls over to the right side.  Likewise: On Friday, 05/26/2020 she turned over in bed to the right side and then all of a sudden she could feel  bubbles in her chest and then she tried to get to the restroom and by the time she was coughing up a lot of blood.  She says it was significant.  Since then the color has improved and the volume has improved.  She states please come unprovoked but it appears the pattern appears to be when she is turning to her right side in the bed.  The only exception to this was in 2020 when she was on the phone otherwise it always happens when she turns to the right side.  There is no fever or chills.  No COVID symptoms.  No worsening cough no wheezing no other sputum.  She does not feel she is safe she does not feel for COPD exacerbation.  He is open to taking antibiotics.  She is frustrated that we have not found an etiology so far.  She still is smoking and is trying to quit.   Also complains of cramps in her lower chest bilaterally.      07/04/2020 Follow up : COPD with emphysema, hemoptysis, pulmonary nodules   TEST/EVENTS :    Patient returns for a 1 month follow-up.  Patient was seen last visit had had another episode of hemoptysis.  She was treated for acute bronchitis with cephalexin x5 days.  She was set up for a CT high-res completed on July 29, 2020 with no suspicious adenopathy.  Mild to moderate emphysema, right middle lobe and right lower lobe pulmonary nodules resolved.  Several new pulmonary nodules scattered throughout the lungs measuring 0.8 cm max, stable right upper lobe scarring.  No evidence of interstitial lung disease.  Unchanged adrenal  hyperplasia and a left renal cyst. We reviewed her CT in detail.  Discussed her incidental findings and to discuss this with her primary care provider. Patient does continue to smoke.  Smoking cessation was discussed in detail and resources were offered. Since last visit patient says she is feeling better.  Her hemoptysis has resolved.  She was set up for a 2D echo with bubble study this was normal except for mild diastolic dysfunction.  EF was preserved.   And no evidence of shunting. Patient was also set up for pulmonary function testing but was unable to complete.  We did do a an office spirometry today that showed moderate airflow obstruction with FEV1 at 57%, ratio 63, FVC 68%. She is currently on Symbicort but only takes once daily.  She says she has trouble with thrush with this inhaler. Patient says overall breathing is doing about the same.  She gets winded with heavy activities.  She has some intermittent coughing.  No further hemoptysis.  No unintentional weight loss.   CT chest September 2020 multiple bilateral solid and groundglass pulmonary nodules CT chest January 04, 2019-resolution of groundglass opacity and pleural nodularity in the right upper lobe minimum residual pleural-parenchymal nodularity decreased in size, smaller scattered pulmonary nodules are improved CT chest August 09, 2019 mild emphysema, decreased interstitial thickening in the right apex consistent with a postinfectious scarring.,  New right-sided pulmonary nodules maximum 4 mm November 11, 2018 autoimmune/connective tissue labs negative except for RA factor XX 4 Bronchoscopy September 2021 normal  OV 03/20/2021  Subjective:  Patient ID: Bridget Mcdonald, female , DOB: 05/11/1945 , age 58 y.o. , MRN: [id number] , ADDRESS: [place] [id number] PCP Rankins, Bill Salinas, MD Patient Care Team: Aretta Nip, MD as PCP - General (Family Medicine) Jerline Pain, MD as PCP - Cardiology (Cardiology)  This Provider for this visit: Treatment Team:  Attending Provider: Brand Males, MD   #Smoker #History of hemoptysis -normal bronchoscopy September 2021 #Emphysema on CT chest last PFT 2014 #Lung nodules  and #Pulm infiltrates -in CT scan of the chest 2021  -Significantly improved in February 2023 CT chest #Chronic sinus drainage #Fosamax use  #Negative bubble study echocardiogram 2022  03/20/2021 -   Chief Complaint  Patient  presents with   Follow-up    Has good days and bad days.  More coughing and sob with lying down.     HPI Juleah Paradise 76 y.o. -returns for follow-up.  Personally last seen in May 2022.  After that saw nurse practitioner.  She continues to smoke.  She had CT scan of the chest February 2023 and the nodules and infiltrates are significantly improved.  There is no further hemoptysis.  She continues to struggle to quit smoking.  She continues on [employer].  She uses it once a day.  She continues to be symptomatic.  She is frustrated by her symptoms.  She says that she is researched extensively and the symptoms do not fit classic COPD.  I agreed with that for most parts.  In particular she has good days and bad days.  She has a cough she has a sinus drainage.  All this fits in with COPD.  However she does state that she sometimes feels like she is got shattered glass in her chest especially in the front.  When she changes position it resolves.  She continues to still have left infrascapular dull pain that makes it feels like she has a lump in her  chest especially when she lies down on the right side.  This frustrates her.  She continues have significant sinus drainage and associated throat congestion.  Very  We discussed about changing inhaler therapy.  She was not too enthused about it.  This is mainly because she has reservation against new medications and many medications.  We discussed about adding 3% saline nebulizer.  She was open to this idea.  We sent in a prescription.  Also recommended saline nasal spray.  Initially recommended steroid nasal spray but she said she had to talk to her cardiologist.  Med review shows that she is on Fosamax.  I did indicate to her that this could cause significant acid reflux but she said that her primary care physician wanted her to continue this.  I have asked her to talk to her primary care physician about this.  Smoking: Continues to smoke.  Finds it challenging to  quit.   She had bubble echocardiogram and this is negative      Dr Lorenza Cambridge Reflux Symptom Index (> 13-15 suggestive of LPR cough) Jan 2022 03/20/2021   Hoarseness of problem with voice 0 0  Clearing  Of Throat 2 3  Excess throat mucus or feeling of post nasal drip 3 5  Difficulty swallowing food, liquid or tablets 0 1  Cough after eating or lying down 3 4  Breathing difficulties or choking episodes 3 2  Troublesome or annoying cough 5 3  Sensation of something sticking in throat or lump in throat 4 3  Heartburn, chest pain, indigestion, or stomach acid coming up 1 3  TOTAL 21 24    ECHO June 2022   IMPRESSIONS     1. Left ventricular ejection fraction, by estimation, is 60 to 65%. The  left ventricle has normal function. The left ventricle has no regional  wall motion abnormalities. Left ventricular diastolic parameters are  consistent with Grade I diastolic  dysfunction (impaired relaxation).   2. Right ventricular systolic function is normal. The right ventricular  size is normal. Tricuspid regurgitation signal is inadequate for assessing  PA pressure.   3. The mitral valve is normal in structure. Trivial mitral valve  regurgitation.   4. The aortic valve is tricuspid. Aortic valve regurgitation is mild. No  aortic stenosis is present.   5. The inferior vena cava is normal in size with greater than 50%  respiratory variability, suggesting right atrial pressure of 3 mmHg.   6. Agitated saline contrast bubble study was negative, with no evidence  of any interatrial shunt or intrapulmonary shunt.   Comparison(s): No significant change from prior study.  CT chest Feb 2023 Narrative & Impression  CLINICAL DATA:  COPD. History of breast cancer with lumpectomy as well as skin cancer. Surveillance.   EXAM: CT CHEST WITHOUT CONTRAST   TECHNIQUE: Multidetector CT imaging of the chest was performed following the standard protocol without IV contrast.   RADIATION DOSE  REDUCTION: This exam was performed according to the departmental dose-optimization program which includes automated exposure control, adjustment of the mA and/or kV according to patient size and/or use of iterative reconstruction technique.   COMPARISON:  Chest CT dated 06/29/2020.   FINDINGS: Evaluation of this exam is limited in the absence of intravenous contrast.   Cardiovascular: There is no cardiomegaly or pericardial effusion. Mild atherosclerotic calcification of the thoracic aorta. No aneurysmal dilatation. Mildly dilated pulmonary arteries suggestive of pulmonary hypertension.   Mediastinum/Nodes: No hilar or mediastinal adenopathy. The esophagus and the  thyroid gland are grossly unremarkable. No mediastinal fluid collection.   Lungs/Pleura: Background of centrilobular emphysema. Linear scarring in the lingula. Areas of reticular nodularity along the posteromedial subpleural right upper lobe (28/6) appears similar to prior CT. Interval resolution of the previously seen nodule in the right upper lobe, seen on image 49/9 of the prior CT. Additional nodules seen on the prior CT involving the right lung base are not visualized on today's exam. Interval decrease in the left lower lobe nodule previously measured 6 mm on image 107/9 of the prior CT now measuring approximately 4 mm (104/6). Additional subpleural nodule in the left lower lobe seen on the prior CT has resolved on today's exam. Significant interval decrease in the size of the previously seen 7 mm nodule in the left upper lobe now measuring approximately 2 mm (82/6). No focal consolidation, pleural effusion, or pneumothorax. The central airways are patent.   Upper Abdomen: Similar appearance of bilateral adrenal thickening/hyperplasia. Partially visualized multilobulated and septated appearing cyst in the upper pole of the left kidney with linear septal calcification. Similar appearance of a curvilinear hypodense  area in the dome of the liver.   Musculoskeletal: No acute osseous pathology.   IMPRESSION: 1. Interval decrease in the size of the previously seen bilateral pulmonary nodules. No new or enlarging pulmonary nodules. 2. Mildly dilated pulmonary arteries suggestive of pulmonary hypertension. 3. Aortic Atherosclerosis (ICD10-I70.0) and Emphysema (ICD10-J43.9).     Electronically Signed   By: Anner Crete M.D.   On: 03/06/2021 02:30     has a past medical history of Acid reflux, Allergy history unknown, Asthma, mild intermittent, Barrett's esophagus, Cancer (HCC), Chronic headaches, Cyst of left kidney, Hematuria, High cholesterol, History of cardiac arrhythmia, History of colon polyps, Hyperlipidemia, Hypertension, Hypothyroidism, Hypotonia, IBS (irritable bowel syndrome), Incontinence, feces, Migraine headache, Osteopenia, Palpitations (12/01/2012), PVC's (premature ventricular contractions), Vertigo, Vestibular neuronitis, and Vocal cord polyps.   reports that she has been smoking cigarettes. She started smoking about 62 years ago. She has a 50.00 pack-year smoking history. She has never used smokeless tobacco.  Past Surgical History:  Procedure Laterality Date   APPENDECTOMY  1964   BREAST LUMPECTOMY Left 2005   COLONOSCOPY  04/2009   OOPHORECTOMY  1968?   SKIN SURGERY  08/2012   TONSILECTOMY, ADENOIDECTOMY, BILATERAL MYRINGOTOMY AND TUBES  1961   TUBAL LIGATION     VIDEO BRONCHOSCOPY N/A 10/07/2019   Procedure: VIDEO BRONCHOSCOPY WITHOUT FLUORO;  Surgeon: Brand Males, MD;  Location: [facility];  Service: Cardiopulmonary;  Laterality: N/A;    Allergies  Allergen Reactions   Demerol [Meperidine]     hallucinations   Epinephrine     Super hyper   Gabapentin Diarrhea   Quinine Derivatives     Temporary loss of hearing    Immunization History  Administered Date(s) Administered   Fluad Quad(high Dose 65+) 10/15/2018   Influenza Split 10/15/2010, 11/09/2015,  11/11/2016, 11/18/2017   Influenza, High Dose Seasonal PF 12/26/2014, 10/28/2018, 11/17/2019   PFIZER(Purple Top)SARS-COV-2 Vaccination 04/08/2019, 05/04/2019, 12/06/2019, 03/13/2020   Pneumococcal Conjugate-13 08/30/2013, 08/14/2017   Pneumococcal Polysaccharide-23 01/14/2009, 08/25/2012   Tdap 07/05/2009, 02/28/2021    Family History  Problem Relation Age of Onset   Emphysema Mother    Cancer Father        prostate   Heart disease Paternal Grandfather    Cancer Brother        prostate   Bladder Cancer Other      Current Outpatient Medications:    albuterol (  VENTOLIN HFA) 108 (90 Base) MCG/ACT inhaler, Inhale 2 puffs into the lungs every 6 (six) hours as needed for wheezing or shortness of breath. DX; J43.9, Disp: 8 g, Rfl: 2   alendronate (FOSAMAX) 70 MG tablet, Take 70 mg by mouth every Saturday. Take with a full glass of water on an empty stomach., Disp: , Rfl:    Calcium 200 MG TABS, Take 1 tablet by mouth daily., Disp: , Rfl:    diltiazem (CARDIZEM CD) 240 MG 24 hr capsule, Take 1 capsule (240 mg total) by mouth daily., Disp: 90 capsule, Rfl: 0   diltiazem (CARDIZEM) 60 MG tablet, Take 1 tablet (60 mg total) by mouth as needed., Disp: 30 tablet, Rfl: 1   hyoscyamine (ANASPAZ) 0.125 MG TBDP disintergrating tablet, Place 0.125 mg under the tongue every 4 (four) hours as needed (GI spasms). , Disp: , Rfl:    levothyroxine (SYNTHROID) 150 MCG tablet, Take 150 mcg by mouth daily before breakfast., Disp: , Rfl:    meclizine (ANTIVERT) 25 MG tablet, Take 25 mg by mouth 3 (three) times daily as needed for dizziness., Disp: , Rfl:    Multiple Vitamin (MULTIVITAMIN) tablet, Take 1 tablet by mouth daily. , Disp: , Rfl:    potassium chloride (KLOR-CON) 10 MEQ tablet, Take 1 tablet (10 mEq total) by mouth daily., Disp: 90 tablet, Rfl: 0   rosuvastatin (CRESTOR) 20 MG tablet, Take 1 tablet (20 mg total) by mouth daily., Disp: 90 tablet, Rfl: 0   Tiotropium Bromide-Olodaterol 2.5-2.5 MCG/ACT  AERS, Inhale 2 puffs into the lungs daily., Disp: 1 each, Rfl: 2      Objective:   Vitals:   03/20/21 1624  BP: 140/60  Pulse: 94  Temp: 98.7 F (37.1 C)  TempSrc: Oral  SpO2: 94%  Weight: 146 lb (66.2 kg)  Height: 5' 6"  (1.676 m)    Estimated body mass index is 23.57 kg/m as calculated from the following:   Height as of this encounter: 5' 6"  (1.676 m).   Weight as of this encounter: 146 lb (66.2 kg).  @WEIGHTCHANGE @  [employer]   03/20/21 1624  Weight: 146 lb (66.2 kg)     Physical Exam    General: No distress.  Looks well but smells of tobacco Neuro: Alert and Oriented x 3. GCS 15. Speech normal Psych: Pleasant Resp:  Barrel Chest - no.  Wheeze - no, Crackles - no, No overt respiratory distress CVS: Normal heart sounds. Murmurs - no Ext: Stigmata of Connective Tissue Disease - no HEENT: Normal upper airway. PEERL +. No post nasal drip        Assessment:       ICD-10-CM   1. COPD with chronic bronchitis and emphysema ([place])  J44.9 Ambulatory Referral for DME    2. Smoker  F17.200     3. Pulmonary nodules  R91.8     4. Pulmonary emphysema, unspecified emphysema type ([place])  J43.9     5. Chronic cough  R05.3          Plan:     Patient Instructions  Pulmonary infiltrates -  improved on CT Jan 04, 2019 and July 2021 CT - redudec feb 2023  Pulmonary nodules - 2 new nodules 60m right side July 2021 CT - reduced feb 2023  History of ongoing Smoking - unable to quit completely  Pulmonary emphysema, unspecified emphysema type ([place] - as seen on CT  -  2014 PFt with moderate reduction in diffusion  - on stiolto  Recurrent hemoptysis - no cause identified so far; again May 2022 - curently in remissio as of 03/20/2021  Multiple various respiratory complaints and sinus drainage -can just be your baseline  Plan - continue prior stiolto - start 3% saline nebulizer 35m daily - get [facility]  - do saline nasal spray daily  - stop  fosfamax asfter talking to PCP Rankins, VBill Salinas MD -Monitor symptoms and learn to recognize good days and bad days  -and band of existence  -Intervene if symptoms vary from baseline band  Followup  -3 months with Joevon Holliman - 15 min visit   ( Level 05 visit: Estb 40-54 min   visit type: on-site physical face to visit  in total care time and counseling or/and coordination of care by this undersigned MD - Dr MBrand Males This includes one or more of the following on this same day 03/20/2021: pre-charting, chart review, note writing, documentation discussion of test results, diagnostic or treatment recommendations, prognosis, risks and benefits of management options, instructions, education, compliance or risk-factor reduction. It excludes time spent by the [place] office staff in the care of the patient. Actual time 428min)   SIGNATURE    Dr. MBrand Males M.D., F.C.C.P,  Pulmonary and Critical Care Medicine Staff Physician, CCraigDirector - Interstitial Lung Disease  Program  [place] [place] [id number] Pager: [phone] If no answer or between  15:00h - 7:00h: call [phone] Telephone: [phone]  6:24 PM 03/20/2021

## 2021-03-20 NOTE — Patient Instructions (Addendum)
Pulmonary infiltrates ?-  improved on CT Jan 04, 2019 and July 2021 CT ?- redudec feb 2023 ? ?Pulmonary nodules ?- 2 new nodules 3m right side July 2021 CT ?- reduced feb 2023 ? ?History of ongoing Smoking ?- unable to quit completely ? ?Pulmonary emphysema, unspecified emphysema type ([place] - as seen on CT ? -  2014 PFt with moderate reduction in diffusion ? - on stiolto ? ?Recurrent hemoptysis - no cause identified so far; again May 2022 ?- curently in remissio as of 03/20/2021 ? ?Multiple various respiratory complaints and sinus drainage -can just be your baseline ? ?Plan ?- continue prior stiolto ?- start 3% saline nebulizer 347mdaily - get [facility] ? - do saline nasal spray daily ? - stop fosfamax asfter talking to PCP Rankins, ViBill SalinasMD ?-Monitor symptoms and learn to recognize good days and bad days  -and band of existence ? -Intervene if symptoms vary from baseline band ? ?Followup ? -3 months with Fern Canova - 15 min visit ?

## 2021-03-21 MED ORDER — SODIUM CHLORIDE 3 % IN NEBU: INHALATION_SOLUTION | RESPIRATORY_TRACT | 3 refills | Status: DC | PRN

## 2021-03-21 NOTE — Addendum Note (Signed)
Addended by: Vanessa Barbara on: 03/21/2021 02:43 PM ? ? Modules accepted: Orders ? ?

## 2021-03-22 ENCOUNTER — Encounter: Payer: Self-pay | Admitting: Internal Medicine

## 2021-03-22 ENCOUNTER — Other Ambulatory Visit: Payer: Self-pay | Admitting: *Deleted

## 2021-03-22 DIAGNOSIS — J449 Chronic obstructive pulmonary disease, unspecified: Secondary | ICD-10-CM

## 2021-03-30 ENCOUNTER — Telehealth: Payer: Self-pay | Admitting: Internal Medicine

## 2021-03-30 NOTE — Telephone Encounter (Signed)
ATC Melissa with [employer], LVM to return call regarding sodium chloride neb solution.  Advised that this patient needs a supply and we have contacted 2 pharmacies that currently do not have it.  Will await return call from [facility]. ?

## 2021-03-30 NOTE — Telephone Encounter (Signed)
Called Melissa but she did not answer. Left a message for her to call back on Monday.  ?

## 2021-04-02 NOTE — Telephone Encounter (Signed)
Good morning,  ? ?We received additional information from our pharmacist: ? ?One of the manufacturers of Albuterol is focusing all efforts on producing as much Albuterol as possible at this time.  They have stopped production of Hypertonic Saline 3% and 7%.  We expect a shortage of Hypertonic Saline for the next month or two. ? ?We now have Acetylcysteine 20% in Stock to treat those patients with "thick tenacious mucus".   ?The standard order would be: ?Acetylcysteine 20% 57m TID Dispense 270 ml ? ?Please let me know if you have questions. ?Thank you, ?Melissa  ? ? ? ?Melissa Stenson ? ?Account Manager, [facility]? ? ?Dr. RChase Callerplease advise ?

## 2021-04-02 NOTE — Telephone Encounter (Signed)
I would have her to the acetylchystein BID just for a week. I have to see a) if it works and b) safer to take for longer periods. For now just 1 week fine at bid ?

## 2021-04-02 NOTE — Telephone Encounter (Signed)
Tried to call patient and she did not answer. Left a voicemail for patient to call us back.  ?

## 2021-04-03 NOTE — Telephone Encounter (Signed)
Lm x1 for patient.  

## 2021-04-04 MED ORDER — SODIUM CHLORIDE 3 % IN NEBU: INHALATION_SOLUTION | RESPIRATORY_TRACT | 3 refills | Status: DC | PRN

## 2021-04-04 NOTE — Telephone Encounter (Signed)
Mychart message sent to patient as requested.

## 2021-04-04 NOTE — Telephone Encounter (Signed)
Patient is returning phone call. Patient phone number is [phone]. Would like message sent thru mychart.  ?

## 2021-04-04 NOTE — Telephone Encounter (Signed)
RX her Hypertonic nebs have been sent to [facility] pharmacy. Nothing further needed at this time.  ?

## 2021-04-13 ENCOUNTER — Encounter: Payer: Self-pay | Admitting: Internal Medicine

## 2021-04-13 NOTE — Telephone Encounter (Signed)
Dr. Chase Caller, ?Please see patient response regarding nebulizer solution and advise.  Please route back to triage.  Thank you. ?

## 2021-04-16 NOTE — Telephone Encounter (Signed)
I am getting word there is Producer, television/film/video. So, yes she should get that from [facility]  as long as she can afford. She can then try and see if it works for her or not ?

## 2021-04-17 MED ORDER — SODIUM CHLORIDE 3 % IN NEBU: INHALATION_SOLUTION | Freq: Every day | RESPIRATORY_TRACT | 3 refills | Status: AC

## 2021-04-17 NOTE — Telephone Encounter (Signed)
I sent rx to [facility]  ?I called and left VM for the pt to be made aware  ?

## 2021-04-19 ENCOUNTER — Encounter: Payer: Self-pay | Admitting: Cardiology

## 2021-04-19 ENCOUNTER — Ambulatory Visit (INDEPENDENT_AMBULATORY_CARE_PROVIDER_SITE_OTHER): Payer: Medicare Other | Admitting: Cardiology

## 2021-04-19 VITALS — BP 118/62 | HR 84 | Ht 66.0 in | Wt 146.4 lb

## 2021-04-19 DIAGNOSIS — I700 Atherosclerosis of aorta: Secondary | ICD-10-CM | POA: Diagnosis not present

## 2021-04-19 DIAGNOSIS — M542 Cervicalgia: Secondary | ICD-10-CM

## 2021-04-19 DIAGNOSIS — I480 Paroxysmal atrial fibrillation: Secondary | ICD-10-CM

## 2021-04-19 DIAGNOSIS — I209 Angina pectoris, unspecified: Secondary | ICD-10-CM | POA: Diagnosis not present

## 2021-04-19 DIAGNOSIS — Z01812 Encounter for preprocedural laboratory examination: Secondary | ICD-10-CM

## 2021-04-19 DIAGNOSIS — J439 Emphysema, unspecified: Secondary | ICD-10-CM

## 2021-04-19 MED ORDER — METOPROLOL TARTRATE 100 MG PO TABS: 100.0000 mg | ORAL_TABLET | ORAL | 0 refills | Status: AC

## 2021-04-19 NOTE — Patient Instructions (Addendum)
Medication Instructions:  ?The current medical regimen is effective;  continue present plan and medications. ? ?*If you need a refill on your cardiac medications before your next appointment, please call your pharmacy* ? ? ?Lab Work: ?Please have blood work today (BMP) ? ?If you have labs (blood work) drawn today and your tests are completely normal, you will receive your results only by: ?MyChart Message (if you have MyChart) OR ?A paper copy in the mail ?If you have any lab test that is abnormal or we need to change your treatment, we will call you to review the results. ? ? ?Testing/Procedures: ?Your physician has requested that you have a carotid duplex. This test is an ultrasound of the carotid arteries in your neck. It looks at blood flow through these arteries that supply the brain with blood. Allow one hour for this exam. There are no restrictions or special instructions. ? ? ? ?Your cardiac CT will be scheduled at:  ? ?[facility] ?[address] ?[place], [place] [address] ?[phone] ? ?Please arrive at the [facility] and [facility] Entrance (Entrance C2) of [facility] 30 minutes prior to test start time. ?You can use the FREE valet parking offered at entrance C (encouraged to control the heart rate for the test)  ?Proceed to the [facility] Radiology Department (first floor) to check-in and test prep. ? ?All radiology patients and guests should use entrance C2 at [facility], accessed from [facility], even though the hospital's physical address listed is [address]. ? ? ? ?Please follow these instructions carefully (unless otherwise directed): ? ?On the Night Before the Test: ?Be sure to Drink plenty of water. ?Do not consume any caffeinated/decaffeinated beverages or chocolate 12 hours prior to your test. ?Do not take any antihistamines 12 hours prior to your test. ? ?On the Day of the Test: ?Drink plenty of water until 1 hour prior to the test. ?Do  not eat any food 4 hours prior to the test. ?You may take your regular medications prior to the test.  ?Take metoprolol (Lopressor) two hours prior to test. ?HOLD Furosemide/Hydrochlorothiazide morning of the test. ?FEMALES- please wear underwire-free bra if available, avoid dresses & tight clothing ? ?After the Test: ?Drink plenty of water. ?After receiving IV contrast, you may experience a mild flushed feeling. This is normal. ?On occasion, you may experience a mild rash up to 24 hours after the test. This is not dangerous. If this occurs, you can take Benadryl 25 mg and increase your fluid intake. ?If you experience trouble breathing, this can be serious. If it is severe call 911 IMMEDIATELY. If it is mild, please call our office. ?If you take any of these medications: Glipizide/Metformin, Avandament, Glucavance, please do not take 48 hours after completing test unless otherwise instructed. ? ?We will call to schedule your test 2-4 weeks out understanding that some insurance companies will need an authorization prior to the service being performed.  ? ?For non-scheduling related questions, please contact the cardiac imaging nurse navigator should you have any questions/concerns: ?Marchia Bond, Cardiac Imaging Nurse Navigator ?Gordy Clement, Cardiac Imaging Nurse Navigator ?[facility] ?Direct Office Dial: [phone]  ? ?For scheduling needs, including cancellations and rescheduling, please call [place], [phone]. ? ? ? ?Follow-Up: ?At [facility], you and your health needs are our priority.  As part of our continuing mission to provide you with exceptional heart care, we have created designated Provider Care Teams.  These Care Teams include your primary Cardiologist (physician) and Advanced Practice Providers (APPs -  Physician Assistants and Nurse Practitioners) who all work together to provide you with the care you need, when you need it. ? ?We recommend signing up for the  patient portal called "MyChart".  Sign up information is provided on this After Visit Summary.  MyChart is used to connect with patients for Virtual Visits (Telemedicine).  Patients are able to view lab/test results, encounter notes, upcoming appointments, etc.  Non-urgent messages can be sent to your provider as well.   ?To learn more about what you can do with MyChart, go to [email or web address].   ? ?Your next appointment:   ?6 month(s) ? ?The format for your next appointment:   ?In Person ? ?Provider:   ?Nicholes Rough, PA-C, Melina Copa, PA-C, Ermalinda Barrios, PA-C, Christen Bame, NP, or Richardson Dopp, PA-C       ? ? ?Thank you for choosing [facility]!! ? ? ? ?

## 2021-04-19 NOTE — Progress Notes (Signed)
?Cardiology Office Note:   ? ?Date:  04/19/2021  ? ?ID:  Bridget Mcdonald, DOB [birth year], MRN [id number] ? ?PCP:  Aretta Nip, MD  ?[facility] Cardiologist:  Candee Furbish, MD  ?[facility] Electrophysiologist:  None  ? ?Referring MD: Aretta Nip, MD  ? ? ? ?History of Present Illness:   ? ?Bridget Mcdonald is a 76 y.o. female here for the follow-up of paroxysmal atrial fibrillation and hypertension. ? ?Has seen Roderic Palau.  Originally hesitant to utilize anticoagulation. ? ?Negative stress test Holter monitor showed frequent PVCs.  Had been worked up previously for temporal arteritis. ? ?Patient is felt when laying down at night.  Definitely had atrial fibrillation in the past.  Takes diltiazem if needed. ? ?Has been worked up for hemoptysis with pulmonary.  CT scan as small focal aortic calcifications. ? ?Today: ?Overall, she is not feeling well. ? ?Last night she had another episode of chest discomfort. She describes this as a "Shattered glass feeling", with pain radiating from her bilateral carotids to her chest, as well as superiorly to her jaw and bilateral ears. The pain is also worse with inhalation. Associated symptoms include feeling a "heart stop" and near-syncope. Typically these episodes occur only very rarely when she sits up. But they occur every once in a while when lying down. After a time her symptoms resolve spontaneously, but she then develops dental pain. ? ?She denies any shortness of breath, or peripheral edema. No lightheadedness, headaches, orthopnea, or PND. ? ? ?Past Medical History:  ?Diagnosis Date  ? Acid reflux   ? Allergy history unknown   ? Asthma, mild intermittent   ? Barrett's esophagus   ? dx'd [place]  ? Cancer [facility])   ? skin, breast (left)  ? Chronic headaches   ? Cyst of left kidney   ? mult-locular Dr.Dahlstedt  ? Hematuria   ? Dr. Zannie Cove  ? High cholesterol   ? History of cardiac arrhythmia   ? History of colon polyps   ? 04/2009 [place]  unknown histology  ? Hyperlipidemia   ? Hypertension   ? Hypothyroidism   ? Hypotonia   ? [place] 09/2012  ? IBS (irritable bowel syndrome)   ? Incontinence, feces   ? 09/2012  ? Migraine headache   ? Osteopenia   ? Palpitations 12/01/2012  ? Echocardiogram - 7/13-normal EF, trace valvular lesions, mildly AI  ? PVC's (premature ventricular contractions)   ? Vertigo   ? 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 Vocal cord polyps   ? sees DR. buccini  ? ? ?Past Surgical History:  ?Procedure Laterality Date  ? APPENDECTOMY  1964  ? BREAST LUMPECTOMY Left 2005  ? COLONOSCOPY  04/2009  ? OOPHORECTOMY  1968?  ? SKIN SURGERY  08/2012  ? TONSILECTOMY, ADENOIDECTOMY, BILATERAL MYRINGOTOMY AND TUBES  1961  ? TUBAL LIGATION    ? VIDEO BRONCHOSCOPY N/A 10/07/2019  ? Procedure: VIDEO BRONCHOSCOPY WITHOUT FLUORO;  Surgeon: Brand Males, MD;  Location: [facility];  Service: Cardiopulmonary;  Laterality: N/A;  ? ? ?Current Medications: ?Current Meds  ?Medication Sig  ? albuterol (VENTOLIN HFA) 108 (90 Base) MCG/ACT inhaler Inhale 2 puffs into the lungs every 6 (six) hours as needed for wheezing or shortness of breath. DX; J43.9  ? Calcium 200 MG TABS Take 1 tablet by mouth daily.  ? diltiazem (CARDIZEM CD) 240 MG 24 hr capsule Take 1 capsule (240 mg total) by mouth daily.  ? diltiazem (CARDIZEM) 60 MG  tablet Take 1 tablet (60 mg total) by mouth as needed.  ? hyoscyamine (ANASPAZ) 0.125 MG TBDP disintergrating tablet Place 0.125 mg under the tongue every 4 (four) hours as needed (GI spasms).   ? levothyroxine (SYNTHROID) 150 MCG tablet Take 150 mcg by mouth daily before breakfast.  ? meclizine (ANTIVERT) 25 MG tablet Take 25 mg by mouth 3 (three) times daily as needed for dizziness.  ? metoprolol tartrate (LOPRESSOR) 100 MG tablet Take 1 tablet (100 mg total) by mouth as directed. Take 1 tablet 2 hrs before CT scan  ? Multiple Vitamin (MULTIVITAMIN) tablet Take 1 tablet by mouth daily.   ? potassium chloride (KLOR-CON) 10 MEQ  tablet Take 1 tablet (10 mEq total) by mouth daily.  ? rosuvastatin (CRESTOR) 20 MG tablet Take 1 tablet (20 mg total) by mouth daily.  ? sodium chloride HYPERTONIC 3 % nebulizer solution Take by nebulization daily. Use 3% saline neb, 3 ml daily  ? Tiotropium Bromide-Olodaterol 2.5-2.5 MCG/ACT AERS Inhale 2 puffs into the lungs daily.  ?  ? ?Allergies:   Demerol [meperidine], Epinephrine, Gabapentin, and Quinine derivatives  ? ?Social History  ? ?Socioeconomic History  ? Marital status: Single  ?  Spouse name: Not on file  ? Number of children: 1  ? Years of education: Post Grad  ? Highest education level: Not on file  ?Occupational History  ? 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 Smoking status: Every Day  ?  Packs/day: 1.00  ?  Years: 50.00  ?  Pack years: 50.00  ?  Types: Cigarettes  ?  Start date: 1961  ? Smokeless tobacco: Never  ? Tobacco comments:  ?  Smoking 1 ppd 03/20/2021, got down to 5 per day and then stress kicked in.  ?Vaping Use  ? Vaping Use: Never used  ?Substance and Sexual Activity  ? Alcohol use: Yes  ?  Comment: rare  ? Drug use: No  ? Sexual activity: Not on file  ?Other Topics Concern  ? Not on file  ?Social History Narrative  ? Lives at home with herself.  ? Caffeine use: 2 cups per day.  ? Right-handed  ? ?Social Determinants of Health  ? ?Financial Resource Strain: Not on file  ?Food Insecurity: Not on file  ?Transportation Needs: Not on file  ?Physical Activity: Not on file  ?Stress: Not on file  ?Social Connections: Not on file  ?  ? ?Family History: ?The patient's family history includes Bladder Cancer in an other family member; Cancer in her brother and father; Emphysema in her mother; Heart disease in her paternal grandfather. ? ?ROS:   ?Please see the history of present illness.    ?(+) Chest/Neck discomfort ?(+) Near-syncope ?(+) Dental pain ?All other systems reviewed and are negative. ? ?EKGs/Labs/Other Studies Reviewed:   ? ?The following studies were reviewed  today: ? ?  ?CT Chest 03/05/2021: ?FINDINGS: ?Evaluation of this exam is limited in the absence of intravenous ?contrast. ?  ?Cardiovascular: There is no cardiomegaly or pericardial effusion. ?Mild atherosclerotic calcification of the thoracic aorta. No ?aneurysmal dilatation. Mildly dilated pulmonary arteries suggestive ?of pulmonary hypertension. ?  ?Mediastinum/Nodes: No hilar or mediastinal adenopathy. The esophagus ?and the thyroid gland are grossly unremarkable. No mediastinal fluid ?collection. ?  ?Lungs/Pleura: Background of centrilobular emphysema. Linear scarring ?in the lingula. Areas of reticular nodularity along the ?posteromedial subpleural right upper lobe (28/6) appears similar to ?prior CT. Interval resolution of the previously seen  nodule in the ?right upper lobe, seen on image 49/9 of the prior CT. Additional ?nodules seen on the prior CT involving the right lung base are not ?visualized on today's exam. Interval decrease in the left lower lobe ?nodule previously measured 6 mm on image 107/9 of the prior CT now ?measuring approximately 4 mm (104/6). Additional subpleural nodule ?in the left lower lobe seen on the prior CT has resolved on today's ?exam. Significant interval decrease in the size of the previously ?seen 7 mm nodule in the left upper lobe now measuring approximately ?2 mm (82/6). No focal consolidation, pleural effusion, or ?pneumothorax. The central airways are patent. ?  ?Upper Abdomen: Similar appearance of bilateral adrenal ?thickening/hyperplasia. Partially visualized multilobulated and ?septated appearing cyst in the upper pole of the left kidney with ?linear septal calcification. Similar appearance of a curvilinear ?hypodense area in the dome of the liver. ?  ?Musculoskeletal: No acute osseous pathology. ?  ?IMPRESSION: ?1. Interval decrease in the size of the previously seen bilateral ?pulmonary nodules. No new or enlarging pulmonary nodules. ?2. Mildly dilated pulmonary  arteries suggestive of pulmonary ?hypertension. ?3. Aortic Atherosclerosis (ICD10-I70.0) and Emphysema (ICD10-J43.9). ? ?Echo 06/21/2020: ?1. Left ventricular ejection fraction, by estimation, is 60 to 65%. The  ?l

## 2021-04-19 NOTE — Assessment & Plan Note (Signed)
Noted on CT scan.  Continue with Crestor 20 mg.  Last LDL 67.  Excellent.  Hemoglobin A1c 5.8 creatinine 0.7 ?

## 2021-04-19 NOTE — Assessment & Plan Note (Signed)
Appreciate pulmonary clinic.  Medications reviewed. ?

## 2021-04-19 NOTE — Assessment & Plan Note (Signed)
Checking carotid Dopplers. ?

## 2021-04-19 NOTE — Assessment & Plan Note (Signed)
Jaw and chest pain periodically. Will check a coronary CT. 1 to make sure she does not have any flow-limiting coronary artery disease that is causing the symptoms. ?

## 2021-04-26 LAB — BASIC METABOLIC PANEL
BUN/Creatinine Ratio: 15 (ref 12–28)
BUN: 13 mg/dL (ref 8–27)
CO2: 19 mmol/L — ABNORMAL LOW (ref 20–29)
Calcium: 9.7 mg/dL (ref 8.7–10.3)
Chloride: 103 mmol/L (ref 96–106)
Creatinine, Ser: 0.85 mg/dL (ref 0.57–1.00)
Glucose: 111 mg/dL — ABNORMAL HIGH (ref 70–99)
Potassium: 4.5 mmol/L (ref 3.5–5.2)
Sodium: 144 mmol/L (ref 134–144)
eGFR: 71 mL/min/{1.73_m2} (ref >59)

## 2021-05-03 ENCOUNTER — Telehealth (HOSPITAL_COMMUNITY): Payer: Self-pay | Admitting: Emergency Medicine

## 2021-05-03 NOTE — Telephone Encounter (Signed)
Attempted to call patient regarding upcoming cardiac CT appointment. °Left message on voicemail with name and callback number °Damaris Abeln RN Navigator Cardiac Imaging °[facility] °[phone] Office °[phone] Cell ° °

## 2021-05-04 ENCOUNTER — Ambulatory Visit (HOSPITAL_COMMUNITY): Payer: Medicare Other

## 2021-05-09 ENCOUNTER — Ambulatory Visit (HOSPITAL_COMMUNITY)
Admission: RE | Admit: 2021-05-09 | Payer: Medicare Other | Source: Ambulatory Visit | Attending: Cardiology | Admitting: Cardiology

## 2021-05-14 ENCOUNTER — Ambulatory Visit (HOSPITAL_COMMUNITY): Payer: Medicare Other

## 2021-05-14 ENCOUNTER — Encounter (HOSPITAL_COMMUNITY): Payer: Self-pay

## 2021-05-14 ENCOUNTER — Other Ambulatory Visit: Payer: Self-pay | Admitting: *Deleted

## 2021-05-14 MED ORDER — ROSUVASTATIN CALCIUM 20 MG PO TABS: 20.0000 mg | ORAL_TABLET | Freq: Every day | ORAL | 3 refills | Status: DC

## 2021-05-14 MED ORDER — DILTIAZEM HCL ER COATED BEADS 240 MG PO CP24: 240.0000 mg | ORAL_CAPSULE | Freq: Every day | ORAL | 3 refills | Status: DC

## 2021-05-14 MED ORDER — POTASSIUM CHLORIDE ER 10 MEQ PO TBCR: 10.0000 meq | EXTENDED_RELEASE_TABLET | Freq: Every day | ORAL | 3 refills | Status: DC

## 2021-05-15 ENCOUNTER — Inpatient Hospital Stay (HOSPITAL_COMMUNITY): Admission: RE | Admit: 2021-05-15 | Payer: Medicare Other | Source: Ambulatory Visit

## 2021-05-15 ENCOUNTER — Ambulatory Visit (HOSPITAL_COMMUNITY): Admission: RE | Admit: 2021-05-15 | Payer: Medicare Other | Source: Ambulatory Visit

## 2021-05-21 ENCOUNTER — Ambulatory Visit: Payer: Medicare Other | Admitting: Cardiology

## 2021-06-18 ENCOUNTER — Ambulatory Visit: Payer: Medicare Other | Admitting: Internal Medicine

## 2021-08-10 ENCOUNTER — Encounter (HOSPITAL_COMMUNITY): Payer: Self-pay

## 2021-08-29 ENCOUNTER — Other Ambulatory Visit: Payer: Self-pay | Admitting: *Deleted

## 2021-08-29 MED ORDER — DILTIAZEM HCL 60 MG PO TABS: 60.0000 mg | ORAL_TABLET | ORAL | 1 refills | Status: DC | PRN

## 2021-08-30 ENCOUNTER — Encounter: Payer: Self-pay | Admitting: Internal Medicine

## 2021-08-30 IMAGING — CT CT CHEST HIGH RESOLUTION W/O CM
2 of 9 series · 13 of 36 positions shown, 16 images · non-contrast
Comparison: 08/09/2019 chest CT.

CLINICAL DATA: Recurrent hemoptysis. Dyspnea. Remote history of
left breast cancer. Current smoker. Follow-up pulmonary nodules.
History of right upper lobe opacities.

EXAM:
CT CHEST WITHOUT CONTRAST
TECHNIQUE: Multidetector CT imaging of the chest was performed following the
standard protocol without intravenous contrast. High resolution
imaging of the lungs, as well as inspiratory and expiratory imaging,
was performed.

[Series 5: chest 2.00 br36 s3 cor soft · coronal · 0.61mm/px · 3 of 174 slices shown]
[im 35/174  lung]
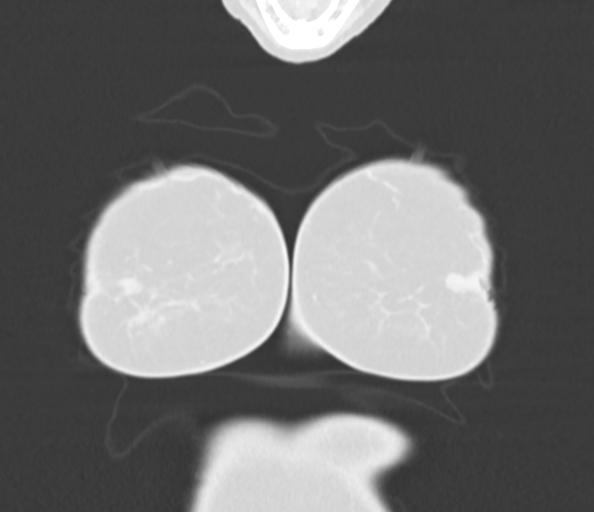
[im 70/174  lung]
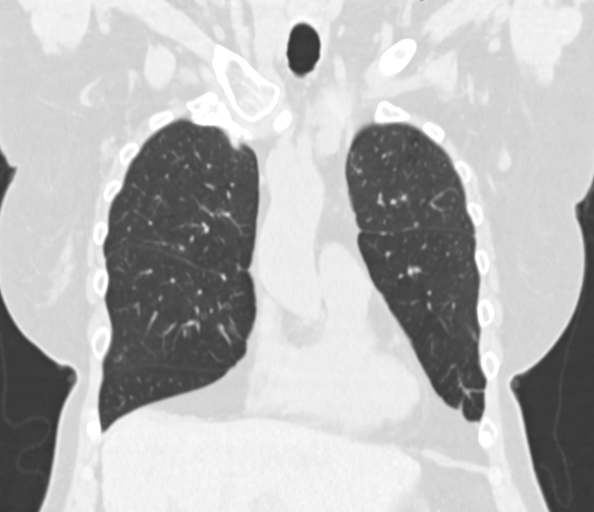
[im 104/174  lung]
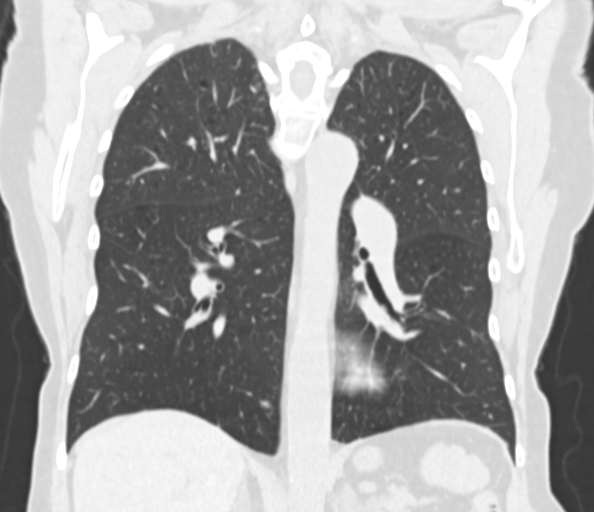

[Series 12: chest 1.00 br60 s3 high res thins 1x1 mm · axial · 0.71mm/px · z∈[+1686,+1941]mm · 10 of 313 slices shown, 13 images]
[im 29/313  mediastinal]
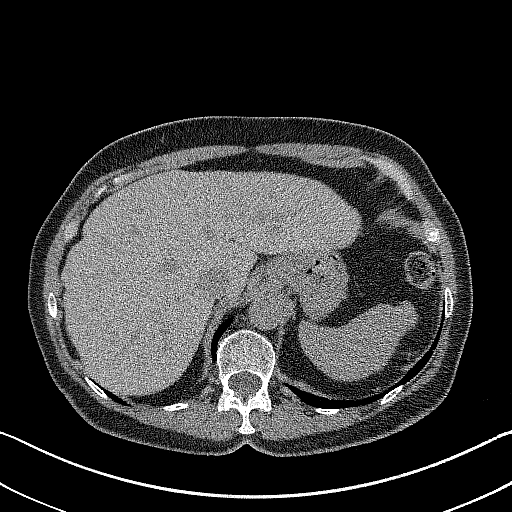
[im 29/313  lung]
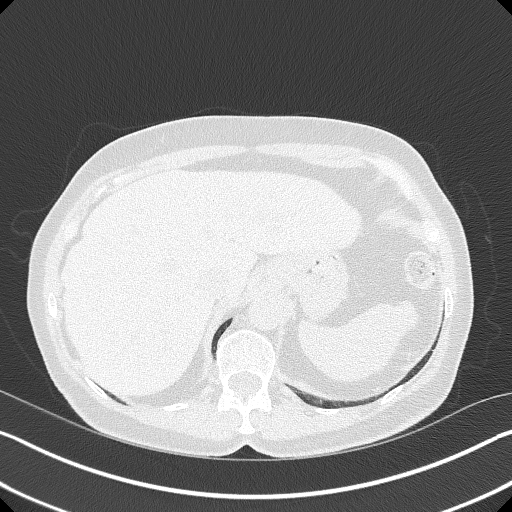
[im 57/313  lung]
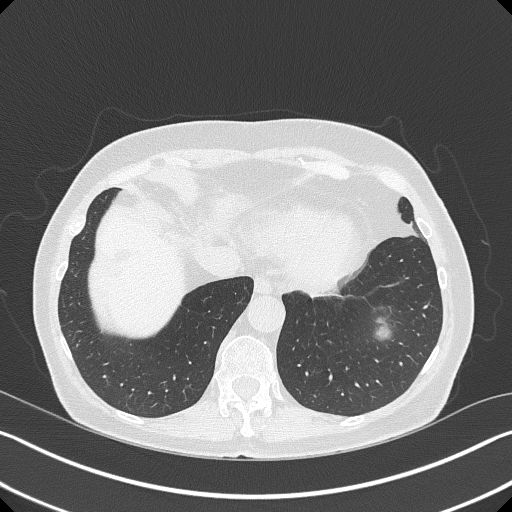
[im 86/313  lung]
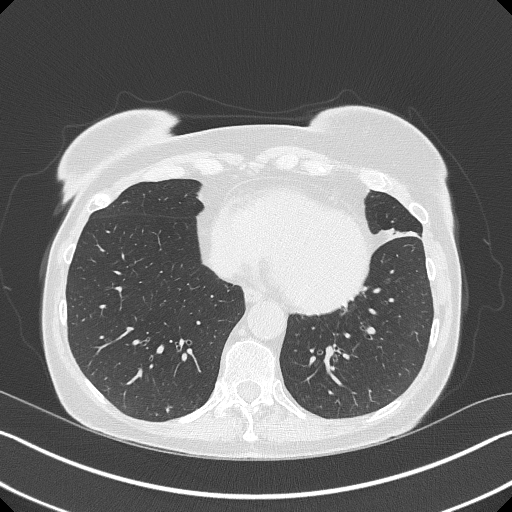
[im 114/313  lung]
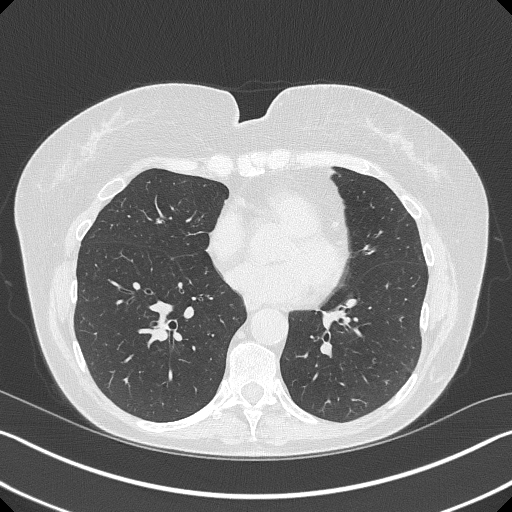
[im 142/313  mediastinal]
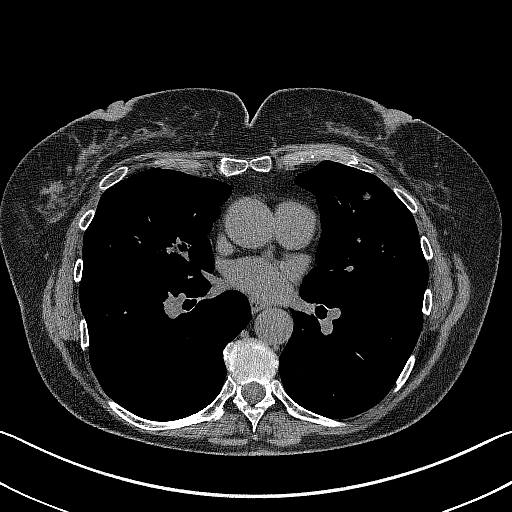
[im 142/313  lung]
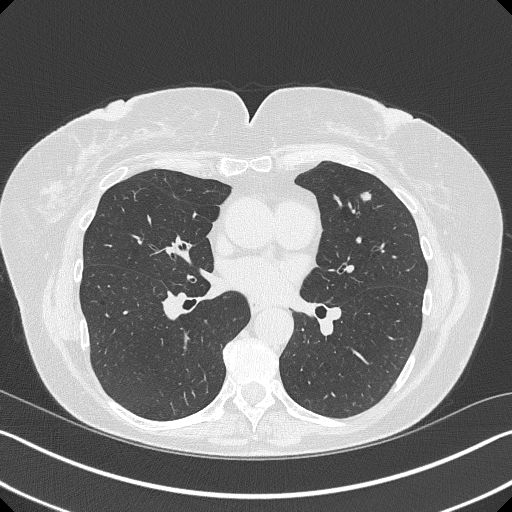
[im 171/313  lung]
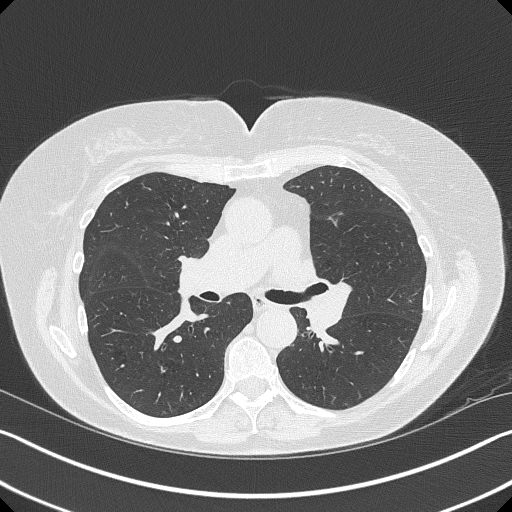
[im 199/313  lung]
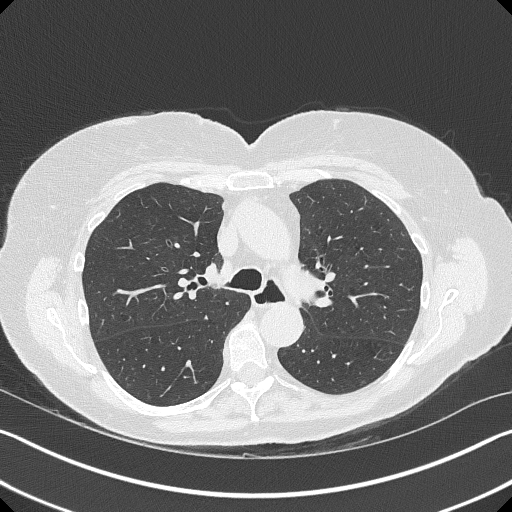
[im 227/313  lung]
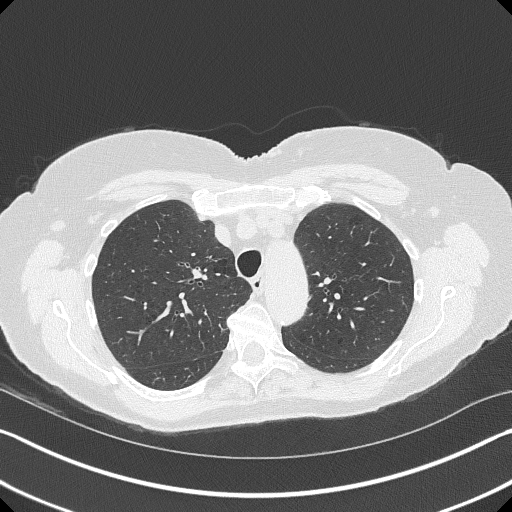
[im 256/313  mediastinal]
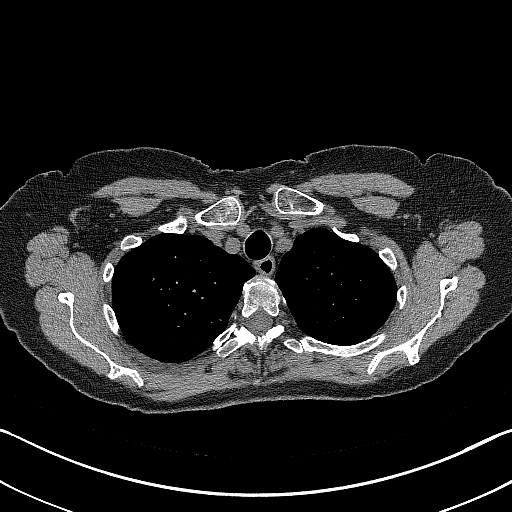
[im 256/313  lung]
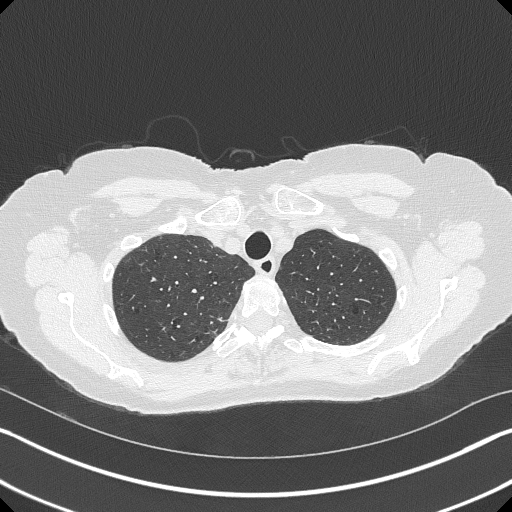
[im 284/313  lung]
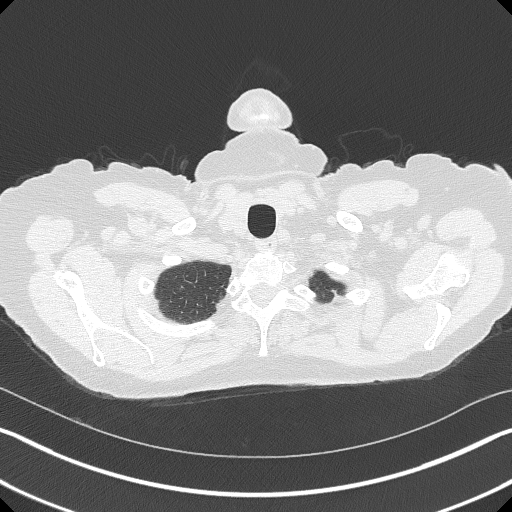

[13 of 36 positions shown; findings below may reference images not displayed]

FINDINGS: Cardiovascular: Normal heart size. No significant pericardial
effusion/thickening. Atherosclerotic nonaneurysmal thoracic aorta.
Normal caliber pulmonary arteries.

Mediastinum/Nodes: No discrete thyroid nodules. Unremarkable
esophagus. No pathologically enlarged axillary, mediastinal or hilar
lymph nodes, noting limited sensitivity for the detection of hilar
adenopathy on this noncontrast study.

Lungs/Pleura: No pneumothorax. No pleural effusion. Mild-to-moderate
centrilobular emphysema. Previously visualized tiny right middle
lobe and right lower lobe pulmonary nodules on 08/09/2019 chest CT
study have resolved. Several (at least 10) new indistinct pulmonary
nodules scattered throughout both lungs, for example measuring
cm in the posterior right lower lobe (series 9/image 108), 0.7 cm in
the anterior left upper lobe (series 9/image 86) and 0.6 cm in the
left lower lobe (series 9/image 107). Stable focal subpleural mild
curvilinear postinfectious/postinflammatory scarring in the
posteromedial apical right upper lobe. Otherwise no significant
regions of subpleural reticulation, ground-glass opacity, traction
bronchiectasis, architectural distortion or frank honeycombing. No
significant lobular air trapping or evidence of
tracheobronchomalacia on the expiration sequence.

Upper abdomen: Stable mild bilateral adrenal hyperplasia without
discrete adrenal nodules. Lobulated 5.3 cm mildly complex upper left
renal cyst with mildly thickened septal and mural calcification,
unchanged.

Musculoskeletal:  No aggressive appearing focal osseous lesions.
IMPRESSION: 1. Waxing and waning pulmonary nodularity. Previously visualized
tiny right pulmonary nodules have resolved. Several (at least 10)
new indistinct pulmonary nodules, largest 0.8 cm in the right lower
lobe. Differential includes Langerhans cell histiocytosis,
granulomatosis with polyangiitis (GPA) or other pulmonary
vasculitides, and rheumatoid nodules. Suggest smoking cessation and
follow-up chest CT in 3-6 months given patient risk factors.
2. No evidence of interstitial lung disease.
3. Stable mild bilateral adrenal hyperplasia without discrete
adrenal nodules.
4. Aortic Atherosclerosis (J4XF0-8HG.G) and Emphysema ([id number].Q).

## 2021-08-31 NOTE — Telephone Encounter (Signed)
OTC mucinex. Is she getting her 3% saline at [facility]

## 2021-09-04 ENCOUNTER — Other Ambulatory Visit: Payer: Medicare Other

## 2022-06-03 ENCOUNTER — Other Ambulatory Visit: Payer: Self-pay | Admitting: *Deleted

## 2022-06-03 MED ORDER — ROSUVASTATIN CALCIUM 20 MG PO TABS: 20.0000 mg | ORAL_TABLET | Freq: Every day | ORAL | 1 refills | Status: DC

## 2022-06-03 MED ORDER — POTASSIUM CHLORIDE ER 10 MEQ PO TBCR: 10.0000 meq | EXTENDED_RELEASE_TABLET | Freq: Every day | ORAL | 1 refills | Status: DC

## 2022-06-06 ENCOUNTER — Other Ambulatory Visit: Payer: Self-pay

## 2022-06-06 MED ORDER — DILTIAZEM HCL 60 MG PO TABS: 60.0000 mg | ORAL_TABLET | ORAL | 5 refills | Status: DC | PRN

## 2022-06-06 MED ORDER — DILTIAZEM HCL ER COATED BEADS 240 MG PO CP24: 240.0000 mg | ORAL_CAPSULE | Freq: Every day | ORAL | 1 refills | Status: DC

## 2022-06-06 NOTE — Telephone Encounter (Signed)
Pt's medications were sent to pt's pharmacy as requested. Confirmation received.  

## 2022-07-01 DIAGNOSIS — K589 Irritable bowel syndrome without diarrhea: Secondary | ICD-10-CM | POA: Diagnosis not present

## 2022-07-01 DIAGNOSIS — F17200 Nicotine dependence, unspecified, uncomplicated: Secondary | ICD-10-CM | POA: Diagnosis not present

## 2022-07-01 DIAGNOSIS — R413 Other amnesia: Secondary | ICD-10-CM | POA: Diagnosis not present

## 2022-07-01 DIAGNOSIS — R7303 Prediabetes: Secondary | ICD-10-CM | POA: Diagnosis not present

## 2022-07-01 DIAGNOSIS — E7800 Pure hypercholesterolemia, unspecified: Secondary | ICD-10-CM | POA: Diagnosis not present

## 2022-07-01 DIAGNOSIS — J449 Chronic obstructive pulmonary disease, unspecified: Secondary | ICD-10-CM | POA: Diagnosis not present

## 2022-07-01 DIAGNOSIS — R300 Dysuria: Secondary | ICD-10-CM | POA: Diagnosis not present

## 2022-07-01 DIAGNOSIS — I10 Essential (primary) hypertension: Secondary | ICD-10-CM | POA: Diagnosis not present

## 2022-07-01 DIAGNOSIS — E039 Hypothyroidism, unspecified: Secondary | ICD-10-CM | POA: Diagnosis not present

## 2022-07-01 DIAGNOSIS — I480 Paroxysmal atrial fibrillation: Secondary | ICD-10-CM | POA: Diagnosis not present

## 2022-07-01 DIAGNOSIS — Z0000 Encounter for general adult medical examination without abnormal findings: Secondary | ICD-10-CM | POA: Diagnosis not present

## 2022-07-01 DIAGNOSIS — E559 Vitamin D deficiency, unspecified: Secondary | ICD-10-CM | POA: Diagnosis not present

## 2022-10-17 ENCOUNTER — Ambulatory Visit: Payer: Medicare Other | Admitting: Cardiology

## 2022-12-16 ENCOUNTER — Other Ambulatory Visit: Payer: Self-pay

## 2022-12-16 MED ORDER — DILTIAZEM HCL ER COATED BEADS 240 MG PO CP24: 240.0000 mg | ORAL_CAPSULE | Freq: Every day | ORAL | 1 refills | Status: DC

## 2022-12-16 MED ORDER — ROSUVASTATIN CALCIUM 20 MG PO TABS: 20.0000 mg | ORAL_TABLET | Freq: Every day | ORAL | 1 refills | Status: DC

## 2022-12-16 MED ORDER — POTASSIUM CHLORIDE ER 10 MEQ PO TBCR: 10.0000 meq | EXTENDED_RELEASE_TABLET | Freq: Every day | ORAL | 1 refills | Status: DC

## 2023-01-24 ENCOUNTER — Ambulatory Visit (HOSPITAL_BASED_OUTPATIENT_CLINIC_OR_DEPARTMENT_OTHER): Payer: Medicare Other | Admitting: Cardiology

## 2023-03-30 ENCOUNTER — Other Ambulatory Visit: Payer: Self-pay | Admitting: Cardiology

## 2023-04-11 ENCOUNTER — Ambulatory Visit (HOSPITAL_BASED_OUTPATIENT_CLINIC_OR_DEPARTMENT_OTHER): Payer: Medicare Other | Admitting: Family

## 2023-04-15 DIAGNOSIS — F17200 Nicotine dependence, unspecified, uncomplicated: Secondary | ICD-10-CM | POA: Diagnosis not present

## 2023-04-15 DIAGNOSIS — E7800 Pure hypercholesterolemia, unspecified: Secondary | ICD-10-CM | POA: Diagnosis not present

## 2023-04-15 DIAGNOSIS — K589 Irritable bowel syndrome without diarrhea: Secondary | ICD-10-CM | POA: Diagnosis not present

## 2023-04-15 DIAGNOSIS — E559 Vitamin D deficiency, unspecified: Secondary | ICD-10-CM | POA: Diagnosis not present

## 2023-04-15 DIAGNOSIS — I10 Essential (primary) hypertension: Secondary | ICD-10-CM | POA: Diagnosis not present

## 2023-04-15 DIAGNOSIS — J449 Chronic obstructive pulmonary disease, unspecified: Secondary | ICD-10-CM | POA: Diagnosis not present

## 2023-04-15 DIAGNOSIS — R7303 Prediabetes: Secondary | ICD-10-CM | POA: Diagnosis not present

## 2023-04-15 DIAGNOSIS — I480 Paroxysmal atrial fibrillation: Secondary | ICD-10-CM | POA: Diagnosis not present

## 2023-04-15 DIAGNOSIS — F331 Major depressive disorder, recurrent, moderate: Secondary | ICD-10-CM | POA: Diagnosis not present

## 2023-04-15 DIAGNOSIS — G479 Sleep disorder, unspecified: Secondary | ICD-10-CM | POA: Diagnosis not present

## 2023-04-15 DIAGNOSIS — K219 Gastro-esophageal reflux disease without esophagitis: Secondary | ICD-10-CM | POA: Diagnosis not present

## 2023-04-15 DIAGNOSIS — E039 Hypothyroidism, unspecified: Secondary | ICD-10-CM | POA: Diagnosis not present

## 2023-04-24 ENCOUNTER — Ambulatory Visit (INDEPENDENT_AMBULATORY_CARE_PROVIDER_SITE_OTHER): Admitting: Cardiology

## 2023-04-24 ENCOUNTER — Other Ambulatory Visit (HOSPITAL_BASED_OUTPATIENT_CLINIC_OR_DEPARTMENT_OTHER)

## 2023-04-24 VITALS — BP 140/62 | HR 89 | Ht 66.0 in | Wt 124.6 lb

## 2023-04-24 DIAGNOSIS — I480 Paroxysmal atrial fibrillation: Secondary | ICD-10-CM

## 2023-04-24 DIAGNOSIS — I700 Atherosclerosis of aorta: Secondary | ICD-10-CM | POA: Diagnosis not present

## 2023-04-24 DIAGNOSIS — I10 Essential (primary) hypertension: Secondary | ICD-10-CM | POA: Diagnosis not present

## 2023-04-24 NOTE — Patient Instructions (Signed)
 Medication Instructions:  Your physician recommends that you continue on your current medications as directed. Please refer to the Current Medication list given to you today.  Testing/Procedures: Your physician has recommended that you wear a Zio monitor.   This monitor is a medical device that records the heart's electrical activity. Doctors most often use these monitors to diagnose arrhythmias. Arrhythmias are problems with the speed or rhythm of the heartbeat. The monitor is a small device applied to your chest. You can wear one while you do your normal daily activities. While wearing this monitor if you have any symptoms to push the button and record what you felt. Once you have worn this monitor for the period of time provider prescribed (Usually 14 days), you will return the monitor device in the postage paid box. Once it is returned they will download the data collected and provide [place] with a report which the provider will then review and we will call you with those results. Important tips:  Avoid showering during the first 24 hours of wearing the monitor. Avoid excessive sweating to help maximize wear time. Do not submerge the device, no hot tubs, and no swimming pools. Keep any lotions or oils away from the patch. After 24 hours you may shower with the patch on. Take brief showers with your back facing the shower head.  Do not remove patch once it has been placed because that will interrupt data and decrease adhesive wear time. Push the button when you have any symptoms and write down what you were feeling. Once you have completed wearing your monitor, remove and place into box which has postage paid and place in your outgoing mailbox.  If for some reason you have misplaced your box then call our office and we can provide another box and/or mail it off for you.  Follow-Up: At [facility], you and your health needs are our priority.  As part of our continuing mission to provide  you with exceptional heart care, our providers are all part of one team.  This team includes your primary Cardiologist (physician) and Advanced Practice Providers or APPs (Physician Assistants and Nurse Practitioners) who all work together to provide you with the care you need, when you need it.  Your next appointment:   6 months with APP

## 2023-04-24 NOTE — Progress Notes (Signed)
 Cardiology Office Note:  .   Date:  04/24/2023  ID:  Bridget Mcdonald, DOB Nov 02, 1945, MRN [id number] PCP: Clayborn Heron, MD  [facility] Providers Cardiologist:  Donato Schultz, MD     History of Present Illness: .   Bridget Mcdonald is a 78 y.o. female Discussed the use of AI scribe software for clinical note transcription with the patient, who gave verbal consent to proceed.  History of Present Illness Bridget Mcdonald is a 78 year old female with paroxysmal atrial fibrillation who presents for follow-up.  She experiences episodes of arrhythmias, describing them as 'really fast for two or three beats' followed by irregular rhythms. These episodes occurred yesterday morning and lasted about two to three hours. She considered taking an additional dose of Cardizem but the symptoms subsided after she ate, although eating initially worsened the symptoms. She managed the episode by resting and sleeping for about four hours, after which she felt better.  She has a history of paroxysmal atrial fibrillation and has been seen by the AFib clinic in the past. A previous Holter monitor showed frequent PVCs. She has been worked up for temporal arteritis and hemoptysis, with a prior CT scan revealing small focal aortic atherosclerosis. A bubble study in 2022 was normal except for grade one diastolic dysfunction. She mentions that she rarely experiences a fast pulse even during fibrillation episodes, and her pulse is usually 'very smooth and slow.' She has been prescribed Cardizem for her condition.  She reports significant limitations in her physical activity due to weakness and fatigue, stating she can only manage about five minutes of activity before needing to rest. She has lost a significant amount of weight and finds daily activities, such as showering and grocery shopping, exhausting. She also mentions feeling depressed due to her limited activity level.  She has been trying to quit  smoking, acknowledging the difficulty due to her heart condition. She expresses frustration with the cost of medications like Eliquis, which she cannot afford due to lack of insurance coverage.      ROS: No bleeding.   Studies Reviewed: Marland Kitchen   EKG Interpretation Date/Time:  Thursday April 24 2023 08:35:36 EDT Ventricular Rate:  86 PR Interval:  128 QRS Duration:  96 QT Interval:  402 QTC Calculation: 481 R Axis:   66  Text Interpretation: Normal sinus rhythm Possible Left atrial enlargement Incomplete right bundle branch block Nonspecific ST abnormality When compared with ECG of 28-Feb-2016 04:04, No significant change since last tracing Confirmed by Donato Schultz ([id number]) on 04/24/2023 8:55:02 AM    Results RADIOLOGY CT scan: Aortic atherosclerosis, small focal  DIAGNOSTIC Stress test: Negative Holter monitor: Frequent PVCs Bubble study: Normal except for grade one diastolic dysfunction (2022) EKG: Normal rhythm (04/24/2023) Risk Assessment/Calculations:           Physical Exam:   VS:  BP (!) 140/62   Pulse 89   Ht 5\' 6"  (1.676 m)   Wt 124 lb 9.6 oz (56.5 kg)   LMP  (LMP Unknown)   SpO2 94%   BMI 20.11 kg/m    Wt Readings from Last 3 Encounters:  04/24/23 124 lb 9.6 oz (56.5 kg)  04/19/21 146 lb 6.4 oz (66.4 kg)  03/20/21 146 lb (66.2 kg)    GEN: Well nourished, well developed in no acute distress NECK: No JVD; No carotid bruits CARDIAC: RRR, no murmurs, no rubs, no gallops RESPIRATORY:  Clear to auscultation without rales, wheezing or rhonchi  ABDOMEN: Soft, non-tender, non-distended EXTREMITIES:  No edema; No deformity   ASSESSMENT AND PLAN: .    Assessment and Plan Assessment & Plan Paroxysmal Atrial Fibrillation Episodes of paroxysmal atrial fibrillation with irregular heartbeats lasting several hours, exacerbated by eating, and sometimes accompanied by a sensation of pressure against the heart. EKG today shows normal rhythm with no atrial fibrillation.  Previously managed with Cardizem. Consideration for anticoagulation therapy with Eliquis if atrial fibrillation is confirmed, but cost is a barrier. - Order Zio monitor to record heartbeats for up to two weeks to assess for episodes of atrial fibrillation. - Discuss potential anticoagulation therapy with Eliquis if atrial fibrillation is confirmed, considering cost and insurance coverage. - Explore assistance programs for Eliquis to address cost concerns.  Frequent Premature Ventricular Contractions (PVCs) Frequent PVCs as shown on a previous Holter monitor. Evaluating clinical significance in relation to symptoms and overall cardiac health.  Aortic Atherosclerosis Prior CT scan showed small focal aortic atherosclerosis. Considering significance in relation to symptoms and overall cardiovascular risk. - Consider coronary CT scan in the future to evaluate heart arteries, pending ability to attend appointments.  Smoking Cessation She is a smoker and acknowledges difficulty in quitting, especially given her cardiac and respiratory issues. Smoking cessation is important for overall health and management of cardiovascular conditions. - Encourage continued efforts towards smoking cessation.  Generalized Weakness and Weight Loss Reports significant weight loss and generalized weakness, limiting daily activities and contributing to depression. Etiology unclear, may be multifactorial including potential cardiac and pulmonary contributions.  Follow-up Difficulty attending multiple appointments due to health condition, prefers scheduling on better days. - Schedule follow-up in six months with an Warehouse manager Provider (APP).          Signed, Donato Schultz, MD

## 2023-05-11 ENCOUNTER — Other Ambulatory Visit: Payer: Self-pay | Admitting: Cardiology

## 2023-06-25 ENCOUNTER — Telehealth: Payer: Self-pay | Admitting: Cardiology

## 2023-06-25 NOTE — Telephone Encounter (Signed)
  The patient is calling to follow up on the heart monitor results. She stated that she sent it back on 05/12/23 and has not heard anything regarding the results.

## 2023-06-25 NOTE — Telephone Encounter (Signed)
 Pt made aware that monitor was just received by the monitor company.  Aware that they just started processing today and that it will be week/two before she hears back from our office with the monitor findings. Patient agreeable to plan.

## 2023-07-01 DIAGNOSIS — I480 Paroxysmal atrial fibrillation: Secondary | ICD-10-CM | POA: Diagnosis not present

## 2023-07-14 ENCOUNTER — Encounter (HOSPITAL_BASED_OUTPATIENT_CLINIC_OR_DEPARTMENT_OTHER): Payer: Self-pay | Admitting: Cardiology

## 2023-07-15 ENCOUNTER — Other Ambulatory Visit: Payer: Self-pay

## 2023-07-15 ENCOUNTER — Ambulatory Visit: Payer: Self-pay | Admitting: Cardiology

## 2023-07-15 DIAGNOSIS — I480 Paroxysmal atrial fibrillation: Secondary | ICD-10-CM | POA: Diagnosis not present

## 2023-07-15 MED ORDER — DILTIAZEM HCL 60 MG PO TABS: 60.0000 mg | ORAL_TABLET | ORAL | 9 refills | Status: AC | PRN

## 2023-07-24 ENCOUNTER — Other Ambulatory Visit (HOSPITAL_COMMUNITY): Payer: Self-pay

## 2023-07-24 ENCOUNTER — Telehealth: Payer: Self-pay

## 2023-07-24 NOTE — Telephone Encounter (Addendum)
 Pharmacy Patient Advocate Encounter   Received notification from Physician's Office that prior authorization for ELIQUIS  is required/requested.   Insurance verification completed.   The patient is insured through [employer] ([employer]) .   Per test claim: The current 30 day co-pay is, $542.02.  No PA needed at this time. This test claim was processed through [facility]- copay amounts may vary at other pharmacies due to pharmacy/plan contracts, or as the patient moves through the different stages of their insurance plan.       Per test claim billing details, patient has a deductible to meet of $590. Patient would need to spend at least 3% of their yearly household income on out of pocket drug costs in order to qualify for PAP with BMS.   If unable to afford copay or qualify with BMS, patient may benefit from calling the customer service number on her insurance card to request a Medicare Payment Plan.

## 2023-07-24 NOTE — Telephone Encounter (Signed)
-----   Message from Nurse Sharlet F sent at 07/24/2023 10:59 AM EDT ----- Regarding: options for assistance with Eliquis  Dr Jeffrie wants this pt to be started on Eliquis  for At Fib.  He would like to see if it would be covered now, how much it would cost the patient and if she is eligible for any pt assistance.   Thank you    Atrial fibrillation detected 3% of tracing.-Average heart rate 123 bpm.  Majority of tracing is sinus rhythm with average heart rate of 74 bpm.   Recommend anticoagulation therapy.  Eliquis  in the past has been cost prohibitive.  Please refer to pharmacy team for options. Written by Oneil JAYSON Jeffrie, MD on 07/15/2023  3:46 PM EDT

## 2023-07-25 ENCOUNTER — Other Ambulatory Visit (HOSPITAL_COMMUNITY): Payer: Self-pay

## 2023-07-25 NOTE — Telephone Encounter (Signed)
 Not from test billing in this instance (some plans tell us  in billing breakdown). If deductible was met we would be able to tell. The formulary search online shows it's Tier 2 on plan so patient could call customer service with her plan to find out what tier 2 drugs should cost her once deductible has been met.

## 2023-07-25 NOTE — Telephone Encounter (Signed)
 Left message for pt to call back to discuss cost and options.

## 2023-10-27 ENCOUNTER — Ambulatory Visit (HOSPITAL_BASED_OUTPATIENT_CLINIC_OR_DEPARTMENT_OTHER): Admitting: Nurse Practitioner

## 2023-10-27 NOTE — Progress Notes (Deleted)
  Cardiology Office Note   Date:  10/27/2023  ID:  Bridget Mcdonald, DOB March 09, 1945, MRN [id number] PCP: Loretha Richerd SAUNDERS, MD  [facility] Providers Cardiologist:  Oneil Parchment, MD { Click to update primary MD,subspecialty MD or APP then REFRESH:1}    PMH Paroxysmal atrial fibrillation on chronic anticoagulation Frequent PVCs Aortic atherosclerosis Current smoker  History of palpitations, she has been seen by cardiology since at least 2014.  She had negative nuclear stress test and normal echo with Holter monitor showing frequent PVCs.  She is a retired Engineer, civil (consulting).  Seen by neurology for left-sided headaches.  MRA/MRI was negative for temporal arteritis.  She established with A-fib clinic 12/2015.  She was skeptical about starting anticoagulation but ultimately started on Eliquis  5 mg twice daily.  Bubble study in 2022 was normal except for grade 1 diastolic dysfunction.  A-fib has been managed with Cardizem .  Last cardiology clinic visit was 04/24/2023 with Dr. Parchment.  She reported episodes of feeling her heartbeat really fast for 2 or 3 beats followed by irregular rhythms.  She reported significant limitations in her physical activity due to weakness and fatigue, stating she can only manage about 5 minutes of activity before needing to rest.  She acknowledged the need to quit smoking.  She reported concerns with the cost of Eliquis .  Cardiac monitor was ordered and completed 07/15/2023 and revealed atrial fibrillation 3% with average HR 123 bpm.  Majority of tracing normal sinus rhythm with average HR 74 bpm.  Pharmacy attempted to contact patient 07/24/2023 to discuss cost of Eliquis .  History of Present Illness Bridget Mcdonald is a 78 y.o. female ***  ROS: ***  Studies Reviewed      ***  No results found for: LIPOA  Risk Assessment/Calculations {Does this patient have ATRIAL FIBRILLATION?:[id number]} No BP recorded.  {Refresh Note OR Click here to enter BP  :1}***        Physical Exam VS:  LMP  (LMP Unknown)    Wt Readings from Last 3 Encounters:  04/24/23 124 lb 9.6 oz (56.5 kg)  04/19/21 146 lb 6.4 oz (66.4 kg)  03/20/21 146 lb (66.2 kg)    GEN: Well nourished, well developed in no acute distress NECK: No JVD; No carotid bruits CARDIAC: ***RRR, no murmurs, rubs, gallops RESPIRATORY:  Clear to auscultation without rales, wheezing or rhonchi  ABDOMEN: Soft, non-tender, non-distended EXTREMITIES:  No edema; No deformity   ASSESSMENT AND PLAN ***    {Are you ordering a CV Procedure (e.g. stress test, cath, DCCV, TEE, etc)?   Press F2        :[id number]}  Dispo: ***  Signed, Rosaline Bane, NP-C

## 2023-12-10 DIAGNOSIS — I10 Essential (primary) hypertension: Secondary | ICD-10-CM | POA: Diagnosis not present

## 2023-12-10 DIAGNOSIS — E7800 Pure hypercholesterolemia, unspecified: Secondary | ICD-10-CM | POA: Diagnosis not present

## 2023-12-10 DIAGNOSIS — J449 Chronic obstructive pulmonary disease, unspecified: Secondary | ICD-10-CM | POA: Diagnosis not present

## 2023-12-10 DIAGNOSIS — R718 Other abnormality of red blood cells: Secondary | ICD-10-CM | POA: Diagnosis not present

## 2023-12-10 DIAGNOSIS — E039 Hypothyroidism, unspecified: Secondary | ICD-10-CM | POA: Diagnosis not present

## 2023-12-16 DIAGNOSIS — L858 Other specified epidermal thickening: Secondary | ICD-10-CM | POA: Diagnosis not present

## 2023-12-16 DIAGNOSIS — Z23 Encounter for immunization: Secondary | ICD-10-CM | POA: Diagnosis not present

## 2023-12-16 DIAGNOSIS — K589 Irritable bowel syndrome without diarrhea: Secondary | ICD-10-CM | POA: Diagnosis not present

## 2023-12-16 DIAGNOSIS — J449 Chronic obstructive pulmonary disease, unspecified: Secondary | ICD-10-CM | POA: Diagnosis not present

## 2023-12-16 DIAGNOSIS — R634 Abnormal weight loss: Secondary | ICD-10-CM | POA: Diagnosis not present

## 2023-12-16 DIAGNOSIS — E039 Hypothyroidism, unspecified: Secondary | ICD-10-CM | POA: Diagnosis not present

## 2023-12-16 DIAGNOSIS — F17200 Nicotine dependence, unspecified, uncomplicated: Secondary | ICD-10-CM | POA: Diagnosis not present

## 2023-12-17 ENCOUNTER — Other Ambulatory Visit (HOSPITAL_COMMUNITY): Payer: Self-pay | Admitting: Family Medicine

## 2023-12-17 DIAGNOSIS — Z87891 Personal history of nicotine dependence: Secondary | ICD-10-CM

## 2023-12-17 DIAGNOSIS — R634 Abnormal weight loss: Secondary | ICD-10-CM

## 2023-12-23 ENCOUNTER — Ambulatory Visit (HOSPITAL_COMMUNITY)

## 2023-12-30 ENCOUNTER — Ambulatory Visit (HOSPITAL_COMMUNITY)

## 2023-12-30 ENCOUNTER — Encounter (HOSPITAL_COMMUNITY): Payer: Self-pay
